# Patient Record
Sex: Male | Born: 1960 | Race: White | Hispanic: No | Marital: Married | Smoking: Never smoker
Health system: Southern US, Community
[De-identification: ages and names within clinical notes are randomized; demographics above are authoritative.]

## PROBLEM LIST (undated history)

## (undated) DIAGNOSIS — G4733 Obstructive sleep apnea (adult) (pediatric): Secondary | ICD-10-CM

## (undated) DIAGNOSIS — Z974 Presence of external hearing-aid: Secondary | ICD-10-CM

## (undated) DIAGNOSIS — Z862 Personal history of diseases of the blood and blood-forming organs and certain disorders involving the immune mechanism: Secondary | ICD-10-CM

## (undated) DIAGNOSIS — G4700 Insomnia, unspecified: Secondary | ICD-10-CM

## (undated) DIAGNOSIS — I10 Essential (primary) hypertension: Secondary | ICD-10-CM

## (undated) DIAGNOSIS — J189 Pneumonia, unspecified organism: Secondary | ICD-10-CM

## (undated) DIAGNOSIS — E119 Type 2 diabetes mellitus without complications: Secondary | ICD-10-CM

## (undated) DIAGNOSIS — K56600 Partial intestinal obstruction, unspecified as to cause: Secondary | ICD-10-CM

## (undated) DIAGNOSIS — Z789 Other specified health status: Secondary | ICD-10-CM

## (undated) DIAGNOSIS — K219 Gastro-esophageal reflux disease without esophagitis: Secondary | ICD-10-CM

## (undated) DIAGNOSIS — M1990 Unspecified osteoarthritis, unspecified site: Secondary | ICD-10-CM

## (undated) DIAGNOSIS — I4510 Unspecified right bundle-branch block: Secondary | ICD-10-CM

## (undated) DIAGNOSIS — K760 Fatty (change of) liver, not elsewhere classified: Secondary | ICD-10-CM

## (undated) DIAGNOSIS — E785 Hyperlipidemia, unspecified: Secondary | ICD-10-CM

## (undated) DIAGNOSIS — Z8719 Personal history of other diseases of the digestive system: Secondary | ICD-10-CM

## (undated) DIAGNOSIS — Z87442 Personal history of urinary calculi: Secondary | ICD-10-CM

## (undated) DIAGNOSIS — D751 Secondary polycythemia: Secondary | ICD-10-CM

## (undated) DIAGNOSIS — N400 Enlarged prostate without lower urinary tract symptoms: Secondary | ICD-10-CM

## (undated) DIAGNOSIS — G4730 Sleep apnea, unspecified: Secondary | ICD-10-CM

## (undated) DIAGNOSIS — R7989 Other specified abnormal findings of blood chemistry: Secondary | ICD-10-CM

## (undated) HISTORY — PX: WISDOM TOOTH EXTRACTION: SHX21

## (undated) HISTORY — PX: NASAL SINUS SURGERY: SHX719

## (undated) HISTORY — PX: COLONOSCOPY: SHX174

## (undated) HISTORY — PX: UPPER GASTROINTESTINAL ENDOSCOPY: SHX188

## (undated) HISTORY — DX: Personal history of diseases of the blood and blood-forming organs and certain disorders involving the immune mechanism: Z86.2

---

## 2008-10-22 ENCOUNTER — Ambulatory Visit: Payer: Self-pay | Admitting: Unknown Physician Specialty

## 2009-01-10 ENCOUNTER — Ambulatory Visit: Payer: Self-pay

## 2009-01-18 ENCOUNTER — Ambulatory Visit: Payer: Self-pay

## 2009-02-14 ENCOUNTER — Ambulatory Visit: Payer: Self-pay | Admitting: Unknown Physician Specialty

## 2009-03-29 ENCOUNTER — Ambulatory Visit: Payer: Self-pay | Admitting: Unknown Physician Specialty

## 2009-04-04 ENCOUNTER — Ambulatory Visit: Payer: Self-pay | Admitting: Unknown Physician Specialty

## 2009-11-15 ENCOUNTER — Ambulatory Visit: Payer: Self-pay | Admitting: Urology

## 2010-09-08 ENCOUNTER — Ambulatory Visit: Payer: Self-pay | Admitting: Internal Medicine

## 2010-09-12 ENCOUNTER — Ambulatory Visit: Payer: Self-pay | Admitting: Internal Medicine

## 2010-09-24 ENCOUNTER — Ambulatory Visit: Payer: Self-pay | Admitting: Internal Medicine

## 2010-10-23 ENCOUNTER — Ambulatory Visit: Payer: Self-pay | Admitting: Internal Medicine

## 2010-11-20 ENCOUNTER — Emergency Department: Payer: Self-pay | Admitting: Emergency Medicine

## 2010-11-23 ENCOUNTER — Ambulatory Visit: Payer: Self-pay | Admitting: Internal Medicine

## 2010-12-23 ENCOUNTER — Ambulatory Visit: Payer: Self-pay | Admitting: Internal Medicine

## 2011-01-30 ENCOUNTER — Ambulatory Visit: Payer: Self-pay | Admitting: Internal Medicine

## 2011-02-22 ENCOUNTER — Ambulatory Visit: Payer: Self-pay | Admitting: Internal Medicine

## 2011-03-25 ENCOUNTER — Ambulatory Visit: Payer: Self-pay | Admitting: Internal Medicine

## 2011-05-29 ENCOUNTER — Ambulatory Visit: Payer: Self-pay | Admitting: Internal Medicine

## 2011-06-25 ENCOUNTER — Ambulatory Visit: Payer: Self-pay | Admitting: Internal Medicine

## 2011-09-18 ENCOUNTER — Ambulatory Visit: Payer: Self-pay | Admitting: Internal Medicine

## 2011-09-18 LAB — IRON AND TIBC
Iron Bind.Cap.(Total): 361 ug/dL (ref 250–450)
Unbound Iron-Bind.Cap.: 230 ug/dL

## 2011-09-25 ENCOUNTER — Ambulatory Visit: Payer: Self-pay | Admitting: Internal Medicine

## 2011-11-13 LAB — URINALYSIS, COMPLETE
Blood: NEGATIVE
Leukocyte Esterase: NEGATIVE
Nitrite: NEGATIVE
Ph: 5 (ref 4.5–8.0)
Protein: NEGATIVE
RBC,UR: 1 /HPF (ref 0–5)
Squamous Epithelial: NONE SEEN

## 2011-11-13 LAB — COMPREHENSIVE METABOLIC PANEL
Alkaline Phosphatase: 68 U/L (ref 50–136)
Anion Gap: 5 — ABNORMAL LOW (ref 7–16)
Bilirubin,Total: 0.7 mg/dL (ref 0.2–1.0)
Calcium, Total: 8.9 mg/dL (ref 8.5–10.1)
Chloride: 100 mmol/L (ref 98–107)
Co2: 31 mmol/L (ref 21–32)
Creatinine: 1.16 mg/dL (ref 0.60–1.30)
EGFR (Non-African Amer.): >60
Glucose: 100 mg/dL — ABNORMAL HIGH (ref 65–99)
Osmolality: 271 (ref 275–301)
Potassium: 4.1 mmol/L (ref 3.5–5.1)
Sodium: 136 mmol/L (ref 136–145)
Total Protein: 8.1 g/dL (ref 6.4–8.2)

## 2011-11-13 LAB — CBC
HCT: 55.1 % — ABNORMAL HIGH (ref 40.0–52.0)
HGB: 18.7 g/dL — ABNORMAL HIGH (ref 13.0–18.0)
MCV: 92 fL (ref 80–100)
Platelet: 194 10*3/uL (ref 150–440)
RBC: 6.01 10*6/uL — ABNORMAL HIGH (ref 4.40–5.90)
RDW: 13.1 % (ref 11.5–14.5)
WBC: 7.7 10*3/uL (ref 3.8–10.6)

## 2011-11-14 ENCOUNTER — Inpatient Hospital Stay: Payer: Self-pay | Admitting: Surgery

## 2011-11-23 ENCOUNTER — Ambulatory Visit: Payer: Self-pay | Admitting: Surgery

## 2012-01-08 ENCOUNTER — Ambulatory Visit: Payer: Self-pay | Admitting: Internal Medicine

## 2012-01-08 LAB — IRON AND TIBC
Iron: 73 ug/dL (ref 65–175)
Unbound Iron-Bind.Cap.: 245 ug/dL

## 2012-01-08 LAB — CBC CANCER CENTER
Basophil #: 0 x10 3/mm (ref 0.0–0.1)
Basophil %: 0.5 %
Eosinophil %: 2.2 %
HGB: 16.6 g/dL (ref 13.0–18.0)
MCH: 30.6 pg (ref 26.0–34.0)
Monocyte %: 11.4 %
RBC: 5.43 10*6/uL (ref 4.40–5.90)
RDW: 13.2 % (ref 11.5–14.5)
WBC: 5.1 x10 3/mm (ref 3.8–10.6)

## 2012-01-22 ENCOUNTER — Ambulatory Visit: Payer: Self-pay

## 2012-01-22 LAB — DOT URINE DIP
Protein: NEGATIVE
Specific Gravity: >=1.03 (ref 1.003–1.030)

## 2012-01-23 ENCOUNTER — Ambulatory Visit: Payer: Self-pay | Admitting: Internal Medicine

## 2012-03-22 ENCOUNTER — Other Ambulatory Visit: Payer: Self-pay | Admitting: Orthopedic Surgery

## 2012-03-22 ENCOUNTER — Encounter (HOSPITAL_BASED_OUTPATIENT_CLINIC_OR_DEPARTMENT_OTHER): Payer: Self-pay | Admitting: *Deleted

## 2012-03-22 NOTE — Progress Notes (Signed)
To come in for ekg and bmet Called for notes from [facility] cardiology-pcp-hematology

## 2012-03-25 ENCOUNTER — Encounter (HOSPITAL_BASED_OUTPATIENT_CLINIC_OR_DEPARTMENT_OTHER): Admission: RE | Payer: Self-pay | Source: Ambulatory Visit

## 2012-03-25 ENCOUNTER — Ambulatory Visit (HOSPITAL_BASED_OUTPATIENT_CLINIC_OR_DEPARTMENT_OTHER)
Admission: RE | Admit: 2012-03-25 | Payer: BC Managed Care – PPO | Source: Ambulatory Visit | Admitting: Orthopedic Surgery

## 2012-03-25 HISTORY — DX: Other specified abnormal findings of blood chemistry: R79.89

## 2012-03-25 HISTORY — DX: Essential (primary) hypertension: I10

## 2012-03-25 HISTORY — DX: Secondary polycythemia: D75.1

## 2012-03-25 HISTORY — DX: Benign prostatic hyperplasia without lower urinary tract symptoms: N40.0

## 2012-03-25 HISTORY — DX: Sleep apnea, unspecified: G47.30

## 2012-03-25 HISTORY — DX: Gastro-esophageal reflux disease without esophagitis: K21.9

## 2012-03-25 SURGERY — EXCISION MASS
Anesthesia: Regional | Laterality: Left

## 2012-04-08 ENCOUNTER — Ambulatory Visit: Payer: Self-pay | Admitting: Internal Medicine

## 2012-04-24 ENCOUNTER — Ambulatory Visit: Payer: Self-pay | Admitting: Internal Medicine

## 2012-08-24 ENCOUNTER — Ambulatory Visit: Payer: Self-pay | Admitting: Internal Medicine

## 2012-11-20 ENCOUNTER — Emergency Department (HOSPITAL_COMMUNITY): Payer: BC Managed Care – PPO

## 2012-11-20 ENCOUNTER — Emergency Department (HOSPITAL_COMMUNITY)
Admission: EM | Admit: 2012-11-20 | Discharge: 2012-11-20 | Disposition: A | Discharge status: Home / Self Care | Payer: BC Managed Care – PPO | Attending: Emergency Medicine | Admitting: Emergency Medicine

## 2012-11-20 ENCOUNTER — Encounter (HOSPITAL_COMMUNITY): Payer: Self-pay | Admitting: *Deleted

## 2012-11-20 DIAGNOSIS — Z23 Encounter for immunization: Secondary | ICD-10-CM | POA: Insufficient documentation

## 2012-11-20 DIAGNOSIS — S61409A Unspecified open wound of unspecified hand, initial encounter: Secondary | ICD-10-CM | POA: Insufficient documentation

## 2012-11-20 DIAGNOSIS — S61432A Puncture wound without foreign body of left hand, initial encounter: Secondary | ICD-10-CM

## 2012-11-20 DIAGNOSIS — G4730 Sleep apnea, unspecified: Secondary | ICD-10-CM | POA: Insufficient documentation

## 2012-11-20 DIAGNOSIS — Z79899 Other long term (current) drug therapy: Secondary | ICD-10-CM | POA: Insufficient documentation

## 2012-11-20 DIAGNOSIS — E291 Testicular hypofunction: Secondary | ICD-10-CM | POA: Insufficient documentation

## 2012-11-20 DIAGNOSIS — W278XXA Contact with other nonpowered hand tool, initial encounter: Secondary | ICD-10-CM | POA: Insufficient documentation

## 2012-11-20 DIAGNOSIS — Z8719 Personal history of other diseases of the digestive system: Secondary | ICD-10-CM | POA: Insufficient documentation

## 2012-11-20 DIAGNOSIS — Y929 Unspecified place or not applicable: Secondary | ICD-10-CM | POA: Insufficient documentation

## 2012-11-20 DIAGNOSIS — N400 Enlarged prostate without lower urinary tract symptoms: Secondary | ICD-10-CM | POA: Insufficient documentation

## 2012-11-20 DIAGNOSIS — Z7982 Long term (current) use of aspirin: Secondary | ICD-10-CM | POA: Insufficient documentation

## 2012-11-20 DIAGNOSIS — Y9389 Activity, other specified: Secondary | ICD-10-CM | POA: Insufficient documentation

## 2012-11-20 DIAGNOSIS — Z9981 Dependence on supplemental oxygen: Secondary | ICD-10-CM | POA: Insufficient documentation

## 2012-11-20 DIAGNOSIS — Z862 Personal history of diseases of the blood and blood-forming organs and certain disorders involving the immune mechanism: Secondary | ICD-10-CM | POA: Insufficient documentation

## 2012-11-20 DIAGNOSIS — I10 Essential (primary) hypertension: Secondary | ICD-10-CM | POA: Insufficient documentation

## 2012-11-20 MED ORDER — TETANUS-DIPHTHERIA TOXOIDS TD 5-2 LFU IM INJ
0.5000 mL | INJECTION | Freq: Once | INTRAMUSCULAR | Status: AC
  Administered 2012-11-20: 0.5 mL via INTRAMUSCULAR
  Filled 2012-11-20: qty 0.5

## 2012-11-20 MED ORDER — CEPHALEXIN 250 MG PO CAPS
500.0000 mg | ORAL_CAPSULE | Freq: Once | ORAL | Status: AC
  Administered 2012-11-20: 500 mg via ORAL
  Filled 2012-11-20: qty 2

## 2012-11-20 MED ORDER — CEPHALEXIN 500 MG PO CAPS: 500.0000 mg | ORAL_CAPSULE | Freq: Four times a day (QID) | ORAL | Status: DC

## 2012-11-20 NOTE — ED Notes (Signed)
Reports cutting left hand with a screwdriver. Minimal bleeding noted at triage, last tetanus >10 yrs

## 2012-11-20 NOTE — ED Provider Notes (Signed)
History     CSN: [id number]  Arrival date & time 11/20/12  5284   First MD Initiated Contact with Patient 11/20/12 2040      Chief Complaint  Patient presents with  . Extremity Laceration    (Consider location/radiation/quality/duration/timing/severity/associated sxs/prior treatment) HPI Comments: Patient was working on some plumbing when he inadvertently stabbed.  The screwdriver into the web space between his thumb and index, finger on his left hand tetanus status is out of date patient has no pain.  That radiates to index, finger or thumb.  Minimal bleeding  The history is provided by the patient.    Past Medical History  Diagnosis Date  . BPH (benign prostatic hyperplasia)   . Low testosterone   . Hypertension   . Sleep apnea     uses a cpap nightly  . GERD (gastroesophageal reflux disease)     no meds  . Polycythemia     Past Surgical History  Procedure Laterality Date  . Upper gastrointestinal endoscopy    . Colonoscopy    . Wisdom tooth extraction      History reviewed. No pertinent family history.  History  Substance Use Topics  . Smoking status: Never Smoker   . Smokeless tobacco: Not on file  . Alcohol Use: Yes     Comment: occ      Review of Systems  Musculoskeletal: Negative for joint swelling.  Skin: Positive for wound.  All other systems reviewed and are negative.    Allergies  Codeine; Contrast media; and Erythromycin  Home Medications   Current Outpatient Rx  Name  Route  Sig  Dispense  Refill  . aspirin EC 81 MG tablet   Oral   Take 81 mg by mouth daily.         Marland Kitchen ibuprofen (ADVIL,MOTRIN) 200 MG tablet   Oral   Take 800 mg by mouth daily as needed for pain.         Marland Kitchen lisinopril (PRINIVIL,ZESTRIL) 20 MG tablet   Oral   Take 20 mg by mouth daily.         . Tamsulosin HCl (FLOMAX) 0.4 MG CAPS   Oral   Take 0.4 mg by mouth daily.          . TESTOSTERONE IM   Intramuscular   Inject 0.65 mLs into the muscle every 7  (seven) days.           BP 134/84  Pulse 82  Temp(Src) 98 F (36.7 C) (Oral)  Resp 20  SpO2 96%  Physical Exam  Vitals reviewed. Constitutional: He appears well-developed and well-nourished.  HENT:  Head: Normocephalic.  Neck: Normal range of motion.  Cardiovascular: Normal rate.   Pulmonary/Chest: Effort normal.  Musculoskeletal: Normal range of motion. He exhibits tenderness. He exhibits no edema.       Hands: Neurological: He is alert.  Skin: Skin is warm. No erythema.    ED Course  Procedures (including critical care time)  Labs Reviewed - No data to display Dg Hand Complete Left  11/20/2012  *RADIOLOGY REPORT*  Clinical Data: Laceration between first and second metacarpals. Pain.  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  Mild associated subluxation.  No fracture.  No radiopaque foreign bodies.  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  No acute bony abnormality.   Original Report Authenticated By: Charlett Nose, M.D.      1. Puncture  wound of hand, left, initial encounter       MDM   Discussed with patient.  Puncture wound care, and need for antibiotics.  Signs and symptoms to watch for to return to the emergency department or follow up with Dr. Margarite Gouge, such as pain, swelling, pus, redness, joint swelling       Arman Filter, NP 11/20/12 2154

## 2012-11-21 NOTE — ED Provider Notes (Signed)
Medical screening examination/treatment/procedure(s) were performed by non-physician practitioner and as supervising physician I was immediately available for consultation/collaboration.    Celene Kras, MD 11/21/12 [phone]

## 2012-12-02 ENCOUNTER — Ambulatory Visit: Payer: Self-pay | Admitting: Emergency Medicine

## 2012-12-02 LAB — DOT URINE DIP: Glucose,UR: NEGATIVE mg/dL (ref 0–75)

## 2012-12-08 ENCOUNTER — Ambulatory Visit: Payer: Self-pay | Admitting: Internal Medicine

## 2013-06-20 ENCOUNTER — Encounter (HOSPITAL_BASED_OUTPATIENT_CLINIC_OR_DEPARTMENT_OTHER): Payer: Self-pay | Admitting: *Deleted

## 2013-06-20 ENCOUNTER — Encounter (HOSPITAL_BASED_OUTPATIENT_CLINIC_OR_DEPARTMENT_OTHER)
Admission: RE | Disposition: A | Discharge status: Home / Self Care | Payer: Self-pay | Source: Ambulatory Visit | Attending: Orthopedic Surgery

## 2013-06-20 ENCOUNTER — Ambulatory Visit (HOSPITAL_BASED_OUTPATIENT_CLINIC_OR_DEPARTMENT_OTHER)
Admission: RE | Admit: 2013-06-20 | Discharge: 2013-06-20 | Disposition: A | Discharge status: Home / Self Care | Payer: BC Managed Care – PPO | Source: Ambulatory Visit | Attending: Orthopedic Surgery | Admitting: Orthopedic Surgery

## 2013-06-20 DIAGNOSIS — S6710XA Crushing injury of unspecified finger(s), initial encounter: Secondary | ICD-10-CM | POA: Insufficient documentation

## 2013-06-20 DIAGNOSIS — G4730 Sleep apnea, unspecified: Secondary | ICD-10-CM | POA: Insufficient documentation

## 2013-06-20 DIAGNOSIS — N400 Enlarged prostate without lower urinary tract symptoms: Secondary | ICD-10-CM | POA: Insufficient documentation

## 2013-06-20 DIAGNOSIS — I10 Essential (primary) hypertension: Secondary | ICD-10-CM | POA: Insufficient documentation

## 2013-06-20 DIAGNOSIS — S61209A Unspecified open wound of unspecified finger without damage to nail, initial encounter: Secondary | ICD-10-CM | POA: Insufficient documentation

## 2013-06-20 DIAGNOSIS — W230XXA Caught, crushed, jammed, or pinched between moving objects, initial encounter: Secondary | ICD-10-CM | POA: Insufficient documentation

## 2013-06-20 DIAGNOSIS — D45 Polycythemia vera: Secondary | ICD-10-CM | POA: Insufficient documentation

## 2013-06-20 DIAGNOSIS — K219 Gastro-esophageal reflux disease without esophagitis: Secondary | ICD-10-CM | POA: Insufficient documentation

## 2013-06-20 HISTORY — PX: I & D EXTREMITY: SHX5045

## 2013-06-20 SURGERY — MINOR IRRIGATION AND DEBRIDEMENT EXTREMITY
Anesthesia: LOCAL | Site: Finger | Laterality: Left | Wound class: Contaminated

## 2013-06-20 MED ORDER — LIDOCAINE HCL (PF) 1 % IJ SOLN
INTRAMUSCULAR | Status: AC
  Filled 2013-06-20: qty 30

## 2013-06-20 MED ORDER — BUPIVACAINE HCL (PF) 0.25 % IJ SOLN
INTRAMUSCULAR | Status: AC
  Filled 2013-06-20: qty 30

## 2013-06-20 MED ORDER — LIDOCAINE HCL 1 % IJ SOLN
INTRAMUSCULAR | Status: DC | PRN
  Administered 2013-06-20: 16:00:00 via INTRAMUSCULAR

## 2013-06-20 MED ORDER — HYDROCODONE-ACETAMINOPHEN 5-325 MG PO TABS: ORAL_TABLET | ORAL | Status: DC

## 2013-06-20 SURGICAL SUPPLY — 54 items
BAG DECANTER FOR FLEXI CONT (MISCELLANEOUS) IMPLANT
BANDAGE ELASTIC 3 VELCRO ST LF (GAUZE/BANDAGES/DRESSINGS) IMPLANT
BANDAGE GAUZE ELAST BULKY 4 IN (GAUZE/BANDAGES/DRESSINGS) IMPLANT
BANDAGE GAUZE STRT 1 STR LF (GAUZE/BANDAGES/DRESSINGS) IMPLANT
BLADE MINI RND TIP GREEN BEAV (BLADE) IMPLANT
BLADE SURG 15 STRL LF DISP TIS (BLADE) ×1 IMPLANT
BLADE SURG 15 STRL SS (BLADE) ×1
BNDG COHESIVE 1X5 TAN STRL LF (GAUZE/BANDAGES/DRESSINGS) ×2 IMPLANT
BNDG ELASTIC 2 VLCR STRL LF (GAUZE/BANDAGES/DRESSINGS) IMPLANT
BNDG ESMARK 4X9 LF (GAUZE/BANDAGES/DRESSINGS) IMPLANT
CHLORAPREP W/TINT 26ML (MISCELLANEOUS) ×2 IMPLANT
CORDS BIPOLAR (ELECTRODE) IMPLANT
COVER MAYO STAND STRL (DRAPES) ×2 IMPLANT
COVER TABLE BACK 60X90 (DRAPES) IMPLANT
CUFF TOURNIQUET SINGLE 18IN (TOURNIQUET CUFF) IMPLANT
DRAIN PENROSE 1/2X12 LTX STRL (WOUND CARE) ×2 IMPLANT
DRAPE EXTREMITY T 121X128X90 (DRAPE) ×2 IMPLANT
DRAPE SURG 17X23 STRL (DRAPES) IMPLANT
GAUZE PACKING IODOFORM 1/4X5 (PACKING) IMPLANT
GAUZE SPONGE 4X4 12PLY STRL LF (GAUZE/BANDAGES/DRESSINGS) ×4 IMPLANT
GAUZE XEROFORM 1X8 LF (GAUZE/BANDAGES/DRESSINGS) ×2 IMPLANT
GLOVE BIO SURGEON STRL SZ7 (GLOVE) ×2 IMPLANT
GLOVE BIO SURGEON STRL SZ7.5 (GLOVE) ×2 IMPLANT
GLOVE BIOGEL PI IND STRL 7.5 (GLOVE) ×1 IMPLANT
GLOVE BIOGEL PI IND STRL 8 (GLOVE) ×1 IMPLANT
GLOVE BIOGEL PI INDICATOR 7.5 (GLOVE) ×1
GLOVE BIOGEL PI INDICATOR 8 (GLOVE) ×1
GLOVE EXAM NITRILE PF MED BLUE (GLOVE) ×2 IMPLANT
GOWN BRE IMP PREV XXLGXLNG (GOWN DISPOSABLE) ×2 IMPLANT
GOWN PREVENTION PLUS XLARGE (GOWN DISPOSABLE) ×2 IMPLANT
LOOP VESSEL MAXI BLUE (MISCELLANEOUS) IMPLANT
NEEDLE 27GAX1X1/2 (NEEDLE) ×4 IMPLANT
NEEDLE HYPO 25X1 1.5 SAFETY (NEEDLE) IMPLANT
NS IRRIG 1000ML POUR BTL (IV SOLUTION) ×2 IMPLANT
PACK BASIN DAY SURGERY FS (CUSTOM PROCEDURE TRAY) ×2 IMPLANT
PAD CAST 3X4 CTTN HI CHSV (CAST SUPPLIES) IMPLANT
PADDING CAST ABS 4INX4YD NS (CAST SUPPLIES) ×1
PADDING CAST ABS COTTON 4X4 ST (CAST SUPPLIES) ×1 IMPLANT
PADDING CAST COTTON 3X4 STRL (CAST SUPPLIES)
SPLINT FINGER 5/8X3.25 (SOFTGOODS) ×1 IMPLANT
SPLINT FINGER FOAM 3 9119 05 (SOFTGOODS) ×2
SPLINT PLASTER CAST XFAST 3X15 (CAST SUPPLIES) IMPLANT
SPLINT PLASTER XTRA FASTSET 3X (CAST SUPPLIES)
SPONGE GAUZE 4X4 12PLY (GAUZE/BANDAGES/DRESSINGS) ×2 IMPLANT
STOCKINETTE 4X48 STRL (DRAPES) ×2 IMPLANT
SUT CHROMIC 6 0 G 1 (SUTURE) ×2 IMPLANT
SUT ETHILON 4 0 PS 2 18 (SUTURE) IMPLANT
SWAB COLLECTION DEVICE MRSA (MISCELLANEOUS) ×2 IMPLANT
SYR BULB 3OZ (MISCELLANEOUS) ×2 IMPLANT
SYR CONTROL 10ML LL (SYRINGE) ×4 IMPLANT
TOWEL OR 17X24 6PK STRL BLUE (TOWEL DISPOSABLE) ×4 IMPLANT
TUBE ANAEROBIC SPECIMEN COL (MISCELLANEOUS) ×2 IMPLANT
TUBE FEEDING 5FR 15 INCH (TUBING) IMPLANT
UNDERPAD 30X30 INCONTINENT (UNDERPADS AND DIAPERS) ×2 IMPLANT

## 2013-06-20 NOTE — Brief Op Note (Signed)
06/20/2013  4:50 PM  PATIENT:  [name]  52 y.o. male  PRE-OPERATIVE DIAGNOSIS:  left index finger crush injury  POST-OPERATIVE DIAGNOSIS:  left index finger crush injury  PROCEDURE:  Procedure(s): MINOR IRRIGATION AND DEBRIDEMENT LEFT INDEX FINGER  SURGEON:  Surgeon(s): Tami Ribas, MD Nicki Reaper, MD  PHYSICIAN ASSISTANT:   ASSISTANTS: none   ANESTHESIA:   Digital block 16 ml 50/50 1% lidocaine/0.25% marcaine plain  EBL:     DRAINS: none   LOCAL MEDICATIONS USED:  MARCAINE    and LIDOCAINE   SPECIMEN:  Source of Specimen:  left index  DISPOSITION OF SPECIMEN:  micro  COUNTS:  YES  TOURNIQUET:   Total Tourniquet Time Documented: area (Left) - 10 minutes Total: area (Left) - 10 minutes   DICTATION: .Other Dictation: Dictation Number [id number]  PLAN OF CARE: Discharge to home after PACU

## 2013-06-20 NOTE — H&P (Signed)
Bland Rudzinski is an 52 y.o. male.   Chief Complaint: left index infection/crush injury HPI: 52 yo male states he smashed his left index finger in a door 4 days ago.  Has noted black discoloration under nail and redness and pain dorsally.  No fevers, chills, night sweats.  Past Medical History  Diagnosis Date  . BPH (benign prostatic hyperplasia)   . Low testosterone   . Hypertension   . Sleep apnea     uses a cpap nightly  . GERD (gastroesophageal reflux disease)     no meds  . Polycythemia     Past Surgical History  Procedure Laterality Date  . Upper gastrointestinal endoscopy    . Colonoscopy    . Wisdom tooth extraction      No family history on file. Social History:  reports that he has never smoked. He does not have any smokeless tobacco history on file. He reports that he drinks alcohol. He reports that he does not use illicit drugs.  Allergies:  Allergies  Allergen Reactions  . Codeine Itching  . Contrast Media [Iodinated Diagnostic Agents] Itching  . Erythromycin     Stomach pain    No prescriptions prior to admission    No results found for this or any previous visit (from the past 48 hour(s)).  No results found.   A comprehensive review of systems was negative.  There were no vitals taken for this visit.  General appearance: alert, cooperative and appears stated age Head: Normocephalic, without obvious abnormality, atraumatic Neck: supple, symmetrical, trachea midline Resp: clear to auscultation bilaterally Cardio: regular rate and rhythm GI: non tender Extremities: intact sensation and capillary refill all digits.  +epl/fpl/io.  left index with subungual hematoma and erythema on the dorsal nail fold.  no proximal streaking.  no drainage.  ttp dorsal nail fold. Pulses: 2+ and symmetric Skin: as above Neurologic: Grossly normal Incision/Wound: Small scabbed wound on radial side index finger pad.  No erythema or drainage.  Assessment/Plan Left  index finger crush injury/infection.  Recommend I&D left index under digital block with removal nail and possibly nail bed repair.  Risks, benefits, and alternatives of surgery were discussed and the patient agrees with the plan of care.   Randeep Biondolillo R 06/20/2013, 3:46 PM

## 2013-06-20 NOTE — Op Note (Signed)
[id number] 

## 2013-06-21 NOTE — Op Note (Signed)
[name], [name] NO.:  [id number]  MEDICAL RECORD NO.:  [id number]  LOCATION:                                 FACILITY:  PHYSICIAN:  Betha Loa, MD             DATE OF BIRTH:  DATE OF PROCEDURE:  06/20/2013 DATE OF DISCHARGE:                              OPERATIVE REPORT   PREOPERATIVE DIAGNOSIS:  Left index fingertip crush injury, paronychia.  POSTOPERATIVE DIAGNOSIS:  Left index finger tip crush injury.  SURGEON:  Betha Loa, MD  ASSISTANT:  None.  ANESTHESIA:  Digital block with 16 mL 0.5 and 0.5 solution, 1% plain lidocaine, and 0.25% plain Marcaine.  ESTIMATED BLOOD LOSS:  Minimal.  COMPLICATIONS:  None.  SPECIMENS:  Cultures to micro.  TOURNIQUET TIME:  10 minutes Penrose drain.  DISPOSITION:  Stable.  INDICATIONS:  [name] is a 52 year old right-hand dominant male who states 4 days ago he smashed his right index finger tip in the door.  He had some bleeding from underneath the cuticle.  He presented to Dr. Madelon Lips today, was found to have erythema on the dorsum of the nail fold.  He was referred to me for care.  He has had no fevers, chills, or night sweats.  I discussed with [name] the nature of the injury.  I recommended going to the operating room for removal of the nail with possible nail bed repair and I and D of paronychia.  Risks, benefits, and alternatives of surgery were discussed including risk of blood loss, infection, damage to nerves, vessels, tendons, ligaments, bone; failure of surgery; need for additional surgery, complications with wound healing, continued pain, and nail deformity.  He voiced understanding of these risks and elected to proceed.  OPERATIVE COURSE:  After being identified preoperatively by myself, the patient and I agreed upon procedure and site of procedure.  Surgical site was marked.  Risks, benefits, and alternatives of surgery were reviewed and wished to proceed.  Surgical consent had  been signed.  He was transported to the operating room and placed on the operating room table in supine position with the left upper extremity on arm board. Surgical time-out was performed between the surgeons, patient, and operating room staff, and all were in agreement as to the patient, procedure, and site of procedure.  Digital block was performed with 10 mL of 0.5 and 0.5 solution, 1% plain lidocaine, and 0.25% plain Marcaine.  This was later augmented with another 6 mL to attain digital anesthesia.  The hand and forearm were prepped and sterilely draped in normal sterile orthopedic fashion.  A Penrose drain was used as a tourniquet, was up for 10 minutes total.  The nail was removed.  There was blood underneath the nail.  There was no gross purulence. Laceration of the nail bed transversely at the distal aspect of the wound was cleaned.  The dorsal nail roof was incised.  There was no gross purulence.  The wounds were copiously irrigated with sterile saline.  6-0 chromic suture was used to repair the nail bed laceration. A piece of Xeroform was then placed in the nail fold and the wound  dressed with sterile Xeroform.  It was then dressed with sterile 4 x 4 and wrapped with a Coban dressing lightly.  An AlumaFoam splint was placed and wrapped with a Coban dressing lightly as well.  The Penrose drain was removed at 10 minutes.  He tolerated the procedure well.  He was taken to recovery in stable condition.  I will see him back in the office in 3 days for postprocedure followup.  I will give him Norco 5/325 one or two p.o. q.6 hours p.r.n. pain, dispensed #30.     Betha Loa, MD     KK/MEDQ  D:  06/20/2013  T:  06/21/2013  Job:  [id number]

## 2013-06-22 ENCOUNTER — Encounter (HOSPITAL_BASED_OUTPATIENT_CLINIC_OR_DEPARTMENT_OTHER): Payer: Self-pay | Admitting: Orthopedic Surgery

## 2013-06-24 LAB — WOUND CULTURE

## 2013-06-26 LAB — ANAEROBIC CULTURE

## 2013-07-07 ENCOUNTER — Ambulatory Visit: Payer: Self-pay | Admitting: Unknown Physician Specialty

## 2013-07-11 LAB — PATHOLOGY REPORT

## 2013-11-03 ENCOUNTER — Ambulatory Visit: Payer: Self-pay | Admitting: Family Medicine

## 2013-11-03 LAB — DOT URINE DIP
Blood: NEGATIVE
GLUCOSE, UR: NEGATIVE mg/dL (ref 0–75)
Specific Gravity: 1.015 (ref 1.003–1.030)

## 2014-08-23 ENCOUNTER — Emergency Department (HOSPITAL_COMMUNITY)
Admission: EM | Admit: 2014-08-23 | Discharge: 2014-08-24 | Disposition: A | Discharge status: Home / Self Care | Payer: BC Managed Care – PPO | Attending: Emergency Medicine | Admitting: Emergency Medicine

## 2014-08-23 ENCOUNTER — Encounter (HOSPITAL_COMMUNITY): Payer: Self-pay | Admitting: *Deleted

## 2014-08-23 ENCOUNTER — Emergency Department (HOSPITAL_COMMUNITY): Payer: BC Managed Care – PPO

## 2014-08-23 DIAGNOSIS — Z87438 Personal history of other diseases of male genital organs: Secondary | ICD-10-CM | POA: Diagnosis not present

## 2014-08-23 DIAGNOSIS — Z862 Personal history of diseases of the blood and blood-forming organs and certain disorders involving the immune mechanism: Secondary | ICD-10-CM | POA: Insufficient documentation

## 2014-08-23 DIAGNOSIS — Z8719 Personal history of other diseases of the digestive system: Secondary | ICD-10-CM | POA: Insufficient documentation

## 2014-08-23 DIAGNOSIS — Z9981 Dependence on supplemental oxygen: Secondary | ICD-10-CM | POA: Diagnosis not present

## 2014-08-23 DIAGNOSIS — N390 Urinary tract infection, site not specified: Secondary | ICD-10-CM

## 2014-08-23 DIAGNOSIS — Z79899 Other long term (current) drug therapy: Secondary | ICD-10-CM | POA: Diagnosis not present

## 2014-08-23 DIAGNOSIS — Z791 Long term (current) use of non-steroidal anti-inflammatories (NSAID): Secondary | ICD-10-CM | POA: Diagnosis not present

## 2014-08-23 DIAGNOSIS — I10 Essential (primary) hypertension: Secondary | ICD-10-CM | POA: Insufficient documentation

## 2014-08-23 DIAGNOSIS — R109 Unspecified abdominal pain: Secondary | ICD-10-CM

## 2014-08-23 DIAGNOSIS — Z7982 Long term (current) use of aspirin: Secondary | ICD-10-CM | POA: Diagnosis not present

## 2014-08-23 DIAGNOSIS — G4730 Sleep apnea, unspecified: Secondary | ICD-10-CM | POA: Diagnosis not present

## 2014-08-23 DIAGNOSIS — R1032 Left lower quadrant pain: Secondary | ICD-10-CM | POA: Diagnosis present

## 2014-08-23 LAB — URINE MICROSCOPIC-ADD ON

## 2014-08-23 LAB — CBC
HCT: 49.2 % (ref 39.0–52.0)
Hemoglobin: 16.8 g/dL (ref 13.0–17.0)
MCH: 30.5 pg (ref 26.0–34.0)
MCHC: 34.1 g/dL (ref 30.0–36.0)
MCV: 89.5 fL (ref 78.0–100.0)
PLATELETS: 187 10*3/uL (ref 150–400)
RBC: 5.5 MIL/uL (ref 4.22–5.81)
RDW: 12.9 % (ref 11.5–15.5)
WBC: 12.9 10*3/uL — AB (ref 4.0–10.5)

## 2014-08-23 LAB — URINALYSIS, ROUTINE W REFLEX MICROSCOPIC
Glucose, UA: NEGATIVE mg/dL
HGB URINE DIPSTICK: NEGATIVE
Ketones, ur: NEGATIVE mg/dL
Nitrite: POSITIVE — AB
Protein, ur: NEGATIVE mg/dL
SPECIFIC GRAVITY, URINE: 1.023 (ref 1.005–1.030)
UROBILINOGEN UA: 2 mg/dL — AB (ref 0.0–1.0)
pH: 6.5 (ref 5.0–8.0)

## 2014-08-23 LAB — COMPREHENSIVE METABOLIC PANEL
ALBUMIN: 4.5 g/dL (ref 3.5–5.2)
ALK PHOS: 68 U/L (ref 39–117)
ALT: 34 U/L (ref 0–53)
AST: 30 U/L (ref 0–37)
Anion gap: 7 (ref 5–15)
BILIRUBIN TOTAL: 0.8 mg/dL (ref 0.3–1.2)
BUN: 16 mg/dL (ref 6–23)
CHLORIDE: 104 meq/L (ref 96–112)
CO2: 27 mmol/L (ref 19–32)
CREATININE: 1.23 mg/dL (ref 0.50–1.35)
Calcium: 9.2 mg/dL (ref 8.4–10.5)
GFR calc Af Amer: 77 mL/min — ABNORMAL LOW (ref >90)
GFR, EST NON AFRICAN AMERICAN: 66 mL/min — AB (ref >90)
Glucose, Bld: 103 mg/dL — ABNORMAL HIGH (ref 70–99)
POTASSIUM: 4.1 mmol/L (ref 3.5–5.1)
SODIUM: 138 mmol/L (ref 135–145)
Total Protein: 7.2 g/dL (ref 6.0–8.3)

## 2014-08-23 MED ORDER — SODIUM CHLORIDE 0.9 % IV BOLUS (SEPSIS)
1000.0000 mL | Freq: Once | INTRAVENOUS | Status: AC
  Administered 2014-08-23: 1000 mL via INTRAVENOUS

## 2014-08-23 MED ORDER — TRAMADOL HCL 50 MG PO TABS: 50.0000 mg | ORAL_TABLET | Freq: Four times a day (QID) | ORAL | Status: DC | PRN

## 2014-08-23 MED ORDER — KETOROLAC TROMETHAMINE 30 MG/ML IJ SOLN
30.0000 mg | Freq: Once | INTRAMUSCULAR | Status: AC
  Administered 2014-08-23: 30 mg via INTRAVENOUS
  Filled 2014-08-23: qty 1

## 2014-08-23 MED ORDER — CIPROFLOXACIN HCL 500 MG PO TABS: 500.0000 mg | ORAL_TABLET | Freq: Two times a day (BID) | ORAL | Status: DC

## 2014-08-23 MED ORDER — DEXTROSE 5 % IV SOLN
1.0000 g | Freq: Once | INTRAVENOUS | Status: AC
  Administered 2014-08-23: 1 g via INTRAVENOUS
  Filled 2014-08-23: qty 10

## 2014-08-23 MED ORDER — SODIUM CHLORIDE 0.9 % IV SOLN
INTRAVENOUS | Status: DC
  Administered 2014-08-23: 23:00:00 via INTRAVENOUS

## 2014-08-23 NOTE — ED Provider Notes (Signed)
CSN: [id number]     Arrival date & time 08/23/14  2008 History   First MD Initiated Contact with Patient 08/23/14 2130     Chief Complaint  Patient presents with  . Flank Pain  . Urinary Tract Infection     (Consider location/radiation/quality/duration/timing/severity/associated sxs/prior Treatment) Patient is a 53 y.o. male presenting with flank pain and urinary tract infection. The history is provided by the patient.  Flank Pain Pertinent negatives include no chest pain, no abdominal pain, no headaches and no shortness of breath.  Urinary Tract Infection Pertinent negatives include no chest pain, no abdominal pain, no headaches and no shortness of breath.  pt with remote hx kidney stones, no prior ureteral stone/renal colic, c/o acute onset left flank pain posteriorly earlier today. Pain has moved to LLQ. Constant, but waxes and wanes in intensity. Nausea. No vomiting. No diarrhea or constipation. No hx similar pain. No hx diverticulitis. Low grade fever on arrival to ED. ?mild dysuria. No scrotal or testicular pain or swelling. No penile d/c. No hx uti since childhood.       Past Medical History  Diagnosis Date  . BPH (benign prostatic hyperplasia)   . Low testosterone   . Hypertension   . Sleep apnea     uses a cpap nightly  . GERD (gastroesophageal reflux disease)     no meds  . Polycythemia    Past Surgical History  Procedure Laterality Date  . Upper gastrointestinal endoscopy    . Colonoscopy    . Wisdom tooth extraction    . I&d extremity Left 06/20/2013    Procedure: MINOR IRRIGATION AND DEBRIDEMENT LEFT INDEX FINGER;  Surgeon: Tennis Must, MD;  Location: [facility];  Service: Orthopedics;  Laterality: Left;   No family history on file. History  Substance Use Topics  . Smoking status: Never Smoker   . Smokeless tobacco: Not on file  . Alcohol Use: Yes     Comment: occ    Review of Systems  Constitutional: Negative for chills.  HENT:  Negative for sore throat.   Eyes: Negative for redness.  Respiratory: Negative for cough and shortness of breath.   Cardiovascular: Negative for chest pain.  Gastrointestinal: Negative for vomiting, abdominal pain and diarrhea.  Genitourinary: Positive for flank pain.  Musculoskeletal: Negative for back pain and neck pain.  Skin: Negative for rash.  Neurological: Negative for headaches.  Hematological: Does not bruise/bleed easily.  Psychiatric/Behavioral: Negative for confusion.      Allergies  Codeine; Contrast media; and Erythromycin  Home Medications   Prior to Admission medications   Medication Sig Start Date End Date Taking? Authorizing Provider  aspirin EC 81 MG tablet Take 81 mg by mouth daily.   Yes Historical Provider, MD  ibuprofen (ADVIL,MOTRIN) 200 MG tablet Take 400 mg by mouth daily as needed for pain.    Yes Historical Provider, MD  lisinopril (PRINIVIL,ZESTRIL) 20 MG tablet Take 20 mg by mouth daily.   Yes Historical Provider, MD  Phenazopyridine HCl (AZO TABS PO) Take 1 tablet by mouth every 4 (four) hours as needed (urinary pain).   Yes Historical Provider, MD  Tamsulosin HCl (FLOMAX) 0.4 MG CAPS Take 0.4 mg by mouth daily.    Yes Historical Provider, MD  TESTOSTERONE IM Inject 0.6 mLs into the muscle every 7 (seven) days. Sundays   Yes Historical Provider, MD  cephALEXin (KEFLEX) 500 MG capsule Take 1 capsule (500 mg total) by mouth 4 (four) times daily. Patient not taking:  Reported on 08/23/2014 11/20/12   Garald Balding, NP  HYDROcodone-acetaminophen [facility]) 5-325 MG per tablet 1-2 tabs po q6 hours prn pain Patient not taking: Reported on 08/23/2014 06/20/13   Leanora Cover, MD   BP 139/83 mmHg  Pulse 121  Temp(Src) 100.3 F (37.9 C) (Oral)  Resp 18  SpO2 96% Physical Exam  Constitutional: He is oriented to person, place, and time. He appears well-developed and well-nourished. No distress.  HENT:  Nose: Nose normal.  Eyes: Conjunctivae are normal. No  scleral icterus.  Neck: Neck supple. No tracheal deviation present.  Cardiovascular: Normal rate, regular rhythm, normal heart sounds and intact distal pulses.   No murmur heard. Pulmonary/Chest: Effort normal and breath sounds normal. No accessory muscle usage. No respiratory distress.  Abdominal: Soft. Bowel sounds are normal. He exhibits no distension and no mass. There is no tenderness. There is no rebound and no guarding.  Genitourinary:  No scrotal or testicular pain, swelling, or tenderness.  No cva tenderness  Musculoskeletal: Normal range of motion. He exhibits no edema or tenderness.  Neurological: He is alert and oriented to person, place, and time.  Skin: Skin is warm and dry. No rash noted. He is not diaphoretic.  No rash/shingles in area of pain  Psychiatric: He has a normal mood and affect.  Nursing note and vitals reviewed.   ED Course  Procedures (including critical care time) Labs Review   Results for orders placed or performed during the hospital encounter of 08/23/14  Urinalysis, Routine w reflex microscopic (if pt has temp above 100.31F)  Result Value Ref Range   Color, Urine ORANGE (A) YELLOW   APPearance CLEAR CLEAR   Specific Gravity, Urine 1.023 1.005 - 1.030   pH 6.5 5.0 - 8.0   Glucose, UA NEGATIVE NEGATIVE mg/dL   Hgb urine dipstick NEGATIVE NEGATIVE   Bilirubin Urine SMALL (A) NEGATIVE   Ketones, ur NEGATIVE NEGATIVE mg/dL   Protein, ur NEGATIVE NEGATIVE mg/dL   Urobilinogen, UA 2.0 (H) 0.0 - 1.0 mg/dL   Nitrite POSITIVE (A) NEGATIVE   Leukocytes, UA LARGE (A) NEGATIVE  CBC (if pt has a temp above 100.31F)  Result Value Ref Range   WBC 12.9 (H) 4.0 - 10.5 K/uL   RBC 5.50 4.22 - 5.81 MIL/uL   Hemoglobin 16.8 13.0 - 17.0 g/dL   HCT 49.2 39.0 - 52.0 %   MCV 89.5 78.0 - 100.0 fL   MCH 30.5 26.0 - 34.0 pg   MCHC 34.1 30.0 - 36.0 g/dL   RDW 12.9 11.5 - 15.5 %   Platelets 187 150 - 400 K/uL  Comprehensive metabolic panel (if pt has a temp above  100.31F)  Result Value Ref Range   Sodium 138 135 - 145 mmol/L   Potassium 4.1 3.5 - 5.1 mmol/L   Chloride 104 96 - 112 mEq/L   CO2 27 19 - 32 mmol/L   Glucose, Bld 103 (H) 70 - 99 mg/dL   BUN 16 6 - 23 mg/dL   Creatinine, Ser 1.23 0.50 - 1.35 mg/dL   Calcium 9.2 8.4 - 10.5 mg/dL   Total Protein 7.2 6.0 - 8.3 g/dL   Albumin 4.5 3.5 - 5.2 g/dL   AST 30 0 - 37 U/L   ALT 34 0 - 53 U/L   Alkaline Phosphatase 68 39 - 117 U/L   Total Bilirubin 0.8 0.3 - 1.2 mg/dL   GFR calc non Af Amer 66 (L) >90 mL/min   GFR calc Af Amer 77 (  L) >90 mL/min   Anion gap 7 5 - 15  Urine microscopic-add on  Result Value Ref Range   Squamous Epithelial / LPF RARE RARE   WBC, UA TOO NUMEROUS TO COUNT <3 WBC/hpf   RBC / HPF 0-2 <3 RBC/hpf   Bacteria, UA FEW (A) RARE   Ct Abdomen Pelvis Wo Contrast  08/23/2014   CLINICAL DATA:  Left flank pain  EXAM: CT ABDOMEN AND PELVIS WITHOUT CONTRAST  TECHNIQUE: Multidetector CT imaging of the abdomen and pelvis was performed following the standard protocol without IV contrast.  COMPARISON:  None.  FINDINGS: BODY WALL: Unremarkable.  LOWER CHEST: Unremarkable.  ABDOMEN/PELVIS:  Liver: No focal abnormality.  Biliary: No evidence of biliary obstruction or stone.  Pancreas: Unremarkable.  Spleen: Unremarkable.  Adrenals: Unremarkable.  Kidneys and ureters: Punctate stone in the interpolar left kidney, nonobstructive. No hydronephrosis or ureteral calculus.  Bladder: Limited evaluation due to decompressed state.  Reproductive: Mild enlargement of the prostate with minimal dystrophic calcification.  Bowel: Abnormal bowel rotation with the distal duodenum right of midline. The cecum is normally located in the right lower quadrant. This is of unlikely significance given patient a age. No obstruction. Normal appendix.  Retroperitoneum: No mass or adenopathy.  Peritoneum: No ascites or pneumoperitoneum.  Vascular: No acute abnormality.  OSSEOUS: No acute abnormalities.  IMPRESSION: 1. No  hydronephrosis or ureteral calculus. 2. Small left renal stone.   Electronically Signed   By: Jorje Guild M.D.   On: 08/23/2014 22:04       MDM   Iv ns. Labs. Ct. toradol iv.   Reviewed nursing notes and prior charts for additional history.   ua pos. u cx sent.  Rocephin iv. Ivf.  Recheck pain relieved. abd soft nt. No nv.   Pt states he has a urologist with whom he can/will follow up.  Return precautions discussed.  Pt currently appears stable for d/c.     Mirna Mires, MD 08/23/14 2321

## 2014-08-23 NOTE — Discharge Instructions (Signed)
It was our pleasure to provide your ER care today - we hope that you feel better.  Rest. Drink plenty of fluids.  Take cipro (antibiotic) as prescribed.  Take motrin or aleve as need for pain.  You may also take ultram as need for pain - no driving when taking ultram.  Your urine tests show a urine infection. A urine culture was sent the results of which will be back in 2 days - have your doctor/urologist follow up on that result then.  Follow up with your urologist in the next few days.   Return to ER if worse, new symptoms, severe pain, persistent vomiting, weak/faint, other concern.    Urinary Tract Infection Urinary tract infections (UTIs) can develop anywhere along your urinary tract. Your urinary tract is your body's drainage system for removing wastes and extra water. Your urinary tract includes two kidneys, two ureters, a bladder, and a urethra. Your kidneys are a pair of bean-shaped organs. Each kidney is about the size of your fist. They are located below your ribs, one on each side of your spine. CAUSES Infections are caused by microbes, which are microscopic organisms, including fungi, viruses, and bacteria. These organisms are so small that they can only be seen through a microscope. Bacteria are the microbes that most commonly cause UTIs. SYMPTOMS  Symptoms of UTIs may vary by age and gender of the patient and by the location of the infection. Symptoms in young women typically include a frequent and intense urge to urinate and a painful, burning feeling in the bladder or urethra during urination. Older women and men are more likely to be tired, shaky, and weak and have muscle aches and abdominal pain. A fever may mean the infection is in your kidneys. Other symptoms of a kidney infection include pain in your back or sides below the ribs, nausea, and vomiting. DIAGNOSIS To diagnose a UTI, your caregiver will ask you about your symptoms. Your caregiver also will ask to provide a  urine sample. The urine sample will be tested for bacteria and white blood cells. White blood cells are made by your body to help fight infection. TREATMENT  Typically, UTIs can be treated with medication. Because most UTIs are caused by a bacterial infection, they usually can be treated with the use of antibiotics. The choice of antibiotic and length of treatment depend on your symptoms and the type of bacteria causing your infection. HOME CARE INSTRUCTIONS  If you were prescribed antibiotics, take them exactly as your caregiver instructs you. Finish the medication even if you feel better after you have only taken some of the medication.  Drink enough water and fluids to keep your urine clear or pale yellow.  Avoid caffeine, tea, and carbonated beverages. They tend to irritate your bladder.  Empty your bladder often. Avoid holding urine for long periods of time.  Empty your bladder before and after sexual intercourse.  After a bowel movement, women should cleanse from front to back. Use each tissue only once. SEEK MEDICAL CARE IF:   You have back pain.  You develop a fever.  Your symptoms do not begin to resolve within 3 days. SEEK IMMEDIATE MEDICAL CARE IF:   You have severe back pain or lower abdominal pain.  You develop chills.  You have nausea or vomiting.  You have continued burning or discomfort with urination. MAKE SURE YOU:   Understand these instructions.  Will watch your condition.  Will get help right away if you are  not doing well or get worse. Document Released: 05/20/2005 Document Revised: 02/09/2012 Document Reviewed: 09/18/2011 [facility] Patient Information 2015 [place], [place]. This information is not intended to replace advice given to you by your health care provider. Make sure you discuss any questions you have with your health care provider.

## 2014-08-23 NOTE — ED Notes (Signed)
Pt states that he has had known kidney stones; pt states that at 3am he began to have rt sided flank pain; pt states that it has progressed and is no having left lower abd pain; pt states that he had a fever of 101 at home; pt states that he is having burning, frequency and urgency urination

## 2014-08-25 ENCOUNTER — Encounter (HOSPITAL_COMMUNITY): Payer: Self-pay | Admitting: Emergency Medicine

## 2014-08-25 ENCOUNTER — Emergency Department (HOSPITAL_COMMUNITY)
Admission: EM | Admit: 2014-08-25 | Discharge: 2014-08-25 | Disposition: A | Discharge status: Home / Self Care | Payer: BC Managed Care – PPO | Attending: Emergency Medicine | Admitting: Emergency Medicine

## 2014-08-25 DIAGNOSIS — N400 Enlarged prostate without lower urinary tract symptoms: Secondary | ICD-10-CM | POA: Diagnosis not present

## 2014-08-25 DIAGNOSIS — Z79899 Other long term (current) drug therapy: Secondary | ICD-10-CM | POA: Insufficient documentation

## 2014-08-25 DIAGNOSIS — Y9389 Activity, other specified: Secondary | ICD-10-CM | POA: Diagnosis not present

## 2014-08-25 DIAGNOSIS — Y998 Other external cause status: Secondary | ICD-10-CM | POA: Diagnosis not present

## 2014-08-25 DIAGNOSIS — Z7982 Long term (current) use of aspirin: Secondary | ICD-10-CM | POA: Insufficient documentation

## 2014-08-25 DIAGNOSIS — E291 Testicular hypofunction: Secondary | ICD-10-CM | POA: Insufficient documentation

## 2014-08-25 DIAGNOSIS — Z8719 Personal history of other diseases of the digestive system: Secondary | ICD-10-CM | POA: Diagnosis not present

## 2014-08-25 DIAGNOSIS — I10 Essential (primary) hypertension: Secondary | ICD-10-CM | POA: Insufficient documentation

## 2014-08-25 DIAGNOSIS — Z792 Long term (current) use of antibiotics: Secondary | ICD-10-CM | POA: Diagnosis not present

## 2014-08-25 DIAGNOSIS — Y9289 Other specified places as the place of occurrence of the external cause: Secondary | ICD-10-CM | POA: Insufficient documentation

## 2014-08-25 DIAGNOSIS — X58XXXA Exposure to other specified factors, initial encounter: Secondary | ICD-10-CM | POA: Diagnosis not present

## 2014-08-25 DIAGNOSIS — Z862 Personal history of diseases of the blood and blood-forming organs and certain disorders involving the immune mechanism: Secondary | ICD-10-CM | POA: Diagnosis not present

## 2014-08-25 DIAGNOSIS — G4730 Sleep apnea, unspecified: Secondary | ICD-10-CM | POA: Insufficient documentation

## 2014-08-25 DIAGNOSIS — T7840XA Allergy, unspecified, initial encounter: Secondary | ICD-10-CM

## 2014-08-25 DIAGNOSIS — T368X5A Adverse effect of other systemic antibiotics, initial encounter: Secondary | ICD-10-CM | POA: Diagnosis not present

## 2014-08-25 DIAGNOSIS — L299 Pruritus, unspecified: Secondary | ICD-10-CM | POA: Diagnosis present

## 2014-08-25 DIAGNOSIS — Z9981 Dependence on supplemental oxygen: Secondary | ICD-10-CM | POA: Diagnosis not present

## 2014-08-25 MED ORDER — PREDNISONE 10 MG PO TABS: ORAL_TABLET | ORAL | Status: DC

## 2014-08-25 MED ORDER — FAMOTIDINE 20 MG PO TABS
20.0000 mg | ORAL_TABLET | Freq: Once | ORAL | Status: AC
  Administered 2014-08-25: 20 mg via ORAL
  Filled 2014-08-25: qty 1

## 2014-08-25 MED ORDER — DIPHENHYDRAMINE HCL 25 MG PO TABS: 25.0000 mg | ORAL_TABLET | Freq: Four times a day (QID) | ORAL | Status: DC

## 2014-08-25 MED ORDER — FAMOTIDINE 20 MG PO TABS: 20.0000 mg | ORAL_TABLET | Freq: Two times a day (BID) | ORAL | Status: DC

## 2014-08-25 MED ORDER — CEPHALEXIN 500 MG PO CAPS: 500.0000 mg | ORAL_CAPSULE | Freq: Four times a day (QID) | ORAL | Status: DC

## 2014-08-25 MED ORDER — PREDNISONE 20 MG PO TABS
60.0000 mg | ORAL_TABLET | Freq: Once | ORAL | Status: AC
  Administered 2014-08-25: 60 mg via ORAL
  Filled 2014-08-25: qty 3

## 2014-08-25 MED ORDER — DIPHENHYDRAMINE HCL 25 MG PO CAPS
25.0000 mg | ORAL_CAPSULE | Freq: Once | ORAL | Status: AC
  Administered 2014-08-25: 25 mg via ORAL
  Filled 2014-08-25: qty 1

## 2014-08-25 NOTE — ED Notes (Signed)
Patient medicated, see MAR Patient continues to deny respiratory difficulty Patient in NAD

## 2014-08-25 NOTE — Discharge Instructions (Signed)
YOU WILL NEED TO LIST CIPRO AS AN ALLERGY FROM THIS TIME ON. TAKE MEDICATIONS AS PRESCRIBED AND FOLLOW UP WITH YOUR DOCTOR FOR RECHECK LATER THIS WEEK. RETURN HERE WITH ANY WORSENING SYMPTOMS OR NEW CONCERN.

## 2014-08-25 NOTE — ED Provider Notes (Signed)
CSN: [id number]     Arrival date & time 08/25/14  0401 History   First MD Initiated Contact with Patient 08/25/14 0440     Chief Complaint  Patient presents with  . Allergic Reaction     (Consider location/radiation/quality/duration/timing/severity/associated sxs/prior Treatment) HPI Comments: The patient started itching yesterday after his second dose of Cipro he was prescribed for a urinary tract infection. No SOB, no difficulty swallowing. He has had similar reactions in the past to codeine. He took a benadryl (50 mg) at home, was able to go back to bed but was awaken later by itching.   Patient is a 54 y.o. male presenting with allergic reaction. The history is provided by the patient. No language interpreter was used.  Allergic Reaction Presenting symptoms: itching   Presenting symptoms: no difficulty breathing, no difficulty swallowing and no rash   Severity:  Moderate Ineffective treatments:  Antihistamines   Past Medical History  Diagnosis Date  . BPH (benign prostatic hyperplasia)   . Low testosterone   . Hypertension   . Sleep apnea     uses a cpap nightly  . GERD (gastroesophageal reflux disease)     no meds  . Polycythemia    Past Surgical History  Procedure Laterality Date  . Upper gastrointestinal endoscopy    . Colonoscopy    . Wisdom tooth extraction    . I&d extremity Left 06/20/2013    Procedure: MINOR IRRIGATION AND DEBRIDEMENT LEFT INDEX FINGER;  Surgeon: Tennis Must, MD;  Location: [place];  Service: Orthopedics;  Laterality: Left;   History reviewed. No pertinent family history. History  Substance Use Topics  . Smoking status: Never Smoker   . Smokeless tobacco: Not on file  . Alcohol Use: Yes     Comment: occ    Review of Systems  Constitutional: Negative for fever and chills.  HENT: Negative.  Negative for trouble swallowing.   Respiratory: Negative.   Cardiovascular: Negative.   Gastrointestinal: Negative.    Musculoskeletal: Negative.   Skin: Positive for itching. Negative for rash.       See HPI.  Neurological: Negative.       Allergies  Codeine; Contrast media; Erythromycin; and Hydrocodone-acetaminophen  Home Medications   Prior to Admission medications   Medication Sig Start Date End Date Taking? Authorizing Provider  aspirin EC 81 MG tablet Take 81 mg by mouth daily.   Yes Historical Provider, MD  ciprofloxacin (CIPRO) 500 MG tablet Take 1 tablet (500 mg total) by mouth 2 (two) times daily. 08/23/14  Yes Mirna Mires, MD  ibuprofen (ADVIL,MOTRIN) 200 MG tablet Take 400 mg by mouth daily as needed for pain.    Yes Historical Provider, MD  lisinopril (PRINIVIL,ZESTRIL) 20 MG tablet Take 20 mg by mouth daily.   Yes Historical Provider, MD  Tamsulosin HCl (FLOMAX) 0.4 MG CAPS Take 0.4 mg by mouth daily.    Yes Historical Provider, MD  TESTOSTERONE IM Inject 0.6 mLs into the muscle every 7 (seven) days. Sundays   Yes Historical Provider, MD  traMADol (ULTRAM) 50 MG tablet Take 1 tablet (50 mg total) by mouth every 6 (six) hours as needed. 08/23/14  Yes Mirna Mires, MD  cephALEXin (KEFLEX) 500 MG capsule Take 1 capsule (500 mg total) by mouth 4 (four) times daily. Patient not taking: Reported on 08/23/2014 11/20/12   Garald Balding, NP  HYDROcodone-acetaminophen (NORCO) 5-325 MG per tablet 1-2 tabs po q6 hours prn pain Patient not taking: Reported  on 08/23/2014 06/20/13   Leanora Cover, MD  Phenazopyridine HCl (AZO TABS PO) Take 1 tablet by mouth every 4 (four) hours as needed (urinary pain).    Historical Provider, MD   BP 138/88 mmHg  Pulse 91  Temp(Src) 97.5 F (36.4 C) (Oral)  Resp 18  Ht 6\' 3"  (1.905 m)  Wt 220 lb (99.791 kg)  BMI 27.50 kg/m2  SpO2 99% Physical Exam  Constitutional: He is oriented to person, place, and time. He appears well-developed and well-nourished.  HENT:  Head: Normocephalic.  Neck: Normal range of motion. Neck supple.  Cardiovascular: Normal rate  and regular rhythm.   Pulmonary/Chest: Effort normal and breath sounds normal. No stridor. He has no wheezes. He has no rales.  Abdominal: Soft. Bowel sounds are normal. There is no tenderness. There is no rebound and no guarding.  Musculoskeletal: Normal range of motion.  Neurological: He is alert and oriented to person, place, and time.  Skin: Skin is warm and dry. No rash noted.  Mild swelling to upper eyelids. No rash visualized.  Psychiatric: He has a normal mood and affect.    ED Course  Procedures (including critical care time) Labs Review Labs Reviewed - No data to display  Imaging Review Ct Abdomen Pelvis Wo Contrast  08/23/2014   CLINICAL DATA:  Left flank pain  EXAM: CT ABDOMEN AND PELVIS WITHOUT CONTRAST  TECHNIQUE: Multidetector CT imaging of the abdomen and pelvis was performed following the standard protocol without IV contrast.  COMPARISON:  None.  FINDINGS: BODY WALL: Unremarkable.  LOWER CHEST: Unremarkable.  ABDOMEN/PELVIS:  Liver: No focal abnormality.  Biliary: No evidence of biliary obstruction or stone.  Pancreas: Unremarkable.  Spleen: Unremarkable.  Adrenals: Unremarkable.  Kidneys and ureters: Punctate stone in the interpolar left kidney, nonobstructive. No hydronephrosis or ureteral calculus.  Bladder: Limited evaluation due to decompressed state.  Reproductive: Mild enlargement of the prostate with minimal dystrophic calcification.  Bowel: Abnormal bowel rotation with the distal duodenum right of midline. The cecum is normally located in the right lower quadrant. This is of unlikely significance given patient a age. No obstruction. Normal appendix.  Retroperitoneum: No mass or adenopathy.  Peritoneum: No ascites or pneumoperitoneum.  Vascular: No acute abnormality.  OSSEOUS: No acute abnormalities.  IMPRESSION: 1. No hydronephrosis or ureteral calculus. 2. Small left renal stone.   Electronically Signed   By: Jorje Guild M.D.   On: 08/23/2014 22:04     EKG  Interpretation None      MDM   Final diagnoses:  None    1. Allergic reaction to medication  He is feeling better with medications. Less itching. Will change antibiotic to Keflex - urine culture still pending from 12/31 ED visit and should be reviewed. Continue steroids on taper dosing, benadryl and pepcid.     Dewaine Oats, PA-C 08/25/14 [place], MD 08/25/14 [phone]

## 2014-08-25 NOTE — ED Notes (Signed)
Patient reports relief from itching after admin of medications, see MAR Patient denies c/o pain or further needs at this time

## 2014-08-25 NOTE — ED Notes (Signed)
Pt states that he began having itching after having a second dose of Cipro yesterday, after being dx with a UTI. Pt states at 0000 he took 50 mg Benadryl PO. Pt denies rash, trouble breathing or throat irritation.

## 2014-08-25 NOTE — ED Notes (Signed)

## 2014-08-26 LAB — URINE CULTURE

## 2014-08-27 ENCOUNTER — Telehealth (HOSPITAL_BASED_OUTPATIENT_CLINIC_OR_DEPARTMENT_OTHER): Payer: Self-pay | Admitting: Emergency Medicine

## 2014-08-27 NOTE — Telephone Encounter (Signed)
Post ED Visit - Positive Culture Follow-up  Culture report reviewed by antimicrobial stewardship pharmacist: []  Wes [place], Pharm.D., BCPS [x]  Heide Guile, Pharm.D., BCPS []  Alycia Rossetti, Pharm.D., BCPS []  [place], Pharm.D., BCPS, AAHIVP []  Legrand Como, Pharm.D., BCPS, AAHIVP []  Isac Sarna, Pharm.D., BCPS  Positive urine culture E. Coli Treated with ciprofloxacin, organism sensitive to the same and no further patient follow-up is required at this time.  Hazle Nordmann 08/27/2014, 12:01 PM

## 2014-10-22 ENCOUNTER — Ambulatory Visit: Payer: Self-pay | Admitting: Physician Assistant

## 2014-12-16 NOTE — Discharge Summary (Signed)
PATIENT NAME:  [name], [name] MR#:  [id number] DATE OF BIRTH:  Apr 13, 1961  DATE OF ADMISSION:  11/14/2011 DATE OF DISCHARGE:  11/16/2011  HISTORY OF PRESENT ILLNESS This 54 year old male came in with a chief complaint that his food was not going down normally. He reported that some seven days ago before the admission he had had some distention in his abdomen, very limited appetite and developed some nausea, decreased oral intake. He had taken numerous enemas at home and some laxatives, came into the Emergency Room due to increasing abdominal distention.   PAST MEDICAL HISTORY:  1. Hypertension.  2. Sleep apnea.  3. Polycythemia. 4. Has had numerous phlebotomies.   MEDICATIONS:  1. Aspirin. 2. Cialis. 3. Flomax. 4. Lisinopril. 5. Testosterone. 6. Has taken omeprazole but not recently.   PHYSICAL EXAMINATION: LUNGS: Sounds are clear. HEART: Regular rhythm, S1 and S2.  ABDOMEN: Abdomen did not appear distended, was soft, nontender.   LABORATORY, DIAGNOSTIC AND RADIOLOGICAL DATA: CT images demonstrated distention of the stomach with contrast and old food and gas. There was some small bowel distention.   Hemoglobin 18.7, hematocrit 55.1, white blood count 7700.   HOSPITAL COURSE: He was admitted with what appeared to be a partial small bowel obstruction of undetermined etiology.   By the next day he was somewhat better, not having any full sensation, passing gas. Abdominal x-rays demonstrated dye has passed into the colon and with no sign of obstruction. He was begun on a liquid diet.   By the 25th he appeared to be making good progress, tolerating his diet satisfactorily.   FINAL DIAGNOSIS: Partial small bowel obstruction currently of undetermined etiology.   DISCHARGE INSTRUCTIONS: I discussed discharge plans and made plan for a follow-up outpatient small bowel x-ray series and follow up in the office.  ____________________________ J. Rochel Brome, MD jws:cms D: 11/29/2011  12:26:12 ET T: 11/30/2011 14:36:52 ET JOB#: [id number]  cc: Loreli Dollar, MD, <Dictator> Loreli Dollar MD ELECTRONICALLY SIGNED 12/01/2011 18:14

## 2014-12-16 NOTE — H&P (Signed)
PATIENT NAME:  [name], [name] MR#:  [id number] DATE OF BIRTH:  03-05-1961  DATE OF ADMISSION:  11/14/2011  HISTORY OF PRESENT ILLNESS: This 54 year old male came into the Emergency Room with a chief complaint that his food was not going down normally.   He was in his usual state of health until about seven days ago when his girlfriend noted that his abdomen appeared to be a distended and then four days ago he developed some periumbilical and abdominal pain, also some nausea and vomited once, did not throw up any blood, just some old food. Since that time he has had very limited appetite. He has had some minimal additional nausea, much decrease in oral intake, and two days ago it became somewhat worse and he has had no substantial oral intake over the past two days. He reports no associated chills or fever. He has had numerous enemas and also some laxatives and has had some results with the enemas and laxatives but has not felt any significant relief. He has been voiding satisfactorily. He has been ambulatory.  He came into the Emergency Room last night where he was evaluated by the Emergency Room staff and as part of his evaluation he had a CT scan which included contrast, which demonstrated some distention of the stomach and proximal small bowel with a gradual transition into more normal size distal small bowel caliber. The colon itself was not distended. He did have findings of a kidney stone.  He has been under the care of Dr. Netty Starring. He has seen Dr. Ma Hillock about polycythemia. He has seen Dr. Jacqlyn Larsen about a kidney stone.  PAST MEDICAL HISTORY: 1. Hypertension.  He has known about that for less than a year and has been on treatment and controlled. 2. He has no history of heart disease. He did have apparently some anxiety within the past year and had an echocardiogram, which was normal. 3. He reports no specific history of lung disease. 4. He has had sleep apnea and does use a CPAP  machine. 5. Polycythemia.  He has seen Dr. Ma Hillock.  He has had a number of phlebotomies and periodic check of his hemoglobin and hematocrit.  6. He does have known history of kidney stones.  7. He has had low testosterone and has received testosterone injections.  8. He reports no history of hepatitis.  PAST SURGICAL HISTORY:  1. He has had no abdominal surgeries.  2. He has had three colonoscopies and has had polyps removed.  However, on last colonoscopy three years ago there were no polyps. 3. He has had upper endoscopy in the past as well.  He has been told that he has gastroesophageal reflux.  MEDICATIONS:  1. Aspirin 81 mg daily. 2. Cialis 20 mg as needed.  3. Flomax 0.4 mg daily.  4. Lisinopril 20 mg daily.  5. Testosterone 0.7 mL IM weekly.  6. He has taken omeprazole in the past for heartburn but not recently.   ALLERGIES: Contrast dye and hydrocodone.   FAMILY HISTORY: Positive for colon cancer.   SOCIAL HISTORY: Does not smoke, rarely drinks any alcohol. He is accompanied by his daughter and his girlfriend.     REVIEW OF SYSTEMS: He reports no recent acute illness such as cough, cold, or sore throat. No sores or boils. No recent ankle edema.  He does have a headache today, asking about taking some Tylenol. He reports no visual changes. No current heartburn. No difficulty swallowing. No chest pain. No dyspnea.  He  has been fairly active as far as walking. He reports no recent urinary symptoms. No swollen glands. He is not particularly anxious at present.  Review of systems otherwise negative.   PHYSICAL EXAMINATION:  VITAL SIGNS: Temperature 98.4, pulse 93, respirations 20, blood pressure 104/66.   SKIN: Warm and dry without rash.    HEENT: Pupils equal and reactive to light. Extraocular movements are intact. Sclerae are clear. Palpebral conjunctivae red. Pharynx clear.   NECK: No palpable mass.   LUNGS: Lungs sounds are clear. No respiratory distress.   HEART: Regular  rhythm, S1 and S2 without murmur.   ABDOMEN: Does not appear distended. It soft and nontender with no palpable mass. No palpable hernia. No hepatomegaly.   EXTREMITIES: No dependent edema.   NEUROLOGIC: Awake, alert, oriented. Moving all extremities.   CLINICAL DATA: Glucose 100, BUN 10, creatinine 1.16. Liver panel normal. CBC with white blood count 7700, hemoglobin 18.7, hematocrit 55.1, platelet count 194,000. Urinalysis essentially normal.   I reviewed his CT images, which demonstrate some distention of the stomach with contrast and old food and gas. There was also small bowel distention at approximately the first half of the small bowel, which appears to be moderately distended, and there appears to be more normal size loops distally. The colon is not distended.   IMPRESSION:  1. Partial small bowel obstruction currently of undetermined etiology.  2. Polycythemia.  3. Sleep apnea. 4. History of hypertension.   RECOMMENDATIONS:  He is  being admitted to the hospital. We are going to give him IV fluids and Tylenol for his headache.  He does not appear to need any narcotic analgesics at present. I discussed the finding of partial small bowel obstruction and recommended a period of observation to see if it improves. We will plan to do a flat and upright x-ray of the abdomen tomorrow to see what the gas pattern looks like.  There may be a role for small bowel x-ray series for further evaluation. We are going to have him get his own CPAP machine to use here in the hospital.   ____________________________ J. Rochel Brome, MD jws:bjt D:  11/14/2011 12:12:03 ET         T: 11/14/2011 12:56:10 ET       JOB#: [id number]  cc: Loreli Dollar, MD, <Dictator> Dion Body, MD Loreli Dollar MD ELECTRONICALLY SIGNED 11/15/2011 12:40

## 2015-09-09 ENCOUNTER — Encounter: Payer: Self-pay | Admitting: Emergency Medicine

## 2015-09-09 ENCOUNTER — Ambulatory Visit
Admission: EM | Admit: 2015-09-09 | Discharge: 2015-09-09 | Disposition: A | Discharge status: Home / Self Care | Payer: Self-pay | Attending: Family Medicine | Admitting: Family Medicine

## 2015-09-09 DIAGNOSIS — Z024 Encounter for examination for driving license: Secondary | ICD-10-CM

## 2015-09-09 DIAGNOSIS — Z029 Encounter for administrative examinations, unspecified: Secondary | ICD-10-CM

## 2015-09-09 LAB — DEPT OF TRANSP DIPSTICK, URINE (ARMC ONLY)
Glucose, UA: NEGATIVE mg/dL
HGB URINE DIPSTICK: NEGATIVE
PROTEIN: NEGATIVE mg/dL
Specific Gravity, Urine: 1.02 (ref 1.005–1.030)

## 2015-09-09 NOTE — ED Provider Notes (Signed)
CSN: [id number]     Arrival date & time 09/09/15  1033 History   First MD Initiated Contact with Patient 09/09/15 1243     Chief Complaint  Patient presents with  . DOT Physical    (Consider location/radiation/quality/duration/timing/severity/associated sxs/prior Treatment) HPI   Patient is here for a DOT physical. He has a history of low T, hypertension, and obstructive sleep apnea for which she uses a CPAP. He states that he uses his CPAP while on the road. He has 4 machines that he uses. He states that he uses CPAP at 96% of the time.  Past Medical History  Diagnosis Date  . BPH (benign prostatic hyperplasia)   . Low testosterone   . Hypertension   . Sleep apnea     uses a cpap nightly  . GERD (gastroesophageal reflux disease)     no meds  . Polycythemia    Past Surgical History  Procedure Laterality Date  . Upper gastrointestinal endoscopy    . Colonoscopy    . Wisdom tooth extraction    . I&d extremity Left 06/20/2013    Procedure: MINOR IRRIGATION AND DEBRIDEMENT LEFT INDEX FINGER;  Surgeon: Tennis Must, MD;  Location: [facility];  Service: Orthopedics;  Laterality: Left;   History reviewed. No pertinent family history. Social History  Substance Use Topics  . Smoking status: Never Smoker   . Smokeless tobacco: None  . Alcohol Use: Yes     Comment: occ    Review of Systems  All other systems reviewed and are negative.   Allergies  Codeine; Contrast media; Erythromycin; and Hydrocodone-acetaminophen  Home Medications   Prior to Admission medications   Medication Sig Start Date End Date Taking? Authorizing Provider  aspirin EC 81 MG tablet Take 81 mg by mouth 2 (two) times daily.     Historical Provider, MD  cephALEXin (KEFLEX) 500 MG capsule Take 1 capsule (500 mg total) by mouth 4 (four) times daily. Patient not taking: Reported on 08/23/2014 11/20/12   Junius Creamer, NP  cephALEXin (KEFLEX) 500 MG capsule Take 1 capsule (500 mg total) by mouth  4 (four) times daily. 08/25/14   Charlann Lange, PA-C  ciprofloxacin (CIPRO) 500 MG tablet Take 1 tablet (500 mg total) by mouth 2 (two) times daily. 08/23/14   Lajean Saver, MD  diphenhydrAMINE (BENADRYL) 25 MG tablet Take 1 tablet (25 mg total) by mouth every 6 (six) hours. Take regularly for 3 days then as needed 08/25/14   Charlann Lange, PA-C  famotidine (PEPCID) 20 MG tablet Take 1 tablet (20 mg total) by mouth 2 (two) times daily. Take regularly for 3 days then as needed 08/25/14   Charlann Lange, PA-C  HYDROcodone-acetaminophen [facility]) 5-325 MG per tablet 1-2 tabs po q6 hours prn pain Patient not taking: Reported on 08/23/2014 06/20/13   Leanora Cover, MD  ibuprofen (ADVIL,MOTRIN) 200 MG tablet Take 400 mg by mouth daily as needed for pain.     Historical Provider, MD  lisinopril (PRINIVIL,ZESTRIL) 20 MG tablet Take 20 mg by mouth daily.    Historical Provider, MD  Phenazopyridine HCl (AZO TABS PO) Take 1 tablet by mouth every 4 (four) hours as needed (urinary pain).    Historical Provider, MD  predniSONE (DELTASONE) 10 MG tablet Take 5 tablets day 2 (day one given in ED) Take 4 tablets day 3 Take 3 tablets day 4 Take 2 tablets day 5 Take 1 tablet day 6 08/25/14   Charlann Lange, PA-C  Tamsulosin HCl [facility])  0.4 MG CAPS Take 0.4 mg by mouth daily.     Historical Provider, MD  TESTOSTERONE IM Inject 0.6 mLs into the muscle every 7 (seven) days. Sundays    Historical Provider, MD  traMADol (ULTRAM) 50 MG tablet Take 1 tablet (50 mg total) by mouth every 6 (six) hours as needed. 08/23/14   Lajean Saver, MD   Meds Ordered and Administered this Visit  Medications - No data to display  BP 120/80 mmHg  Pulse 79  Temp(Src) 97 F (36.1 C) (Tympanic)  Resp 16  Ht 6' 3.5" (1.918 m)  Wt 229 lb (103.874 kg)  BMI 28.24 kg/m2  SpO2 99% No data found.   Physical Exam  Constitutional:  Refer to DOT physical  Nursing note and vitals reviewed.   ED Course  Procedures (including critical care  time)  Labs Review Labs Reviewed  DEPT OF TRANSP DIPSTICK, URINE([facility] ONLY)    Imaging Review No results found.   Visual Acuity Review  Right Eye Distance: 20/20 corrected Left Eye Distance: 20/20 corrected Bilateral Distance: 20/15 corrected  Right Eye Near:   Left Eye Near:    Bilateral Near:         MDM   1. Driver's permit physical examination        Lorin Picket, PA-C 09/09/15 1321

## 2015-09-09 NOTE — ED Notes (Signed)
Patient here for DOT Physical.  

## 2016-07-21 ENCOUNTER — Ambulatory Visit
Admission: EM | Admit: 2016-07-21 | Discharge: 2016-07-21 | Discharge status: Absent without leave | Payer: BC Managed Care – PPO

## 2016-07-22 ENCOUNTER — Ambulatory Visit
Admission: EM | Admit: 2016-07-22 | Discharge: 2016-07-22 | Disposition: A | Discharge status: Home / Self Care | Payer: BC Managed Care – PPO | Attending: Family Medicine | Admitting: Family Medicine

## 2016-07-22 ENCOUNTER — Telehealth: Payer: Self-pay | Admitting: Emergency Medicine

## 2016-07-22 DIAGNOSIS — Z0289 Encounter for other administrative examinations: Secondary | ICD-10-CM

## 2016-07-22 LAB — DEPT OF TRANSP DIPSTICK, URINE (ARMC ONLY)
GLUCOSE, UA: 250 mg/dL — AB
Hgb urine dipstick: NEGATIVE
SPECIFIC GRAVITY, URINE: 1.025 (ref 1.005–1.030)

## 2016-07-22 NOTE — ED Provider Notes (Signed)
[facility]    CSN: [id number] Arrival date & time: 07/22/16  1129     History   Chief Complaint Chief Complaint  Patient presents with  . Commercial Driver's License Exam    HPI CILAS LEROY is a 55 y.o. male.   Patient here for DOT Physical (see scanned form)   The history is provided by the patient.    Past Medical History:  Diagnosis Date  . BPH (benign prostatic hyperplasia)   . GERD (gastroesophageal reflux disease)    no meds  . Hypertension   . Low testosterone   . Polycythemia   . Sleep apnea    uses a cpap nightly    There are no active problems to display for this patient.   Past Surgical History:  Procedure Laterality Date  . COLONOSCOPY    . I&D EXTREMITY Left 06/20/2013   Procedure: MINOR IRRIGATION AND DEBRIDEMENT LEFT INDEX FINGER;  Surgeon: Tennis Must, MD;  Location: [place];  Service: Orthopedics;  Laterality: Left;  . UPPER GASTROINTESTINAL ENDOSCOPY    . WISDOM TOOTH EXTRACTION         Home Medications    Prior to Admission medications   Medication Sig Start Date End Date Taking? Authorizing Provider  Multiple Vitamin (MULTIVITAMIN) tablet Take 1 tablet by mouth daily.   Yes Historical Provider, MD  aspirin EC 81 MG tablet Take 81 mg by mouth 2 (two) times daily.     Historical Provider, MD  cephALEXin (KEFLEX) 500 MG capsule Take 1 capsule (500 mg total) by mouth 4 (four) times daily. Patient not taking: Reported on 08/23/2014 11/20/12   Junius Creamer, NP  cephALEXin (KEFLEX) 500 MG capsule Take 1 capsule (500 mg total) by mouth 4 (four) times daily. 08/25/14   Charlann Lange, PA-C  ciprofloxacin (CIPRO) 500 MG tablet Take 1 tablet (500 mg total) by mouth 2 (two) times daily. 08/23/14   Lajean Saver, MD  diphenhydrAMINE (BENADRYL) 25 MG tablet Take 1 tablet (25 mg total) by mouth every 6 (six) hours. Take regularly for 3 days then as needed 08/25/14   Charlann Lange, PA-C  famotidine (PEPCID) 20 MG tablet  Take 1 tablet (20 mg total) by mouth 2 (two) times daily. Take regularly for 3 days then as needed 08/25/14   Charlann Lange, PA-C  HYDROcodone-acetaminophen [facility]) 5-325 MG per tablet 1-2 tabs po q6 hours prn pain Patient not taking: Reported on 08/23/2014 06/20/13   Leanora Cover, MD  ibuprofen (ADVIL,MOTRIN) 200 MG tablet Take 400 mg by mouth daily as needed for pain.     Historical Provider, MD  lisinopril (PRINIVIL,ZESTRIL) 20 MG tablet Take 20 mg by mouth daily.    Historical Provider, MD  Phenazopyridine HCl (AZO TABS PO) Take 1 tablet by mouth every 4 (four) hours as needed (urinary pain).    Historical Provider, MD  predniSONE (DELTASONE) 10 MG tablet Take 5 tablets day 2 (day one given in ED) Take 4 tablets day 3 Take 3 tablets day 4 Take 2 tablets day 5 Take 1 tablet day 6 08/25/14   Charlann Lange, PA-C  Tamsulosin HCl (FLOMAX) 0.4 MG CAPS Take 0.4 mg by mouth daily.     Historical Provider, MD  TESTOSTERONE IM Inject 0.6 mLs into the muscle every 7 (seven) days. Sundays    Historical Provider, MD  traMADol (ULTRAM) 50 MG tablet Take 1 tablet (50 mg total) by mouth every 6 (six) hours as needed. 08/23/14   Lajean Saver,  MD    Family History History reviewed. No pertinent family history.  Social History Social History  Substance Use Topics  . Smoking status: Never Smoker  . Smokeless tobacco: Never Used  . Alcohol use Yes     Comment: occ     Allergies   Codeine; Contrast media [iodinated diagnostic agents]; Erythromycin; and Hydrocodone-acetaminophen   Review of Systems Review of Systems   Physical Exam Triage Vital Signs ED Triage Vitals  Enc Vitals Group     BP 07/22/16 1308 127/69     Pulse Rate 07/22/16 1308 98     Resp 07/22/16 1308 20     Temp 07/22/16 1308 98 F (36.7 C)     Temp Source 07/22/16 1308 Oral     SpO2 07/22/16 1308 100 %     Weight 07/22/16 1314 233 lb (105.7 kg)     Height 07/22/16 1314 6\' 3"  (1.905 m)     Head Circumference --      Peak  Flow --      Pain Score 07/22/16 1404 0     Pain Loc --      Pain Edu? --      Excl. in [place]? --    No data found.   Updated Vital Signs BP 127/69 (BP Location: Right Arm)   Pulse 98   Temp 98 F (36.7 C) (Oral)   Resp 20   Ht 6\' 3"  (1.905 m)   Wt 233 lb (105.7 kg)   SpO2 100%   BMI 29.12 kg/m   Visual Acuity Right Eye Distance:   Left Eye Distance:   Bilateral Distance:    Right Eye Near:   Left Eye Near:    Bilateral Near:     Physical Exam   UC Treatments / Results  Labs (all labs ordered are listed, but only abnormal results are displayed) Labs Reviewed  DEPT OF TRANSP DIPSTICK, URINE([facility] ONLY) - Abnormal; Notable for the following:       Result Value   Protein, ur TRACE (*)    Glucose, UA 250 (*)    All other components within normal limits    EKG  EKG Interpretation None       Radiology No results found.  Procedures Procedures (including critical care time)  Medications Ordered in UC Medications - No data to display   Initial Impression / Assessment and Plan / UC Course  I have reviewed the triage vital signs and the nursing notes.  Pertinent labs & imaging results that were available during my care of the patient were reviewed by me and considered in my medical decision making (see chart for details).  Clinical Course       Final Clinical Impressions(s) / UC Diagnoses   Final diagnoses:  Encounter for examination required by Department of Transportation (DOT)    New Prescriptions Discharge Medication List as of 07/22/2016  2:04 PM     DOT Physical (medically qualified; see scanned form)   Norval Gable, MD 07/22/16 1409

## 2016-07-22 NOTE — ED Triage Notes (Signed)
DOT physical. 

## 2016-07-22 NOTE — Telephone Encounter (Signed)
Patient notified that his DOT paperwork is ready to be signed and picked up.  Patient states that he will come by on Friday to pick up his paperwork.

## 2017-12-17 DIAGNOSIS — Z886 Allergy status to analgesic agent status: Secondary | ICD-10-CM

## 2017-12-17 DIAGNOSIS — Z91041 Radiographic dye allergy status: Secondary | ICD-10-CM

## 2017-12-17 DIAGNOSIS — K56600 Partial intestinal obstruction, unspecified as to cause: Principal | ICD-10-CM | POA: Diagnosis present

## 2017-12-17 DIAGNOSIS — Z8719 Personal history of other diseases of the digestive system: Secondary | ICD-10-CM

## 2017-12-17 DIAGNOSIS — G4730 Sleep apnea, unspecified: Secondary | ICD-10-CM | POA: Diagnosis present

## 2017-12-17 DIAGNOSIS — Z7982 Long term (current) use of aspirin: Secondary | ICD-10-CM

## 2017-12-17 DIAGNOSIS — Z79890 Hormone replacement therapy (postmenopausal): Secondary | ICD-10-CM

## 2017-12-17 DIAGNOSIS — Z79899 Other long term (current) drug therapy: Secondary | ICD-10-CM

## 2017-12-17 DIAGNOSIS — Z885 Allergy status to narcotic agent status: Secondary | ICD-10-CM

## 2017-12-17 DIAGNOSIS — I10 Essential (primary) hypertension: Secondary | ICD-10-CM | POA: Diagnosis present

## 2017-12-17 DIAGNOSIS — D751 Secondary polycythemia: Secondary | ICD-10-CM | POA: Diagnosis present

## 2017-12-17 DIAGNOSIS — E291 Testicular hypofunction: Secondary | ICD-10-CM | POA: Diagnosis present

## 2017-12-17 DIAGNOSIS — Z9989 Dependence on other enabling machines and devices: Secondary | ICD-10-CM

## 2017-12-17 DIAGNOSIS — Z881 Allergy status to other antibiotic agents status: Secondary | ICD-10-CM

## 2017-12-17 DIAGNOSIS — N400 Enlarged prostate without lower urinary tract symptoms: Secondary | ICD-10-CM | POA: Diagnosis present

## 2017-12-18 ENCOUNTER — Inpatient Hospital Stay (HOSPITAL_COMMUNITY)
Admission: EM | Admit: 2017-12-18 | Discharge: 2017-12-20 | DRG: 390 | Disposition: A | Discharge status: Home / Self Care | Payer: BLUE CROSS/BLUE SHIELD | Attending: Internal Medicine | Admitting: Internal Medicine

## 2017-12-18 ENCOUNTER — Emergency Department (HOSPITAL_COMMUNITY): Payer: BLUE CROSS/BLUE SHIELD

## 2017-12-18 ENCOUNTER — Other Ambulatory Visit: Payer: Self-pay

## 2017-12-18 ENCOUNTER — Encounter (HOSPITAL_COMMUNITY): Payer: Self-pay | Admitting: Emergency Medicine

## 2017-12-18 DIAGNOSIS — I10 Essential (primary) hypertension: Secondary | ICD-10-CM | POA: Diagnosis not present

## 2017-12-18 DIAGNOSIS — K56600 Partial intestinal obstruction, unspecified as to cause: Principal | ICD-10-CM

## 2017-12-18 DIAGNOSIS — R7989 Other specified abnormal findings of blood chemistry: Secondary | ICD-10-CM | POA: Diagnosis not present

## 2017-12-18 DIAGNOSIS — K56609 Unspecified intestinal obstruction, unspecified as to partial versus complete obstruction: Secondary | ICD-10-CM

## 2017-12-18 LAB — COMPREHENSIVE METABOLIC PANEL
ALT: 33 U/L (ref 17–63)
AST: 24 U/L (ref 15–41)
Albumin: 4 g/dL (ref 3.5–5.0)
Alkaline Phosphatase: 56 U/L (ref 38–126)
Anion gap: 11 (ref 5–15)
BILIRUBIN TOTAL: 1.4 mg/dL — AB (ref 0.3–1.2)
BUN: 20 mg/dL (ref 6–20)
CALCIUM: 8.7 mg/dL — AB (ref 8.9–10.3)
CO2: 27 mmol/L (ref 22–32)
CREATININE: 1.06 mg/dL (ref 0.61–1.24)
Chloride: 102 mmol/L (ref 101–111)
Glucose, Bld: 134 mg/dL — ABNORMAL HIGH (ref 65–99)
Potassium: 4.2 mmol/L (ref 3.5–5.1)
Sodium: 140 mmol/L (ref 135–145)
TOTAL PROTEIN: 6.6 g/dL (ref 6.5–8.1)

## 2017-12-18 LAB — URINALYSIS, ROUTINE W REFLEX MICROSCOPIC
BILIRUBIN URINE: NEGATIVE
Bacteria, UA: NONE SEEN
GLUCOSE, UA: NEGATIVE mg/dL
Hgb urine dipstick: NEGATIVE
KETONES UR: 80 mg/dL — AB
Leukocytes, UA: NEGATIVE
Nitrite: NEGATIVE
PH: 5 (ref 5.0–8.0)
Protein, ur: 100 mg/dL — AB
Specific Gravity, Urine: 1.04 — ABNORMAL HIGH (ref 1.005–1.030)

## 2017-12-18 LAB — CBC
HCT: 50.9 % (ref 39.0–52.0)
Hemoglobin: 18 g/dL — ABNORMAL HIGH (ref 13.0–17.0)
MCH: 32.1 pg (ref 26.0–34.0)
MCHC: 35.4 g/dL (ref 30.0–36.0)
MCV: 90.9 fL (ref 78.0–100.0)
PLATELETS: 147 10*3/uL — AB (ref 150–400)
RBC: 5.6 MIL/uL (ref 4.22–5.81)
RDW: 13.8 % (ref 11.5–15.5)
WBC: 7.3 10*3/uL (ref 4.0–10.5)

## 2017-12-18 LAB — LIPASE, BLOOD: LIPASE: 24 U/L (ref 11–51)

## 2017-12-18 MED ORDER — SODIUM CHLORIDE 0.9 % IV BOLUS
1000.0000 mL | Freq: Once | INTRAVENOUS | Status: AC
  Administered 2017-12-18: 1000 mL via INTRAVENOUS

## 2017-12-18 MED ORDER — HEPARIN SODIUM (PORCINE) 5000 UNIT/ML IJ SOLN
5000.0000 [IU] | Freq: Three times a day (TID) | INTRAMUSCULAR | Status: DC
  Administered 2017-12-18 – 2017-12-20 (×6): 5000 [IU] via SUBCUTANEOUS
  Filled 2017-12-18 (×7): qty 1

## 2017-12-18 MED ORDER — MORPHINE SULFATE (PF) 4 MG/ML IV SOLN
4.0000 mg | Freq: Once | INTRAVENOUS | Status: AC
  Administered 2017-12-18: 4 mg via INTRAVENOUS
  Filled 2017-12-18: qty 1

## 2017-12-18 MED ORDER — ONDANSETRON HCL 4 MG/2ML IJ SOLN: 4.0000 mg | Freq: Four times a day (QID) | INTRAMUSCULAR | Status: DC | PRN

## 2017-12-18 MED ORDER — ONDANSETRON HCL 4 MG/2ML IJ SOLN
4.0000 mg | Freq: Once | INTRAMUSCULAR | Status: AC
  Administered 2017-12-18: 4 mg via INTRAVENOUS
  Filled 2017-12-18: qty 2

## 2017-12-18 MED ORDER — HYDRALAZINE HCL 20 MG/ML IJ SOLN: 5.0000 mg | Freq: Four times a day (QID) | INTRAMUSCULAR | Status: DC | PRN

## 2017-12-18 MED ORDER — MORPHINE SULFATE (PF) 2 MG/ML IV SOLN
2.0000 mg | INTRAVENOUS | Status: DC | PRN
  Administered 2017-12-18 – 2017-12-19 (×3): 2 mg via INTRAVENOUS
  Filled 2017-12-18 (×3): qty 1

## 2017-12-18 MED ORDER — ORAL CARE MOUTH RINSE
15.0000 mL | Freq: Two times a day (BID) | OROMUCOSAL | Status: DC
  Administered 2017-12-18 – 2017-12-19 (×3): 15 mL via OROMUCOSAL

## 2017-12-18 MED ORDER — KCL IN DEXTROSE-NACL 20-5-0.45 MEQ/L-%-% IV SOLN
INTRAVENOUS | Status: DC
  Administered 2017-12-18 – 2017-12-19 (×4): via INTRAVENOUS
  Filled 2017-12-18 (×5): qty 1000

## 2017-12-18 MED ORDER — ONDANSETRON HCL 4 MG PO TABS
4.0000 mg | ORAL_TABLET | Freq: Four times a day (QID) | ORAL | Status: DC | PRN
Start: 2017-12-18 — End: 2017-12-20

## 2017-12-18 MED ORDER — ONDANSETRON 4 MG PO TBDP: 4.0000 mg | ORAL_TABLET | Freq: Once | ORAL | Status: DC | PRN

## 2017-12-18 NOTE — ED Provider Notes (Signed)
[facility] DEPT Provider Note   CSN: [id number] Arrival date & time: 12/17/17  2334     History   Chief Complaint Chief Complaint  Patient presents with  . Nausea  . Emesis  . Diarrhea    HPI [name] is a 57 y.o. male.  HPI  [name] is a 57 year old male with a history of hypertension, GERD and polycythemia who presents to the emergency department for evaluation of vomiting, diarrhea and generalized abdominal pain.  Patient reports that his symptoms began yesterday morning.  States that he had about 20 episodes of nonbloody diarrhea, but has not had bowel movement since 10AM yesterday.  Reports he also had about 6 episodes of vomiting yesterday, last episode of emesis around 10:30 PM last night.  Reports that he has not vomited yet today, but he has also not had anything to drink.  He also reports generalized abdominal pain.  At this time he states that his pain is mild and feels "sore" in nature.  He also reports having a measured fever of 100.1 at home yesterday.  He tried taking Tylenol and ibuprofen, but vomited that medicine yesterday shortly after taking.  He also reports generalized body aching and feels lightheaded when he walks. He is concerned because he has had a bowel obstruction in the past for which she was hospitalized for 3 days.  Denies prior abdominal surgeries. Denies cough, congestion, sore throat, chest pain, sob, hematochezia, melena, dysuria, urinary frequency, numbness, weakness, syncope.   Past Medical History:  Diagnosis Date  . BPH (benign prostatic hyperplasia)   . GERD (gastroesophageal reflux disease)    no meds  . Hypertension   . Low testosterone   . Polycythemia   . Sleep apnea    uses a cpap nightly    There are no active problems to display for this patient.   Past Surgical History:  Procedure Laterality Date  . COLONOSCOPY    . I&D EXTREMITY Left 06/20/2013   Procedure: MINOR IRRIGATION AND  DEBRIDEMENT LEFT INDEX FINGER;  Surgeon: Tennis Must, MD;  Location: [place];  Service: Orthopedics;  Laterality: Left;  . UPPER GASTROINTESTINAL ENDOSCOPY    . WISDOM TOOTH EXTRACTION          Home Medications    Prior to Admission medications   Medication Sig Start Date End Date Taking? Authorizing Provider  aspirin EC 81 MG tablet Take 81 mg by mouth 2 (two) times daily.     [provider]  cephALEXin (KEFLEX) 500 MG capsule Take 1 capsule (500 mg total) by mouth 4 (four) times daily. Patient not taking: Reported on 08/23/2014 11/20/12   Junius Creamer, NP  cephALEXin (KEFLEX) 500 MG capsule Take 1 capsule (500 mg total) by mouth 4 (four) times daily. 08/25/14   Charlann Lange, PA-C  ciprofloxacin (CIPRO) 500 MG tablet Take 1 tablet (500 mg total) by mouth 2 (two) times daily. 08/23/14   Lajean Saver, MD  diphenhydrAMINE (BENADRYL) 25 MG tablet Take 1 tablet (25 mg total) by mouth every 6 (six) hours. Take regularly for 3 days then as needed 08/25/14   Charlann Lange, PA-C  famotidine (PEPCID) 20 MG tablet Take 1 tablet (20 mg total) by mouth 2 (two) times daily. Take regularly for 3 days then as needed 08/25/14   Charlann Lange, PA-C  HYDROcodone-acetaminophen [facility]) 5-325 MG per tablet 1-2 tabs po q6 hours prn pain Patient not taking: Reported on 08/23/2014 06/20/13   Fredna Dow,  Lennette Bihari, MD  ibuprofen (ADVIL,MOTRIN) 200 MG tablet Take 400 mg by mouth daily as needed for pain.     [provider]  lisinopril (PRINIVIL,ZESTRIL) 20 MG tablet Take 20 mg by mouth daily.    [provider]  Multiple Vitamin (MULTIVITAMIN) tablet Take 1 tablet by mouth daily.    [provider]  Phenazopyridine HCl (AZO TABS PO) Take 1 tablet by mouth every 4 (four) hours as needed (urinary pain).    [provider]  predniSONE (DELTASONE) 10 MG tablet Take 5 tablets day 2 (day one given in ED) Take 4 tablets day 3 Take 3 tablets day 4 Take 2 tablets day  5 Take 1 tablet day 6 08/25/14   Charlann Lange, PA-C  Tamsulosin HCl (FLOMAX) 0.4 MG CAPS Take 0.4 mg by mouth daily.     [provider]  TESTOSTERONE IM Inject 0.6 mLs into the muscle every 7 (seven) days. Sundays    [provider]  traMADol (ULTRAM) 50 MG tablet Take 1 tablet (50 mg total) by mouth every 6 (six) hours as needed. 08/23/14   Lajean Saver, MD    Family History History reviewed. No pertinent family history.  Social History Social History   Tobacco Use  . Smoking status: Never Smoker  . Smokeless tobacco: Never Used  Substance Use Topics  . Alcohol use: Yes    Comment: occ  . Drug use: No     Allergies   Codeine; Contrast media [iodinated diagnostic agents]; Erythromycin; and Hydrocodone-acetaminophen   Review of Systems Review of Systems  Constitutional: Positive for chills, fatigue and fever.  HENT: Negative for congestion, rhinorrhea and sore throat.   Eyes: Negative for visual disturbance.  Respiratory: Negative for shortness of breath.   Cardiovascular: Negative for chest pain.  Gastrointestinal: Positive for abdominal pain, diarrhea, nausea and vomiting. Negative for blood in stool.  Endocrine: Negative for polyuria.  Genitourinary: Negative for difficulty urinating, dysuria, flank pain and testicular pain.  Musculoskeletal: Negative for back pain and gait problem.  Skin: Negative for rash.  Neurological: Positive for light-headedness. Negative for weakness and numbness.  Psychiatric/Behavioral: Negative for agitation.     Physical Exam Updated Vital Signs BP 128/81 (BP Location: Left Arm)   Pulse 94   Temp 98.3 F (36.8 C) (Oral)   Resp 18   Ht 6\' 3"  (1.905 m)   Wt 99.8 kg (220 lb)   SpO2 98%   BMI 27.50 kg/m   Physical Exam  Constitutional: He is oriented to person, place, and time. He appears well-developed and well-nourished. No distress.  Sitting at bedside in no apparent distress, nontoxic-appearing.  HENT:    Head: Normocephalic and atraumatic.  Mouth/Throat: Oropharynx is clear and moist. No oropharyngeal exudate.  Mucous memories moist.  Eyes: Pupils are equal, round, and reactive to light. Conjunctivae are normal. Right eye exhibits no discharge. Left eye exhibits no discharge.  Neck: Normal range of motion. Neck supple.  Cardiovascular: Normal rate, regular rhythm and intact distal pulses. Exam reveals no friction rub.  No murmur heard. Pulmonary/Chest: Effort normal and breath sounds normal. No stridor. No respiratory distress. He has no wheezes. He has no rales.  Abdominal:  Abdomen soft and nondistended.  He is generally tender to palpation in all quadrants and over the umbilicus.  No specific point tenderness.  No guarding or rigidity.  No rebound tenderness.  Negative Murphy's sign.  Negative McBurney's point.  Musculoskeletal: Normal range of motion.  Neurological: He is alert and  oriented to person, place, and time. Coordination normal.  Skin: Skin is warm and dry. Capillary refill takes less than 2 seconds. He is not diaphoretic.  Psychiatric: He has a normal mood and affect. His behavior is normal.  Nursing note and vitals reviewed.    ED Treatments / Results  Labs (all labs ordered are listed, but only abnormal results are displayed) Labs Reviewed  COMPREHENSIVE METABOLIC PANEL - Abnormal; Notable for the following components:      Result Value   Glucose, Bld 134 (*)    Calcium 8.7 (*)    Total Bilirubin 1.4 (*)    All other components within normal limits  CBC - Abnormal; Notable for the following components:   Hemoglobin 18.0 (*)    Platelets 147 (*)    All other components within normal limits  URINALYSIS, ROUTINE W REFLEX MICROSCOPIC - Abnormal; Notable for the following components:   Color, Urine AMBER (*)    Specific Gravity, Urine 1.040 (*)    Ketones, ur 80 (*)    Protein, ur 100 (*)    All other components within normal limits  LIPASE, BLOOD     EKG None  Radiology Ct Abdomen Pelvis Wo Contrast  Result Date: 12/18/2017 CLINICAL DATA:  Nausea and vomiting. This began 2 days ago. History of bowel obstruction. EXAM: CT ABDOMEN AND PELVIS WITHOUT CONTRAST TECHNIQUE: Multidetector CT imaging of the abdomen and pelvis was performed following the standard protocol without IV contrast. COMPARISON:  CT 08/23/2014.  Small bowel series 11/23/2011. FINDINGS: Lower chest: There is asymmetric opacity RIGHT middle lobe which appears acute, query aspiration pneumonia. Hepatobiliary: No focal liver abnormality is seen. No gallstones, gallbladder wall thickening, or biliary dilatation. Pancreas: Unremarkable. No pancreatic ductal dilatation or surrounding inflammatory changes. Spleen: Mild splenomegaly. Adrenals/Urinary Tract: No hydronephrosis or visible renal mass. 2 mm non-obstructing calculus LEFT mid renal collecting system. Bladder unremarkable. Stomach/Bowel: Partial, small bowel obstruction, with moderately distended loops proximally. Transition zone difficult to identify due to the lack of oral contrast. No colonic distention, or colonic inflammatory change. There is no appendiceal inflammation. 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 Fourth duodenum and ligament of Treitz appears to be RIGHT of midline. Vascular/Lymphatic: No significant vascular findings are present. No enlarged abdominal or pelvic lymph nodes. Reproductive: Prostate is unremarkable. Other: No ascites.  Umbilical hernia containing only fat. Musculoskeletal: Lumbar spondylosis. IMPRESSION: Partial small bowel obstruction, transition zone difficult to identify due to lack of oral contrast. No abdominopelvic ascites or free air. Partial malrotation, see previous small bowel series. Nonobstructing LEFT renal calculus. Mild splenomegaly. Query RIGHT middle lobe pneumonia. Electronically Signed   By: Staci Righter M.D.   On: 12/18/2017 08:20     Procedures Procedures (including critical care time)  Medications Ordered in ED Medications  morphine 2 MG/ML injection 2 mg (2 mg Intravenous Given 12/18/17 1602)  heparin injection 5,000 Units (5,000 Units Subcutaneous Given 12/18/17 1506)  dextrose 5 % and 0.45 % NaCl with KCl 20 mEq/L infusion ( Intravenous New Bag/Given 12/18/17 1228)  ondansetron (ZOFRAN) tablet 4 mg (has no administration in time range)    Or  ondansetron (ZOFRAN) injection 4 mg (has no administration in time range)  hydrALAZINE (APRESOLINE) injection 5 mg (has no administration in time range)  sodium chloride 0.9 % bolus 1,000 mL (0 mLs Intravenous Stopped 12/18/17 0727)  ondansetron (ZOFRAN) injection 4 mg (4 mg Intravenous Given 12/18/17 0630)  morphine 4 MG/ML injection 4 mg (4  mg Intravenous Given 12/18/17 0940)     Initial Impression / Assessment and Plan / ED Course  I have reviewed the triage vital signs and the nursing notes.  Pertinent labs & imaging results that were available during my care of the patient were reviewed by me and considered in my medical decision making (see chart for details).    CT abdomen/pelvis reveals partial small bowel obstruction, no free air or ascites.  His vital signs are stable.  Labs reviewed.  CBC unremarkable, no leukocytosis.  CMP without any major electrolyte abnormalities, creatinine WNL and liver enzymes normal.  Lipase negative.  UA without evidence of infection.  Patient has been placed on n.p.o. diet since presentation.  No emergent surgical consult needed at this time given obstruction is partial. Discussed this patient with hospitalist Dr. Wendee Beavers who will admit this patient to medicine service. Patient informed.   Final Clinical Impressions(s) / ED Diagnoses   Final diagnoses:  None    ED Discharge Orders    None       Bernarda Caffey 12/18/17 1644    Veryl Speak, MD 12/18/17 2320

## 2017-12-18 NOTE — H&P (Signed)
History and Physical    [name] QIH:[id number] DOB: [birth year] DOA: 12/18/2017  PCP: Dion Body, MD  Patient coming from: home   Chief Complaint: nausea and emesis  HPI: [name] is a 57 y.o. male with medical history significant of htn, polycythemia, low testosterone, hiatal hernia, and prior history of SBO presenting with 2-3 day complaint of nausea and emesis. Began insidiously and has persisted. Eating makes it worse and he reports emesis after eating meals. Drinking water has also been problematic per patient. Not eating or drinking fluids makes it better. Pt states on his dad's side he has a strong family history of colon cancer. He gets a colonoscopy every 4 years. Last one he had a few polyps removed otherwise reports no other abnormalities. Pt reports no symptoms suspicious for pneumonia: denies any sob, cough, productive sputum or fevers.   ED Course: Pt had CT scan which reported partial SBO. He was given pain medication and antiemetics and we were consulted for further evaluation and recommendations.   Review of Systems: As per HPI otherwise 10 point review of systems negative.    Past Medical History:  Diagnosis Date  . BPH (benign prostatic hyperplasia)   . GERD (gastroesophageal reflux disease)    no meds  . Hypertension   . Low testosterone   . Polycythemia   . Sleep apnea    uses a cpap nightly    Past Surgical History:  Procedure Laterality Date  . COLONOSCOPY    . I&D EXTREMITY Left 06/20/2013   Procedure: MINOR IRRIGATION AND DEBRIDEMENT LEFT INDEX FINGER;  Surgeon: Tennis Must, MD;  Location: [facility];  Service: Orthopedics;  Laterality: Left;  . UPPER GASTROINTESTINAL ENDOSCOPY    . WISDOM TOOTH EXTRACTION       reports that he has never smoked. He has never used smokeless tobacco. He reports that he drinks alcohol. He reports that he does not use drugs.  Allergies  Allergen Reactions  . Codeine Itching  .  Contrast Media [Iodinated Diagnostic Agents] Itching  . Erythromycin     Stomach pain  . Hydrocodone-Acetaminophen Itching    History reviewed.   Family history of  - Colon cancer on fathers side.    Prior to Admission medications   Medication Sig Start Date End Date Taking? Authorizing Provider  aspirin EC 81 MG tablet Take 162 mg by mouth daily.    Yes [provider]  cetirizine (ZYRTEC) 10 MG tablet Take 10 mg by mouth daily.   Yes [provider]  ibuprofen (ADVIL,MOTRIN) 200 MG tablet Take 400 mg by mouth daily as needed for pain.    Yes [provider]  lisinopril (PRINIVIL,ZESTRIL) 20 MG tablet Take 20 mg by mouth daily.   Yes [provider]  Multiple Vitamin (MULTIVITAMIN) tablet Take 1 tablet by mouth daily.   Yes [provider]  Tamsulosin HCl (FLOMAX) 0.4 MG CAPS Take 0.4 mg by mouth daily.    Yes [provider]  testosterone cypionate (DEPOTESTOSTERONE CYPIONATE) 200 MG/ML injection Inject 0.6 mLs into the muscle once a week. Saturdays 09/12/17  Yes [provider]    Physical Exam: Vitals:   12/18/17 0040 12/18/17 0141 12/18/17 0143 12/18/17 0632  BP: 128/81   (!) 141/86  Pulse: 94   92  Resp: 18   19  Temp: 98.3 F (36.8 C)   98.5 F (36.9 C)  TempSrc: Oral   Oral  SpO2: 98%  98% 99%  Weight:  99.8 kg (220 lb)    Height:  6\' 3"  (1.905 m)      Constitutional: NAD, calm, comfortable Vitals:   12/18/17 0040 12/18/17 0141 12/18/17 0143 12/18/17 0632  BP: 128/81   (!) 141/86  Pulse: 94   92  Resp: 18   19  Temp: 98.3 F (36.8 C)   98.5 F (36.9 C)  TempSrc: Oral   Oral  SpO2: 98%  98% 99%  Weight:  99.8 kg (220 lb)    Height:  6\' 3"  (1.905 m)     Eyes: PERRL, lids and conjunctivae normal ENMT: Mucous membranes are moist. Posterior pharynx clear of any exudate or lesions.Normal dentition.  Neck: normal, supple, no masses, no thyromegaly Respiratory: clear to auscultation bilaterally, no  wheezing, no crackles. Normal respiratory effort. No accessory muscle use.  Cardiovascular: Regular rate and rhythm, no murmurs / rubs / gallops. No extremity edema. 2+ pedal pulses. No carotid bruits.  Abdomen: non tender, + distension, no guarding, hypoactive bowel sounds Musculoskeletal: no clubbing / cyanosis. No joint deformity upper and lower extremities. Good ROM, no contractures. Normal muscle tone.  Skin: no rashes, lesions, ulcers. No induration Neurologic: answers questions appropriately, moves extremities equally. Sensation intact, Strength 5/5 in all 4.  Psychiatric: Normal judgment and insight. Alert and oriented x 3. Normal mood.    Labs on Admission: I have personally reviewed following labs and imaging studies  CBC: Recent Labs  Lab 12/18/17 0150  WBC 7.3  HGB 18.0*  HCT 50.9  MCV 90.9  PLT 884*   Basic Metabolic Panel: Recent Labs  Lab 12/18/17 0150  NA 140  K 4.2  CL 102  CO2 27  GLUCOSE 134*  BUN 20  CREATININE 1.06  CALCIUM 8.7*   GFR: Estimated Creatinine Clearance: 94.1 mL/min (by C-G formula based on SCr of 1.06 mg/dL). Liver Function Tests: Recent Labs  Lab 12/18/17 0150  AST 24  ALT 33  ALKPHOS 56  BILITOT 1.4*  PROT 6.6  ALBUMIN 4.0   Recent Labs  Lab 12/18/17 0150  LIPASE 24   No results for input(s): AMMONIA in the last 168 hours. Coagulation Profile: No results for input(s): INR, PROTIME in the last 168 hours. Cardiac Enzymes: No results for input(s): CKTOTAL, CKMB, CKMBINDEX, TROPONINI in the last 168 hours. BNP (last 3 results) No results for input(s): PROBNP in the last 8760 hours. HbA1C: No results for input(s): HGBA1C in the last 72 hours. CBG: No results for input(s): GLUCAP in the last 168 hours. Lipid Profile: No results for input(s): CHOL, HDL, LDLCALC, TRIG, CHOLHDL, LDLDIRECT in the last 72 hours. Thyroid Function Tests: No results for input(s): TSH, T4TOTAL, FREET4, T3FREE, THYROIDAB in the last 72  hours. Anemia Panel: No results for input(s): VITAMINB12, FOLATE, FERRITIN, TIBC, IRON, RETICCTPCT in the last 72 hours. Urine analysis:    Component Value Date/Time   COLORURINE AMBER (A) 12/18/2017 0155   APPEARANCEUR CLEAR 12/18/2017 0155   APPEARANCEUR Hazy 11/13/2011 1910   LABSPEC 1.040 (H) 12/18/2017 0155   LABSPEC 1.015 11/03/2013 1218   PHURINE 5.0 12/18/2017 0155   GLUCOSEU NEGATIVE 12/18/2017 0155   GLUCOSEU NEGATIVE 11/03/2013 1218   HGBUR NEGATIVE 12/18/2017 0155   BILIRUBINUR NEGATIVE 12/18/2017 0155   BILIRUBINUR Negative 11/13/2011 1910   KETONESUR 80 (A) 12/18/2017 0155   PROTEINUR 100 (A) 12/18/2017 0155   UROBILINOGEN 2.0 (H) 08/23/2014 2100   NITRITE NEGATIVE 12/18/2017 0155   LEUKOCYTESUR NEGATIVE 12/18/2017 0155   LEUKOCYTESUR Negative 11/13/2011 1910  Radiological Exams on Admission: Ct Abdomen Pelvis Wo Contrast  Result Date: 12/18/2017 CLINICAL DATA:  Nausea and vomiting. This began 2 days ago. History of bowel obstruction. EXAM: CT ABDOMEN AND PELVIS WITHOUT CONTRAST TECHNIQUE: Multidetector CT imaging of the abdomen and pelvis was performed following the standard protocol without IV contrast. COMPARISON:  CT 08/23/2014.  Small bowel series 11/23/2011. FINDINGS: Lower chest: There is asymmetric opacity RIGHT middle lobe which appears acute, query aspiration pneumonia. Hepatobiliary: No focal liver abnormality is seen. No gallstones, gallbladder wall thickening, or biliary dilatation. Pancreas: Unremarkable. No pancreatic ductal dilatation or surrounding inflammatory changes. Spleen: Mild splenomegaly. Adrenals/Urinary Tract: No hydronephrosis or visible renal mass. 2 mm non-obstructing calculus LEFT mid renal collecting system. Bladder unremarkable. Stomach/Bowel: Partial, small bowel obstruction, with moderately distended loops proximally. Transition zone difficult to identify due to the lack of oral contrast. No colonic distention, or colonic inflammatory  change. There is no appendiceal inflammation. 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 Fourth duodenum and ligament of Treitz appears to be RIGHT of midline. Vascular/Lymphatic: No significant vascular findings are present. No enlarged abdominal or pelvic lymph nodes. Reproductive: Prostate is unremarkable. Other: No ascites.  Umbilical hernia containing only fat. Musculoskeletal: Lumbar spondylosis. IMPRESSION: Partial small bowel obstruction, transition zone difficult to identify due to lack of oral contrast. No abdominopelvic ascites or free air. Partial malrotation, see previous small bowel series. Nonobstructing LEFT renal calculus. Mild splenomegaly. Query RIGHT middle lobe pneumonia. Electronically Signed   By: Staci Righter M.D.   On: 12/18/2017 08:20    Assessment/Plan Active Problems:   Partial small bowel obstruction (HCC) - Bowel rest and MIVF's - supportive therapy with pain medication IV and antiemetics - recommended ambulation pt verbalizes agreement and understanding    Essential hypertension - will hold home medication regimen - place prn hydralazine IV rx    Low testosterone in male - stable  Polycythemia - stable   DVT prophylaxis: heparin Code Status: full Family Communication: no family at bedside Disposition Plan: med surg Consults called: none Admission status: obs   Velvet Bathe MD [facility] Pager [phone]  If 7PM-7AM, please contact night-coverage [email or web address] Password [facility]  12/18/2017, 10:26 AM

## 2017-12-18 NOTE — ED Triage Notes (Signed)
Patient complaining of nausea, vomiting, diarrhea that started Thursday around 10 am. Patient has a hx of bowel obstruction. Patient states that he is hurting all over.

## 2017-12-18 NOTE — Plan of Care (Signed)
  Problem: Pain Managment: Goal: General experience of comfort will improve Outcome: Progressing   

## 2017-12-19 ENCOUNTER — Observation Stay (HOSPITAL_COMMUNITY): Payer: BLUE CROSS/BLUE SHIELD

## 2017-12-19 DIAGNOSIS — Z881 Allergy status to other antibiotic agents status: Secondary | ICD-10-CM | POA: Diagnosis not present

## 2017-12-19 DIAGNOSIS — Z886 Allergy status to analgesic agent status: Secondary | ICD-10-CM | POA: Diagnosis not present

## 2017-12-19 DIAGNOSIS — G4730 Sleep apnea, unspecified: Secondary | ICD-10-CM | POA: Diagnosis present

## 2017-12-19 DIAGNOSIS — Z79890 Hormone replacement therapy (postmenopausal): Secondary | ICD-10-CM | POA: Diagnosis not present

## 2017-12-19 DIAGNOSIS — Z9989 Dependence on other enabling machines and devices: Secondary | ICD-10-CM | POA: Diagnosis not present

## 2017-12-19 DIAGNOSIS — Z7982 Long term (current) use of aspirin: Secondary | ICD-10-CM | POA: Diagnosis not present

## 2017-12-19 DIAGNOSIS — D751 Secondary polycythemia: Secondary | ICD-10-CM | POA: Diagnosis present

## 2017-12-19 DIAGNOSIS — K56600 Partial intestinal obstruction, unspecified as to cause: Secondary | ICD-10-CM | POA: Diagnosis present

## 2017-12-19 DIAGNOSIS — N400 Enlarged prostate without lower urinary tract symptoms: Secondary | ICD-10-CM | POA: Diagnosis present

## 2017-12-19 DIAGNOSIS — Z8719 Personal history of other diseases of the digestive system: Secondary | ICD-10-CM | POA: Diagnosis not present

## 2017-12-19 DIAGNOSIS — Z79899 Other long term (current) drug therapy: Secondary | ICD-10-CM | POA: Diagnosis not present

## 2017-12-19 DIAGNOSIS — E291 Testicular hypofunction: Secondary | ICD-10-CM | POA: Diagnosis present

## 2017-12-19 DIAGNOSIS — I10 Essential (primary) hypertension: Secondary | ICD-10-CM | POA: Diagnosis present

## 2017-12-19 DIAGNOSIS — Z885 Allergy status to narcotic agent status: Secondary | ICD-10-CM | POA: Diagnosis not present

## 2017-12-19 DIAGNOSIS — Z91041 Radiographic dye allergy status: Secondary | ICD-10-CM | POA: Diagnosis not present

## 2017-12-19 LAB — CBC WITH DIFFERENTIAL/PLATELET
Basophils Absolute: 0 10*3/uL (ref 0.0–0.1)
Basophils Relative: 0 %
Eosinophils Absolute: 0.1 10*3/uL (ref 0.0–0.7)
Eosinophils Relative: 1 %
HEMATOCRIT: 48.6 % (ref 39.0–52.0)
HEMOGLOBIN: 17.1 g/dL — AB (ref 13.0–17.0)
LYMPHS ABS: 1.3 10*3/uL (ref 0.7–4.0)
Lymphocytes Relative: 24 %
MCH: 32.3 pg (ref 26.0–34.0)
MCHC: 35.2 g/dL (ref 30.0–36.0)
MCV: 91.7 fL (ref 78.0–100.0)
Monocytes Absolute: 0.4 10*3/uL (ref 0.1–1.0)
Monocytes Relative: 7 %
NEUTROS PCT: 68 %
Neutro Abs: 3.7 10*3/uL (ref 1.7–7.7)
Platelets: 155 10*3/uL (ref 150–400)
RBC: 5.3 MIL/uL (ref 4.22–5.81)
RDW: 14.1 % (ref 11.5–15.5)
WBC: 5.4 10*3/uL (ref 4.0–10.5)

## 2017-12-19 LAB — COMPREHENSIVE METABOLIC PANEL
ALK PHOS: 51 U/L (ref 38–126)
ALT: 25 U/L (ref 17–63)
AST: 18 U/L (ref 15–41)
Albumin: 3.8 g/dL (ref 3.5–5.0)
Anion gap: 10 (ref 5–15)
BILIRUBIN TOTAL: 1 mg/dL (ref 0.3–1.2)
BUN: 12 mg/dL (ref 6–20)
CALCIUM: 8.3 mg/dL — AB (ref 8.9–10.3)
CO2: 28 mmol/L (ref 22–32)
CREATININE: 1.25 mg/dL — AB (ref 0.61–1.24)
Chloride: 100 mmol/L — ABNORMAL LOW (ref 101–111)
GFR calc non Af Amer: >60 mL/min (ref >60)
Glucose, Bld: 122 mg/dL — ABNORMAL HIGH (ref 65–99)
Potassium: 3.8 mmol/L (ref 3.5–5.1)
Sodium: 138 mmol/L (ref 135–145)
TOTAL PROTEIN: 6.4 g/dL — AB (ref 6.5–8.1)

## 2017-12-19 LAB — HIV ANTIBODY (ROUTINE TESTING W REFLEX): HIV SCREEN 4TH GENERATION: NONREACTIVE

## 2017-12-19 LAB — MAGNESIUM: Magnesium: 2 mg/dL (ref 1.7–2.4)

## 2017-12-19 MED ORDER — FAMOTIDINE IN NACL 20-0.9 MG/50ML-% IV SOLN
20.0000 mg | Freq: Once | INTRAVENOUS | Status: AC
  Administered 2017-12-19: 20 mg via INTRAVENOUS
  Filled 2017-12-19: qty 50

## 2017-12-19 NOTE — Progress Notes (Signed)
Patient ID: [name], male   DOB: 1960-12-04, 57 y.o.   MRN: [id number]  PROGRESS NOTE    [name]  GXQ:[id number] DOB: Jun 26, 1961 DOA: 12/18/2017 PCP: Dion Body, MD   Brief Narrative:  57 year old male with history of hypertension, polycythemia, low testosterone, hiatal hernia and prior history of small bowel obstruction presented on 12/18/2017 with nausea and vomiting.  CT scan showed partial small bowel obstruction.  Assessment & Plan:   Active Problems:   Partial small bowel obstruction (HCC)   Essential hypertension   Low testosterone in male  Partial small bowel obstruction -Patient is started to pass gas.  No bowel movement yet.  X-ray abdomen this morning shows persistence of partial small bowel obstruction. -Continue n.p.o. along with IV fluids and pain medications.  If symptoms do not improve, might need general surgery consultation -Currently not nauseous with no abdominal pain. -Continue ambulation  Essential hypertension -Use as needed hydralazine if needed  Polycythemia -Stable.  Outpatient follow-up    DVT prophylaxis: Heparin Code Status: Full Family Communication: None at bedside Disposition Plan: Probable discharge in 1 to 2 days once bowel function resumes  Consultants: None Procedures: None  Antimicrobials: None   Subjective: Patient seen and examined at bedside.  No overnight fever, nausea or vomiting.  His abdominal pain has improved.  He is starting to pass gas.  No bowel movements yet. Objective: Vitals:   12/18/17 1058 12/18/17 1214 12/18/17 1947 12/19/17 0608  BP: 114/65 119/81 131/88 97/61  Pulse: 95 93 (!) 102 82  Resp: 18 17 16 16   Temp:  99 F (37.2 C) 98.7 F (37.1 C) 98.1 F (36.7 C)  TempSrc:  Oral Oral Oral  SpO2: 96% 94% 97% 98%  Weight:      Height:        Intake/Output Summary (Last 24 hours) at 12/19/2017 1204 Last data filed at 12/18/2017 2300 Gross per 24 hour  Intake 1053.33 ml  Output -  Net  1053.33 ml   Filed Weights   12/18/17 0141  Weight: 99.8 kg (220 lb)    Examination:  General exam: Appears calm and comfortable  Respiratory system: Bilateral decreased breath sound at bases Cardiovascular system: S1 & S2 heard, rate controlled  gastrointestinal system: Abdomen is nondistended, soft and nontender. bowel sounds sluggish Extremities: No cyanosis, clubbing, edema    Data Reviewed: I have personally reviewed following labs and imaging studies  CBC: Recent Labs  Lab 12/18/17 0150 12/19/17 0828  WBC 7.3 5.4  NEUTROABS  --  3.7  HGB 18.0* 17.1*  HCT 50.9 48.6  MCV 90.9 91.7  PLT 147* 144   Basic Metabolic Panel: Recent Labs  Lab 12/18/17 0150 12/19/17 0828  NA 140 138  K 4.2 3.8  CL 102 100*  CO2 27 28  GLUCOSE 134* 122*  BUN 20 12  CREATININE 1.06 1.25*  CALCIUM 8.7* 8.3*  MG  --  2.0   GFR: Estimated Creatinine Clearance: 79.8 mL/min (A) (by C-G formula based on SCr of 1.25 mg/dL (H)). Liver Function Tests: Recent Labs  Lab 12/18/17 0150 12/19/17 0828  AST 24 18  ALT 33 25  ALKPHOS 56 51  BILITOT 1.4* 1.0  PROT 6.6 6.4*  ALBUMIN 4.0 3.8   Recent Labs  Lab 12/18/17 0150  LIPASE 24   No results for input(s): AMMONIA in the last 168 hours. Coagulation Profile: No results for input(s): INR, PROTIME in the last 168 hours. Cardiac Enzymes: No results for input(s): CKTOTAL, CKMB,  CKMBINDEX, TROPONINI in the last 168 hours. BNP (last 3 results) No results for input(s): PROBNP in the last 8760 hours. HbA1C: No results for input(s): HGBA1C in the last 72 hours. CBG: No results for input(s): GLUCAP in the last 168 hours. Lipid Profile: No results for input(s): CHOL, HDL, LDLCALC, TRIG, CHOLHDL, LDLDIRECT in the last 72 hours. Thyroid Function Tests: No results for input(s): TSH, T4TOTAL, FREET4, T3FREE, THYROIDAB in the last 72 hours. Anemia Panel: No results for input(s): VITAMINB12, FOLATE, FERRITIN, TIBC, IRON, RETICCTPCT in the  last 72 hours. Sepsis Labs: No results for input(s): PROCALCITON, LATICACIDVEN in the last 168 hours.  No results found for this or any previous visit (from the past 240 hour(s)).       Radiology Studies: Ct Abdomen Pelvis Wo Contrast  Result Date: 12/18/2017 CLINICAL DATA:  Nausea and vomiting. This began 2 days ago. History of bowel obstruction. EXAM: CT ABDOMEN AND PELVIS WITHOUT CONTRAST TECHNIQUE: Multidetector CT imaging of the abdomen and pelvis was performed following the standard protocol without IV contrast. COMPARISON:  CT 08/23/2014.  Small bowel series 11/23/2011. FINDINGS: Lower chest: There is asymmetric opacity RIGHT middle lobe which appears acute, query aspiration pneumonia. Hepatobiliary: No focal liver abnormality is seen. No gallstones, gallbladder wall thickening, or biliary dilatation. Pancreas: Unremarkable. No pancreatic ductal dilatation or surrounding inflammatory changes. Spleen: Mild splenomegaly. Adrenals/Urinary Tract: No hydronephrosis or visible renal mass. 2 mm non-obstructing calculus LEFT mid renal collecting system. Bladder unremarkable. Stomach/Bowel: Partial, small bowel obstruction, with moderately distended loops proximally. Transition zone difficult to identify due to the lack of oral contrast. No colonic distention, or colonic inflammatory change. There is no appendiceal inflammation. 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 Fourth duodenum and ligament of Treitz appears to be RIGHT of midline. Vascular/Lymphatic: No significant vascular findings are present. No enlarged abdominal or pelvic lymph nodes. Reproductive: Prostate is unremarkable. Other: No ascites.  Umbilical hernia containing only fat. Musculoskeletal: Lumbar spondylosis. IMPRESSION: Partial small bowel obstruction, transition zone difficult to identify due to lack of oral contrast. No abdominopelvic ascites or free air. Partial malrotation, see previous  small bowel series. Nonobstructing LEFT renal calculus. Mild splenomegaly. Query RIGHT middle lobe pneumonia. Electronically Signed   By: Staci Righter M.D.   On: 12/18/2017 08:20   Dg Abd Portable 1v  Result Date: 12/19/2017 CLINICAL DATA:  Followup small bowel obstruction. EXAM: PORTABLE ABDOMEN - 1 VIEW COMPARISON:  CT on 12/18/2017 FINDINGS: Mildly dilated small bowel loops are again seen within the left abdomen, similar to prior CT. Distal small bowel and colon are nondilated. These findings remains suspicious for a partial small bowel obstruction. IMPRESSION: New findings remains suspicious for partial small bowel obstruction, without significant change compared to recent CT. Electronically Signed   By: Earle Gell M.D.   On: 12/19/2017 09:04        Scheduled Meds: . heparin  5,000 Units Subcutaneous Q8H  . mouth rinse  15 mL Mouth Rinse BID   Continuous Infusions: . dextrose 5 % and 0.45 % NaCl with KCl 20 mEq/L 100 mL/hr at 12/19/17 0808     LOS: 0 days        Aline August, MD [facility] Pager [phone]  If 7PM-7AM, please contact night-coverage [email or web address] Password [facility] 12/19/2017, 12:04 PM

## 2017-12-20 ENCOUNTER — Inpatient Hospital Stay (HOSPITAL_COMMUNITY): Payer: BLUE CROSS/BLUE SHIELD

## 2017-12-20 LAB — CBC WITH DIFFERENTIAL/PLATELET
BASOS ABS: 0 10*3/uL (ref 0.0–0.1)
BASOS PCT: 0 %
EOS PCT: 2 %
Eosinophils Absolute: 0.1 10*3/uL (ref 0.0–0.7)
HCT: 45.6 % (ref 39.0–52.0)
Hemoglobin: 15.6 g/dL (ref 13.0–17.0)
Lymphocytes Relative: 28 %
Lymphs Abs: 1.5 10*3/uL (ref 0.7–4.0)
MCH: 31.3 pg (ref 26.0–34.0)
MCHC: 34.2 g/dL (ref 30.0–36.0)
MCV: 91.4 fL (ref 78.0–100.0)
MONO ABS: 0.5 10*3/uL (ref 0.1–1.0)
MONOS PCT: 10 %
Neutro Abs: 3.1 10*3/uL (ref 1.7–7.7)
Neutrophils Relative %: 60 %
PLATELETS: 166 10*3/uL (ref 150–400)
RBC: 4.99 MIL/uL (ref 4.22–5.81)
RDW: 13.9 % (ref 11.5–15.5)
WBC: 5.2 10*3/uL (ref 4.0–10.5)

## 2017-12-20 LAB — MAGNESIUM: Magnesium: 1.9 mg/dL (ref 1.7–2.4)

## 2017-12-20 LAB — BASIC METABOLIC PANEL
ANION GAP: 7 (ref 5–15)
BUN: 11 mg/dL (ref 6–20)
CALCIUM: 8.3 mg/dL — AB (ref 8.9–10.3)
CO2: 28 mmol/L (ref 22–32)
CREATININE: 0.92 mg/dL (ref 0.61–1.24)
Chloride: 104 mmol/L (ref 101–111)
GFR calc Af Amer: >60 mL/min (ref >60)
Glucose, Bld: 106 mg/dL — ABNORMAL HIGH (ref 65–99)
Potassium: 3.9 mmol/L (ref 3.5–5.1)
Sodium: 139 mmol/L (ref 135–145)

## 2017-12-20 MED ORDER — POLYETHYLENE GLYCOL 3350 17 G PO PACK: 17.0000 g | PACK | Freq: Every day | ORAL | 0 refills | Status: DC | PRN

## 2017-12-20 MED ORDER — ONDANSETRON HCL 4 MG PO TABS: 4.0000 mg | ORAL_TABLET | Freq: Four times a day (QID) | ORAL | 0 refills | Status: DC | PRN

## 2017-12-20 MED ORDER — TRAMADOL HCL 50 MG PO TABS: 50.0000 mg | ORAL_TABLET | Freq: Four times a day (QID) | ORAL | 0 refills | Status: DC | PRN

## 2017-12-20 NOTE — Discharge Summary (Signed)
Physician Discharge Summary  [name] WLS:[id number] DOB: [birth year] DOA: 12/18/2017  PCP: Dion Body, MD  Admit date: 12/18/2017 Discharge date: 12/20/2017  Admitted From: Home  disposition: Home  Recommendations for Outpatient Follow-up:  1. Follow up with PCP in 1 week   Home Health: No Equipment/Devices: None  Discharge Condition: Stable CODE STATUS: Full Diet recommendation: Heart Healthy /soft diet  Brief/Interim Summary: 57 year old male with history of hypertension, polycythemia, low testosterone, hiatal hernia and prior history of small bowel obstruction presented on 12/18/2017 with nausea and vomiting.  CT scan showed partial small bowel obstruction.  Patient was kept n.p.o. and given IV fluids.  He started passing gas.  He was started on clear liquid diet.  He had bowel movements.  He wants to go home.  He will be discharged with outpatient follow-up with primary care provider.    Discharge Diagnoses:  Active Problems:   Partial small bowel obstruction (HCC)   Essential hypertension   Low testosterone in male   Partial small bowel obstruction -  Patient was kept n.p.o. and given IV fluids.  He started passing gas.  He was started on clear liquid diet.  He had bowel movements.  He wants to go home.  He will be discharged with outpatient follow-up with primary care provider. -Currently no nausea, no abdominal pain -Continue ambulation -X-ray today shows resolving small bowel obstruction  Essential hypertension -Patient follow-up.  Continue home regimen  Polycythemia -Stable.  Outpatient follow-up     Discharge Instructions  Discharge Instructions    Call MD for:  difficulty breathing, headache or visual disturbances   Complete by:  As directed    Call MD for:  extreme fatigue   Complete by:  As directed    Call MD for:  hives   Complete by:  As directed    Call MD for:  persistant dizziness or light-headedness   Complete by:  As directed     Call MD for:  persistant nausea and vomiting   Complete by:  As directed    Call MD for:  severe uncontrolled pain   Complete by:  As directed    Call MD for:  temperature >100.4   Complete by:  As directed    Diet - low sodium heart healthy   Complete by:  As directed    Discharge instructions   Complete by:  As directed    Soft diet   Increase activity slowly   Complete by:  As directed      Allergies as of 12/20/2017      Reactions   Codeine Itching   Contrast Media [iodinated Diagnostic Agents] Itching   Erythromycin    Stomach pain   Hydrocodone-acetaminophen Itching      Medication List    TAKE these medications   aspirin EC 81 MG tablet Take 162 mg by mouth daily.   cetirizine 10 MG tablet Commonly known as:  ZYRTEC Take 10 mg by mouth daily.   ibuprofen 200 MG tablet Commonly known as:  ADVIL,MOTRIN Take 400 mg by mouth daily as needed for pain.   lisinopril 20 MG tablet Commonly known as:  PRINIVIL,ZESTRIL Take 20 mg by mouth daily.   multivitamin tablet Take 1 tablet by mouth daily.   ondansetron 4 MG tablet Commonly known as:  ZOFRAN Take 1 tablet (4 mg total) by mouth every 6 (six) hours as needed for nausea.   polyethylene glycol packet Commonly known as:  MIRALAX Take 17 g by mouth  daily as needed for moderate constipation.   tamsulosin 0.4 MG Caps capsule Commonly known as:  FLOMAX Take 0.4 mg by mouth daily.   testosterone cypionate 200 MG/ML injection Commonly known as:  DEPOTESTOSTERONE CYPIONATE Inject 0.6 mLs into the muscle once a week. Saturdays   traMADol 50 MG tablet Commonly known as:  ULTRAM Take 1 tablet (50 mg total) by mouth every 6 (six) hours as needed.      Follow-up Information    Dion Body, MD. Schedule an appointment as soon as possible for a visit in 1 week(s).   Specialty:  Family Medicine Contact information: [address] [place] [address] [phone]          Allergies   Allergen Reactions  . Codeine Itching  . Contrast Media [Iodinated Diagnostic Agents] Itching  . Erythromycin     Stomach pain  . Hydrocodone-Acetaminophen Itching    Consultations:  None   Procedures/Studies: Ct Abdomen Pelvis Wo Contrast  Result Date: 12/18/2017 CLINICAL DATA:  Nausea and vomiting. This began 2 days ago. History of bowel obstruction. EXAM: CT ABDOMEN AND PELVIS WITHOUT CONTRAST TECHNIQUE: Multidetector CT imaging of the abdomen and pelvis was performed following the standard protocol without IV contrast. COMPARISON:  CT 08/23/2014.  Small bowel series 11/23/2011. FINDINGS: Lower chest: There is asymmetric opacity RIGHT middle lobe which appears acute, query aspiration pneumonia. Hepatobiliary: No focal liver abnormality is seen. No gallstones, gallbladder wall thickening, or biliary dilatation. Pancreas: Unremarkable. No pancreatic ductal dilatation or surrounding inflammatory changes. Spleen: Mild splenomegaly. Adrenals/Urinary Tract: No hydronephrosis or visible renal mass. 2 mm non-obstructing calculus LEFT mid renal collecting system. Bladder unremarkable. Stomach/Bowel: Partial, small bowel obstruction, with moderately distended loops proximally. Transition zone difficult to identify due to the lack of oral contrast. No colonic distention, or colonic inflammatory change. There is no appendiceal inflammation. 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 Fourth duodenum and ligament of Treitz appears to be RIGHT of midline. Vascular/Lymphatic: No significant vascular findings are present. No enlarged abdominal or pelvic lymph nodes. Reproductive: Prostate is unremarkable. Other: No ascites.  Umbilical hernia containing only fat. Musculoskeletal: Lumbar spondylosis. IMPRESSION: Partial small bowel obstruction, transition zone difficult to identify due to lack of oral contrast. No abdominopelvic ascites or free air. Partial malrotation, see  previous small bowel series. Nonobstructing LEFT renal calculus. Mild splenomegaly. Query RIGHT middle lobe pneumonia. Electronically Signed   By: Staci Righter M.D.   On: 12/18/2017 08:20   Dg Abd 2 Views  Result Date: 12/20/2017 CLINICAL DATA:  Follow-up SBO EXAM: ABDOMEN - 2 VIEW COMPARISON:  12/19/2017 FINDINGS: There is gas in the small bowel and colon. There is mild small bowel distension which has improved from the prior examination. There is no evidence of free air. No radio-opaque calculi or other significant radiographic abnormality is seen. IMPRESSION: 1. Resolving small-bowel obstruction Electronically Signed   By: Kathreen Devoid   On: 12/20/2017 09:09   Dg Abd Portable 1v  Result Date: 12/19/2017 CLINICAL DATA:  Followup small bowel obstruction. EXAM: PORTABLE ABDOMEN - 1 VIEW COMPARISON:  CT on 12/18/2017 FINDINGS: Mildly dilated small bowel loops are again seen within the left abdomen, similar to prior CT. Distal small bowel and colon are nondilated. These findings remains suspicious for a partial small bowel obstruction. IMPRESSION: New findings remains suspicious for partial small bowel obstruction, without significant change compared to recent CT. Electronically Signed   By: Earle Gell M.D.   On: 12/19/2017  09:04      Subjective: Patient seen and examined at bedside.  He feels better and wants to go home.  He is passing gas, tolerating diet and had bowel movement.  Discharge Exam: Vitals:   12/20/17 0518 12/20/17 1358  BP: 110/72 130/86  Pulse: 76 71  Resp: 13 18  Temp: (!) 97.3 F (36.3 C)   SpO2: 98% 100%   Vitals:   12/19/17 1303 12/19/17 2026 12/20/17 0518 12/20/17 1358  BP: 129/90 (!) 125/92 110/72 130/86  Pulse: 87 79 76 71  Resp: 18 16 13 18   Temp: 98.7 F (37.1 C) 98.2 F (36.8 C) (!) 97.3 F (36.3 C)   TempSrc: Oral Oral Axillary   SpO2: 100% 96% 98% 100%  Weight:      Height:        General: Pt is alert, awake, not in acute distress Cardiovascular:  Rate controlled, S1/S2 + Respiratory: Bilateral decreased breath sounds at bases Abdominal: Soft, NT, ND, bowel sounds + Extremities: no edema, no cyanosis    The results of significant diagnostics from this hospitalization (including imaging, microbiology, ancillary and laboratory) are listed below for reference.     Microbiology: No results found for this or any previous visit (from the past 240 hour(s)).   Labs: BNP (last 3 results) No results for input(s): BNP in the last 8760 hours. Basic Metabolic Panel: Recent Labs  Lab 12/18/17 0150 12/19/17 0828 12/20/17 0433  NA 140 138 139  K 4.2 3.8 3.9  CL 102 100* 104  CO2 27 28 28   GLUCOSE 134* 122* 106*  BUN 20 12 11   CREATININE 1.06 1.25* 0.92  CALCIUM 8.7* 8.3* 8.3*  MG  --  2.0 1.9   Liver Function Tests: Recent Labs  Lab 12/18/17 0150 12/19/17 0828  AST 24 18  ALT 33 25  ALKPHOS 56 51  BILITOT 1.4* 1.0  PROT 6.6 6.4*  ALBUMIN 4.0 3.8   Recent Labs  Lab 12/18/17 0150  LIPASE 24   No results for input(s): AMMONIA in the last 168 hours. CBC: Recent Labs  Lab 12/18/17 0150 12/19/17 0828 12/20/17 0433  WBC 7.3 5.4 5.2  NEUTROABS  --  3.7 3.1  HGB 18.0* 17.1* 15.6  HCT 50.9 48.6 45.6  MCV 90.9 91.7 91.4  PLT 147* 155 166   Cardiac Enzymes: No results for input(s): CKTOTAL, CKMB, CKMBINDEX, TROPONINI in the last 168 hours. BNP: Invalid input(s): POCBNP CBG: No results for input(s): GLUCAP in the last 168 hours. D-Dimer No results for input(s): DDIMER in the last 72 hours. Hgb A1c No results for input(s): HGBA1C in the last 72 hours. Lipid Profile No results for input(s): CHOL, HDL, LDLCALC, TRIG, CHOLHDL, LDLDIRECT in the last 72 hours. Thyroid function studies No results for input(s): TSH, T4TOTAL, T3FREE, THYROIDAB in the last 72 hours.  Invalid input(s): FREET3 Anemia work up No results for input(s): VITAMINB12, FOLATE, FERRITIN, TIBC, IRON, RETICCTPCT in the last 72 hours. Urinalysis     Component Value Date/Time   COLORURINE AMBER (A) 12/18/2017 0155   APPEARANCEUR CLEAR 12/18/2017 0155   APPEARANCEUR Hazy 11/13/2011 1910   LABSPEC 1.040 (H) 12/18/2017 0155   LABSPEC 1.015 11/03/2013 1218   PHURINE 5.0 12/18/2017 0155   GLUCOSEU NEGATIVE 12/18/2017 0155   GLUCOSEU NEGATIVE 11/03/2013 1218   HGBUR NEGATIVE 12/18/2017 0155   BILIRUBINUR NEGATIVE 12/18/2017 0155   BILIRUBINUR Negative 11/13/2011 1910   KETONESUR 80 (A) 12/18/2017 0155   PROTEINUR 100 (A) 12/18/2017 0155   UROBILINOGEN  2.0 (H) 08/23/2014 2100   NITRITE NEGATIVE 12/18/2017 0155   LEUKOCYTESUR NEGATIVE 12/18/2017 0155   LEUKOCYTESUR Negative 11/13/2011 1910   Sepsis Labs Invalid input(s): PROCALCITONIN,  WBC,  LACTICIDVEN Microbiology No results found for this or any previous visit (from the past 240 hour(s)).   Time coordinating discharge: 35 minutes  SIGNED:   Aline August, MD  [facility] 12/20/2017, 3:29 PM Pager: [phone]  If 7PM-7AM, please contact night-coverage [email or web address] Password [email or web address]

## 2017-12-20 NOTE — Progress Notes (Signed)
Patient ID: [name], male   DOB: [birth year], 57 y.o.   MRN: [id number]  PROGRESS NOTE    DAIVD FREDERICKSEN  KGM:[id number] DOB: 1961/04/24 DOA: 12/18/2017 PCP: Dion Body, MD   Brief Narrative:  57 year old male with history of hypertension, polycythemia, low testosterone, hiatal hernia and prior history of small bowel obstruction presented on 12/18/2017 with nausea and vomiting.  CT scan showed partial small bowel obstruction.  Assessment & Plan:   Active Problems:   Partial small bowel obstruction (HCC)   Essential hypertension   Low testosterone in male  Partial small bowel obstruction -Patient is passing gas.  Had a very tiny liquidy bowel movement last night.  Will follow x-ray of the abdomen today. -Start clear liquid diet.  Advance diet if he tolerates and if he starts having bowel movement -Currently not nauseous with no abdominal pain -Continue ambulation  Essential hypertension -Use as needed hydralazine if needed  Polycythemia -Stable.  Outpatient follow-up    DVT prophylaxis: Heparin Code Status: Full Family Communication: None at bedside Disposition Plan: Probable discharge in 1 to 2 days once bowel function resumes  Consultants: None Procedures: None  Antimicrobials: None   Subjective: Patient seen and examined at bedside.  No worsening abdominal pain, nausea or vomiting.  He is still passing gas and had a tiny liquidy bowel movement last night. Objective: Vitals:   12/19/17 0608 12/19/17 1303 12/19/17 2026 12/20/17 0518  BP: 97/61 129/90 (!) 125/92 110/72  Pulse: 82 87 79 76  Resp: 16 18 16 13   Temp: 98.1 F (36.7 C) 98.7 F (37.1 C) 98.2 F (36.8 C) (!) 97.3 F (36.3 C)  TempSrc: Oral Oral Oral Axillary  SpO2: 98% 100% 96% 98%  Weight:      Height:        Intake/Output Summary (Last 24 hours) at 12/20/2017 0957 Last data filed at 12/20/2017 0600 Gross per 24 hour  Intake 2300 ml  Output 1 ml  Net 2299 ml   Filed Weights   12/18/17 0141  Weight: 99.8 kg (220 lb)    Examination:  General exam: Appears calm and comfortable.  No distress Respiratory system: Lateral decreased breath sounds at bases  cardiovascular system: S1 & S2 heard, rate controlled  gastrointestinal system: Abdomen is nondistended, soft and nontender. bowel sounds sluggish Extremities: No cyanosis, clubbing, edema    Data Reviewed: I have personally reviewed following labs and imaging studies  CBC: Recent Labs  Lab 12/18/17 0150 12/19/17 0828 12/20/17 0433  WBC 7.3 5.4 5.2  NEUTROABS  --  3.7 3.1  HGB 18.0* 17.1* 15.6  HCT 50.9 48.6 45.6  MCV 90.9 91.7 91.4  PLT 147* 155 644   Basic Metabolic Panel: Recent Labs  Lab 12/18/17 0150 12/19/17 0828 12/20/17 0433  NA 140 138 139  K 4.2 3.8 3.9  CL 102 100* 104  CO2 27 28 28   GLUCOSE 134* 122* 106*  BUN 20 12 11   CREATININE 1.06 1.25* 0.92  CALCIUM 8.7* 8.3* 8.3*  MG  --  2.0 1.9   GFR: Estimated Creatinine Clearance: 108.4 mL/min (by C-G formula based on SCr of 0.92 mg/dL). Liver Function Tests: Recent Labs  Lab 12/18/17 0150 12/19/17 0828  AST 24 18  ALT 33 25  ALKPHOS 56 51  BILITOT 1.4* 1.0  PROT 6.6 6.4*  ALBUMIN 4.0 3.8   Recent Labs  Lab 12/18/17 0150  LIPASE 24   No results for input(s): AMMONIA in the last 168 hours. Coagulation Profile: No  results for input(s): INR, PROTIME in the last 168 hours. Cardiac Enzymes: No results for input(s): CKTOTAL, CKMB, CKMBINDEX, TROPONINI in the last 168 hours. BNP (last 3 results) No results for input(s): PROBNP in the last 8760 hours. HbA1C: No results for input(s): HGBA1C in the last 72 hours. CBG: No results for input(s): GLUCAP in the last 168 hours. Lipid Profile: No results for input(s): CHOL, HDL, LDLCALC, TRIG, CHOLHDL, LDLDIRECT in the last 72 hours. Thyroid Function Tests: No results for input(s): TSH, T4TOTAL, FREET4, T3FREE, THYROIDAB in the last 72 hours. Anemia Panel: No results for  input(s): VITAMINB12, FOLATE, FERRITIN, TIBC, IRON, RETICCTPCT in the last 72 hours. Sepsis Labs: No results for input(s): PROCALCITON, LATICACIDVEN in the last 168 hours.  No results found for this or any previous visit (from the past 240 hour(s)).       Radiology Studies: Dg Abd 2 Views  Result Date: 12/20/2017 CLINICAL DATA:  Follow-up SBO EXAM: ABDOMEN - 2 VIEW COMPARISON:  12/19/2017 FINDINGS: There is gas in the small bowel and colon. There is mild small bowel distension which has improved from the prior examination. There is no evidence of free air. No radio-opaque calculi or other significant radiographic abnormality is seen. IMPRESSION: 1. Resolving small-bowel obstruction Electronically Signed   By: Kathreen Devoid   On: 12/20/2017 09:09   Dg Abd Portable 1v  Result Date: 12/19/2017 CLINICAL DATA:  Followup small bowel obstruction. EXAM: PORTABLE ABDOMEN - 1 VIEW COMPARISON:  CT on 12/18/2017 FINDINGS: Mildly dilated small bowel loops are again seen within the left abdomen, similar to prior CT. Distal small bowel and colon are nondilated. These findings remains suspicious for a partial small bowel obstruction. IMPRESSION: New findings remains suspicious for partial small bowel obstruction, without significant change compared to recent CT. Electronically Signed   By: Earle Gell M.D.   On: 12/19/2017 09:04        Scheduled Meds: . heparin  5,000 Units Subcutaneous Q8H  . mouth rinse  15 mL Mouth Rinse BID   Continuous Infusions: . dextrose 5 % and 0.45 % NaCl with KCl 20 mEq/L 100 mL/hr at 12/20/17 0600     LOS: 1 day        Aline August, MD [facility] Pager [phone]  If 7PM-7AM, please contact night-coverage [email or web address] Password [facility] 12/20/2017, 9:57 AM

## 2017-12-20 NOTE — Progress Notes (Signed)
Patient discharged home. He left the floor in stable condition with staff and wife. Discharge instructions given to both and they verbalized understanding.

## 2017-12-20 NOTE — Plan of Care (Signed)
Adequate for discharge.

## 2017-12-30 ENCOUNTER — Encounter: Payer: Self-pay | Admitting: Pulmonary Disease

## 2017-12-30 ENCOUNTER — Ambulatory Visit (INDEPENDENT_AMBULATORY_CARE_PROVIDER_SITE_OTHER): Payer: BLUE CROSS/BLUE SHIELD | Admitting: Pulmonary Disease

## 2017-12-30 VITALS — BP 118/78 | HR 86 | Ht 75.0 in | Wt 224.0 lb

## 2017-12-30 DIAGNOSIS — R918 Other nonspecific abnormal finding of lung field: Secondary | ICD-10-CM | POA: Diagnosis not present

## 2017-12-30 NOTE — Patient Instructions (Signed)
For now, nothing further needs to be done with regard to the x-ray findings.  Return in 4 weeks with a chest x-ray just prior to that visit and we will review it side-by-side with the current chest x-ray and decide whether further diagnostic evaluation is necessary

## 2018-01-06 NOTE — Progress Notes (Signed)
PULMONARY CONSULT NOTE  Requesting MD/Service: Self. PCP [facility] Date of initial consultation: 12/30/17 Reason for consultation: Pulmonary infiltrate  PT PROFILE: 57 y.o. male never smoker with recent hospitalization for partial SBO treated conservatively. CTAP revealed pulmonary infiltrates  DATA: 12/18/17 CTAP: Partial SBO. Asymmetric opacity R middle lobe which appears acute, query aspiration pneumonia. (likely atelectasis)  INTERVAL:  HPI:  Recent hospitalization 4/27-4/29 with partial SBO which resolved spontaneously. His CTAP is noted above. He has no respiratory symptoms. Denies CP, SOB, cough, sputum, hemoptysis, fever, LE edema, calf tenderness.  Underwent CXR 05/02 @ [place] which is not available to me view but reportedly showed RLL subsegmental atelectasis  Has OSA well controlled on CPAP since 2010. Also suffers from allergies and chronic PNDS. Followed by Dr Tami Ribas  Past Medical History:  Diagnosis Date  . BPH (benign prostatic hyperplasia)   . GERD (gastroesophageal reflux disease)    no meds  . Hypertension   . Low testosterone   . Polycythemia   . Sleep apnea    uses a cpap nightly    Past Surgical History:  Procedure Laterality Date  . COLONOSCOPY    . I&D EXTREMITY Left 06/20/2013   Procedure: MINOR IRRIGATION AND DEBRIDEMENT LEFT INDEX FINGER;  Surgeon: Tennis Must, MD;  Location: [facility];  Service: Orthopedics;  Laterality: Left;  . UPPER GASTROINTESTINAL ENDOSCOPY    . WISDOM TOOTH EXTRACTION      MEDICATIONS: I have reviewed all medications and confirmed regimen as documented  Social History   Socioeconomic History  . Marital status: Married    Spouse name: Not on file  . Number of children: Not on file  . Years of education: Not on file  . Highest education level: Not on file  Occupational History  . Not on file  Social Needs  . Financial resource strain: Not on file  . Food insecurity:    Worry: Not on file   Inability: Not on file  . Transportation needs:    Medical: Not on file    Non-medical: Not on file  Tobacco Use  . Smoking status: Never Smoker  . Smokeless tobacco: Never Used  Substance and Sexual Activity  . Alcohol use: Yes    Comment: occ  . Drug use: No  . Sexual activity: Not on file  Lifestyle  . Physical activity:    Days per week: Not on file    Minutes per session: Not on file  . Stress: Not on file  Relationships  . Social connections:    Talks on phone: Not on file    Gets together: Not on file    Attends religious service: Not on file    Active member of club or organization: Not on file    Attends meetings of clubs or organizations: Not on file    Relationship status: Not on file  . Intimate partner violence:    Fear of current or ex partner: Not on file    Emotionally abused: Not on file    Physically abused: Not on file    Forced sexual activity: Not on file  Other Topics Concern  . Not on file  Social History Narrative  . Not on file    History reviewed. No pertinent family history.  ROS: No fever, myalgias/arthralgias, unexplained weight loss or weight gain No new focal weakness or sensory deficits No otalgia, hearing loss, visual changes, nasal and sinus symptoms, mouth and throat problems No neck pain or adenopathy No abdominal  pain, N/V/D, diarrhea, change in bowel pattern No dysuria, change in urinary pattern   Vitals:   12/30/17 1510 12/30/17 1520  BP:  118/78  Pulse:  86  SpO2:  98%  Weight: 224 lb (101.6 kg)   Height: 6\' 3"  (1.905 m)      EXAM:  Gen: WDWN in NAD HEENT: NCAT, sclerae white, oropharynx normal Neck: No LAN, no JVD noted Lungs: full BS, normal percussion note throughout, no adventitious sounds Cardiovascular: Regular, normal rate, no M noted Abdomen: Soft, NT, +BS Ext: no C/C/E Neuro: PERRL, EOMI, motor/sensory grossly intact Skin: No lesions noted   DATA:   BMP Latest Ref Rng & Units 12/20/2017 12/19/2017  12/18/2017  Glucose 65 - 99 mg/dL 106(H) 122(H) 134(H)  BUN 6 - 20 mg/dL 11 12 20   Creatinine 0.61 - 1.24 mg/dL 0.92 1.25(H) 1.06  Sodium 135 - 145 mmol/L 139 138 140  Potassium 3.5 - 5.1 mmol/L 3.9 3.8 4.2  Chloride 101 - 111 mmol/L 104 100(L) 102  CO2 22 - 32 mmol/L 28 28 27   Calcium 8.9 - 10.3 mg/dL 8.3(L) 8.3(L) 8.7(L)    CBC Latest Ref Rng & Units 12/20/2017 12/19/2017 12/18/2017  WBC 4.0 - 10.5 K/uL 5.2 5.4 7.3  Hemoglobin 13.0 - 17.0 g/dL 15.6 17.1(H) 18.0(H)  Hematocrit 39.0 - 52.0 % 45.6 48.6 50.9  Platelets 150 - 400 K/uL 166 155 147(L)    CXR:  N/A  I have personally reviewed all chest radiographs reported above including CXRs and CT chest unless otherwise indicated  IMPRESSION:     ICD-10-CM   1. Pulmonary infiltrate R91.8 DG Chest 2 View   The CT findings likely represent atelectasis due to SBO and abd distention. Absence of respiratory symptoms is reassuring.  PLAN:  For now, nothing further needs to be done with regard to the x-ray findings.  Return in 4 weeks with a chest x-ray just prior to that visit and we will review it side-by-side with the current chest x-ray and decide whether further diagnostic evaluation is necessary   Merton Border, MD PCCM service Mobile [phone] Pager [phone] 01/06/2018 11:37 PM

## 2018-01-24 ENCOUNTER — Ambulatory Visit
Admission: RE | Admit: 2018-01-24 | Discharge: 2018-01-24 | Disposition: A | Discharge status: Home / Self Care | Payer: BLUE CROSS/BLUE SHIELD | Source: Ambulatory Visit | Attending: Pulmonary Disease | Admitting: Pulmonary Disease

## 2018-01-24 ENCOUNTER — Encounter: Payer: Self-pay | Admitting: Pulmonary Disease

## 2018-01-24 ENCOUNTER — Ambulatory Visit (INDEPENDENT_AMBULATORY_CARE_PROVIDER_SITE_OTHER): Payer: BLUE CROSS/BLUE SHIELD | Admitting: Pulmonary Disease

## 2018-01-24 VITALS — BP 132/82 | HR 91 | Ht 75.0 in | Wt 230.0 lb

## 2018-01-24 DIAGNOSIS — R918 Other nonspecific abnormal finding of lung field: Secondary | ICD-10-CM | POA: Diagnosis present

## 2018-01-24 NOTE — Patient Instructions (Signed)
No further pulmonary evaluation or treatment is warranted.  Please call us if you have any respiratory, lung or breathing problems.  Follow-up as needed

## 2018-01-24 NOTE — Progress Notes (Signed)
PULMONARY OFFICE FOLLOW UP NOTE  Requesting MD/Service: Self. PCP [facility] Date of initial consultation: 12/30/17 Reason for consultation: Pulmonary infiltrate  PT PROFILE: 57 y.o. male never smoker with recent hospitalization for partial SBO treated conservatively. CTAP revealed pulmonary infiltrates  DATA: 12/18/17 CTAP: Partial SBO. Asymmetric opacity R middle lobe which appears acute, query aspiration pneumonia. (likely atelectasis)  INTERVAL: No major events  SUBJ:  No abdominal or respiratory complaints. Feels well. Denies CP, fever, purulent sputum, hemoptysis, LE edema and calf tenderness.   Vitals:   01/24/18 0826 01/24/18 0828  BP:  132/82  Pulse:  91  SpO2:  98%  Weight: 230 lb (104.3 kg)   Height: 6\' 3"  (1.905 m)      EXAM:  Gen: NAD HEENT: NCAT, sclera white Neck: No JVD Lungs: breath sounds full, no wheezes or other adventitious sounds Cardiovascular: RRR, no murmurs Abdomen: Soft, nontender, normal BS Ext: without clubbing, cyanosis, edema Neuro: grossly intact Skin: Limited exam, no lesions noted    DATA:   BMP Latest Ref Rng & Units 12/20/2017 12/19/2017 12/18/2017  Glucose 65 - 99 mg/dL 106(H) 122(H) 134(H)  BUN 6 - 20 mg/dL 11 12 20   Creatinine 0.61 - 1.24 mg/dL 0.92 1.25(H) 1.06  Sodium 135 - 145 mmol/L 139 138 140  Potassium 3.5 - 5.1 mmol/L 3.9 3.8 4.2  Chloride 101 - 111 mmol/L 104 100(L) 102  CO2 22 - 32 mmol/L 28 28 27   Calcium 8.9 - 10.3 mg/dL 8.3(L) 8.3(L) 8.7(L)    CBC Latest Ref Rng & Units 12/20/2017 12/19/2017 12/18/2017  WBC 4.0 - 10.5 K/uL 5.2 5.4 7.3  Hemoglobin 13.0 - 17.0 g/dL 15.6 17.1(H) 18.0(H)  Hematocrit 39.0 - 52.0 % 45.6 48.6 50.9  Platelets 150 - 400 K/uL 166 155 147(L)    CXR:  NACPD  I have personally reviewed all chest radiographs reported above including CXRs and CT chest unless otherwise indicated  IMPRESSION:     ICD-10-CM   1. Pulmonary infiltrates, resolved R91.8     PLAN:  No further pulmonary  evaluation or treatment is indicated Follow up as needed   Merton Border, MD PCCM service Mobile [phone] Pager [phone] 01/24/2018 8:49 AM

## 2018-09-22 DIAGNOSIS — I10 Essential (primary) hypertension: Secondary | ICD-10-CM | POA: Diagnosis not present

## 2018-09-22 DIAGNOSIS — R079 Chest pain, unspecified: Secondary | ICD-10-CM | POA: Diagnosis not present

## 2018-09-23 DIAGNOSIS — R079 Chest pain, unspecified: Secondary | ICD-10-CM

## 2019-03-20 ENCOUNTER — Ambulatory Visit
Admission: EM | Admit: 2019-03-20 | Discharge: 2019-03-20 | Disposition: A | Discharge status: Home / Self Care | Payer: BC Managed Care – PPO | Attending: Family Medicine | Admitting: Family Medicine

## 2019-03-20 ENCOUNTER — Other Ambulatory Visit: Payer: Self-pay

## 2019-03-20 DIAGNOSIS — R350 Frequency of micturition: Secondary | ICD-10-CM | POA: Diagnosis not present

## 2019-03-20 DIAGNOSIS — R399 Unspecified symptoms and signs involving the genitourinary system: Secondary | ICD-10-CM

## 2019-03-20 LAB — URINALYSIS, COMPLETE (UACMP) WITH MICROSCOPIC
Bacteria, UA: NONE SEEN
WBC, UA: NONE SEEN WBC/hpf (ref 0–5)

## 2019-03-20 MED ORDER — TAMSULOSIN HCL 0.4 MG PO CAPS: 0.8000 mg | ORAL_CAPSULE | Freq: Every day | ORAL | 3 refills | Status: DC

## 2019-03-20 NOTE — Discharge Instructions (Signed)
No evidence of UTI.  I have increased your flomax.  Contact your urologist.  Take care  Dr. Lacinda Axon

## 2019-03-20 NOTE — ED Provider Notes (Signed)
[facility]    CSN: [id number] Arrival date & time: 03/20/19  1003  History   Chief Complaint Chief Complaint  Patient presents with  . Urinary Frequency   HPI  58 year old male presents with urinary symptoms.  Patient reports a 2 to 3-week history of urinary frequency and urgency.  Denies dysuria.  Denies abdominal pain.  No fever.  Patient has had some ongoing low back pain which is being treated.  He is followed by urology and endorses compliance with his home medications.  Has known BPH and is currently on Flomax.  Has an upcoming appointment with his urologist in October.  No other reported symptoms.  No other complaints.  PMH, Surgical Hx, Family Hx, Social History reviewed and updated as below.  Past Medical History:  Diagnosis Date  . BPH (benign prostatic hyperplasia)   . GERD (gastroesophageal reflux disease)    no meds  . Hypertension   . Low testosterone   . Polycythemia   . Sleep apnea    uses a cpap nightly   Patient Active Problem List   Diagnosis Date Noted  . Partial small bowel obstruction ([place]) 12/18/2017  . Essential hypertension 12/18/2017  . Low testosterone in male 12/18/2017   Past Surgical History:  Procedure Laterality Date  . COLONOSCOPY    . I&D EXTREMITY Left 06/20/2013   Procedure: MINOR IRRIGATION AND DEBRIDEMENT LEFT INDEX FINGER;  Surgeon: Tennis Must, MD;  Location: [place];  Service: Orthopedics;  Laterality: Left;  . UPPER GASTROINTESTINAL ENDOSCOPY    . WISDOM TOOTH EXTRACTION      Home Medications    Prior to Admission medications   Medication Sig Start Date End Date Taking? Authorizing Provider  aspirin EC 81 MG tablet Take 162 mg by mouth daily.    Yes [provider]  cetirizine (ZYRTEC) 10 MG tablet Take 10 mg by mouth daily.   Yes [provider]  lisinopril (PRINIVIL,ZESTRIL) 20 MG tablet Take 20 mg by mouth daily.   Yes [provider]  Multiple Vitamin  (MULTIVITAMIN) tablet Take 1 tablet by mouth daily.   Yes [provider]  polyethylene glycol (MIRALAX) packet Take 17 g by mouth daily as needed for moderate constipation. 12/20/17  Yes Aline August, MD  Probiotic Product (PROBIOTIC ADVANCED PO) Take 1 tablet by mouth daily.   Yes [provider]  testosterone cypionate (DEPOTESTOSTERONE CYPIONATE) 200 MG/ML injection Inject 0.6 mLs into the muscle once a week. Saturdays 09/12/17  Yes [provider]  tamsulosin (FLOMAX) 0.4 MG CAPS capsule Take 2 capsules (0.8 mg total) by mouth daily. 03/20/19   Coral Spikes, DO    Family History Family History  Problem Relation Age of Onset  . Colon cancer Father     Social History Social History   Tobacco Use  . Smoking status: Never Smoker  . Smokeless tobacco: Never Used  Substance Use Topics  . Alcohol use: Yes    Comment: occ  . Drug use: No     Allergies   Codeine, Contrast media [iodinated diagnostic agents], Erythromycin, and Hydrocodone-acetaminophen   Review of Systems Review of Systems  Constitutional: Negative.   Gastrointestinal: Negative.   Genitourinary: Positive for frequency and urgency.   Physical Exam Triage Vital Signs ED Triage Vitals  Enc Vitals Group     BP 03/20/19 1025 109/82     Pulse Rate 03/20/19 1025 84     Resp 03/20/19 1025 17     Temp  03/20/19 1025 98.1 F (36.7 C)     Temp Source 03/20/19 1025 Oral     SpO2 03/20/19 1025 98 %     Weight 03/20/19 1022 248 lb (112.5 kg)     Height 03/20/19 1022 6\' 3"  (1.905 m)     Head Circumference --      Peak Flow --      Pain Score 03/20/19 1022 3     Pain Loc --      Pain Edu? --      Excl. in [place]? --    Updated Vital Signs BP 109/82 (BP Location: Right Arm)   Pulse 84   Temp 98.1 F (36.7 C) (Oral)   Resp 17   Ht 6\' 3"  (1.905 m)   Wt 112.5 kg   SpO2 98%   BMI 31.00 kg/m   Visual Acuity Right Eye Distance:   Left Eye Distance:   Bilateral Distance:    Right Eye  Near:   Left Eye Near:    Bilateral Near:     Physical Exam Constitutional:      General: He is not in acute distress.    Appearance: Normal appearance.  HENT:     Head: Normocephalic and atraumatic.  Eyes:     General:        Right eye: No discharge.        Left eye: No discharge.     Conjunctiva/sclera: Conjunctivae normal.  Cardiovascular:     Rate and Rhythm: Normal rate and regular rhythm.  Abdominal:     General: There is no distension.     Palpations: Abdomen is soft.     Tenderness: There is no abdominal tenderness.  Neurological:     Mental Status: He is alert.  Psychiatric:        Mood and Affect: Mood normal.        Behavior: Behavior normal.    UC Treatments / Results  Labs (all labs ordered are listed, but only abnormal results are displayed) Labs Reviewed  URINALYSIS, COMPLETE (UACMP) WITH MICROSCOPIC - Abnormal; Notable for the following components:      Result Value   Color, Urine ORANGE (*)    Glucose, UA   (*)    Value: TEST NOT REPORTED DUE TO COLOR INTERFERENCE OF URINE PIGMENT   Hgb urine dipstick   (*)    Value: TEST NOT REPORTED DUE TO COLOR INTERFERENCE OF URINE PIGMENT   Bilirubin Urine   (*)    Value: TEST NOT REPORTED DUE TO COLOR INTERFERENCE OF URINE PIGMENT   Ketones, ur   (*)    Value: TEST NOT REPORTED DUE TO COLOR INTERFERENCE OF URINE PIGMENT   Protein, ur   (*)    Value: TEST NOT REPORTED DUE TO COLOR INTERFERENCE OF URINE PIGMENT   Nitrite   (*)    Value: TEST NOT REPORTED DUE TO COLOR INTERFERENCE OF URINE PIGMENT   Leukocytes,Ua   (*)    Value: TEST NOT REPORTED DUE TO COLOR INTERFERENCE OF URINE PIGMENT   All other components within normal limits    EKG   Radiology No results found.  Procedures Procedures (including critical care time)  Medications Ordered in UC Medications - No data to display  Initial Impression / Assessment and Plan / UC Course  I have reviewed the triage vital signs and the nursing notes.   Pertinent labs & imaging results that were available during my care of the patient were reviewed by me and  considered in my medical decision making (see chart for details).    58 year old male presents with urinary frequency and urgency.  Urinalysis not consistent with UTI.  I suspect that the patient is experiencing symptoms secondary to BPH.  Increasing Flomax.  Advised to follow-up with urology.  Final Clinical Impressions(s) / UC Diagnoses   Final diagnoses:  Lower urinary tract symptoms (LUTS)     Discharge Instructions     No evidence of UTI.  I have increased your flomax.  Contact your urologist.  Take care  Dr. Lacinda Axon    ED Prescriptions    Medication Sig Dispense Auth. Provider   tamsulosin (FLOMAX) 0.4 MG CAPS capsule Take 2 capsules (0.8 mg total) by mouth daily. 60 capsule Coral Spikes, DO     Controlled Substance Prescriptions [place] Controlled Substance Registry consulted? Not Applicable   Coral Spikes, DO 03/20/19 1128

## 2019-03-20 NOTE — ED Triage Notes (Signed)
Patient complains of urinary urgency and frequency that started around 2 weeks ago and has been worsening.

## 2019-06-22 IMAGING — CT CT ABD-PELV W/O CM
2 of 4 series · 16 of 46 positions shown, 18 images · non-contrast
Comparison: CT 08/23/2014.  Small bowel series 11/23/2011.

CLINICAL DATA: Nausea and vomiting. This began 2 days ago. History
of bowel obstruction.

EXAM:
CT ABDOMEN AND PELVIS WITHOUT CONTRAST
TECHNIQUE: Multidetector CT imaging of the abdomen and pelvis was performed
following the standard protocol without IV contrast.

[Series 2: axial st · axial · 0.83mm/px · z∈[-492,-28]mm · 13 of 103 slices shown, 15 images]
[im 5/103  soft-tissue]
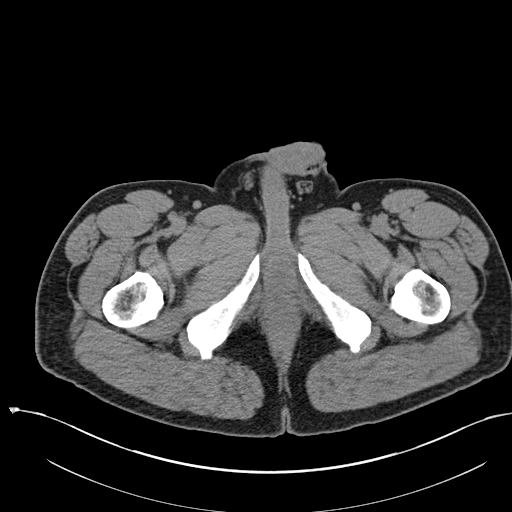
[im 5/103  bone]
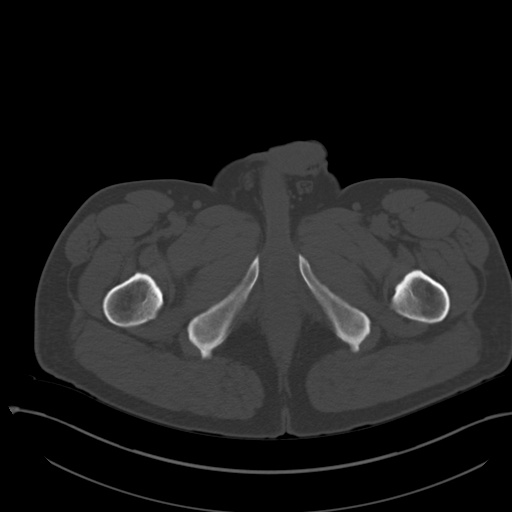
[im 15/103  soft-tissue]
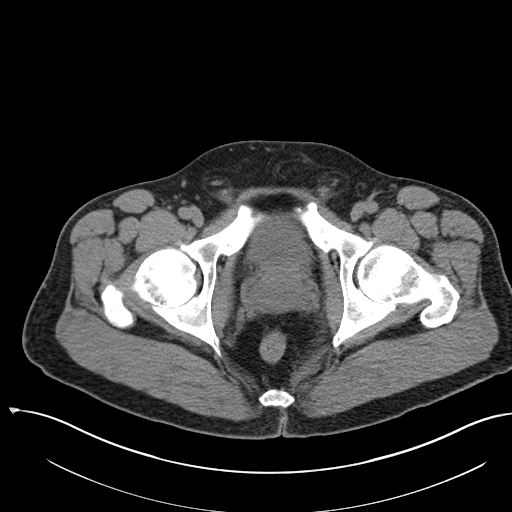
[im 20/103  soft-tissue]
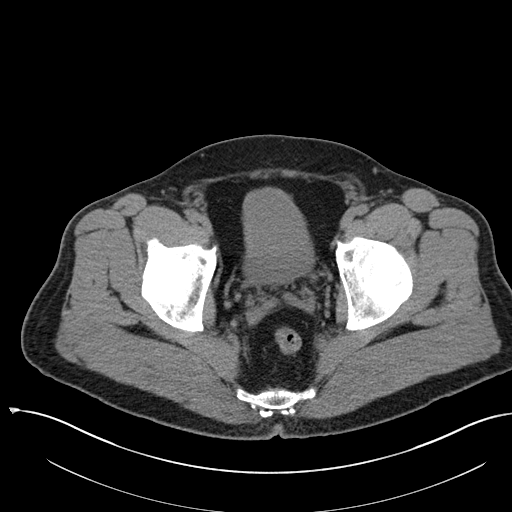
[im 30/103  soft-tissue]
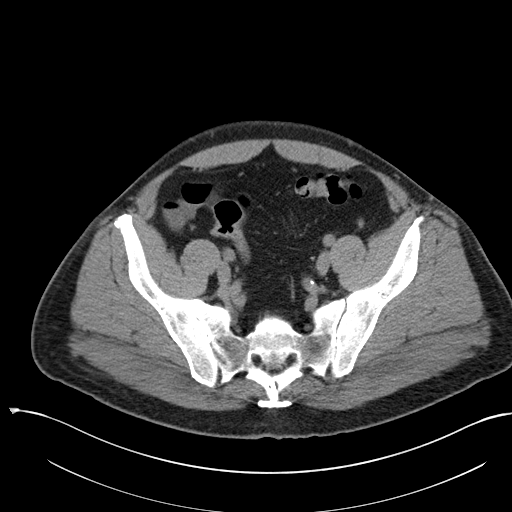
[im 35/103  soft-tissue]
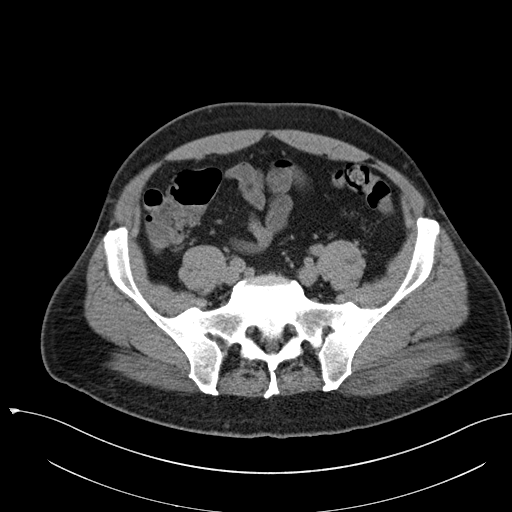
[im 44/103  soft-tissue]
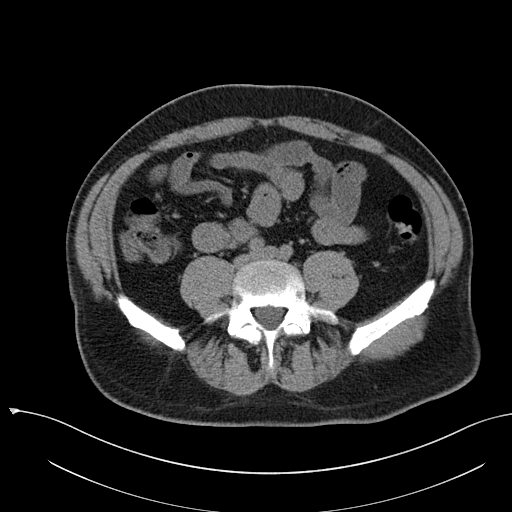
[im 54/103  soft-tissue]
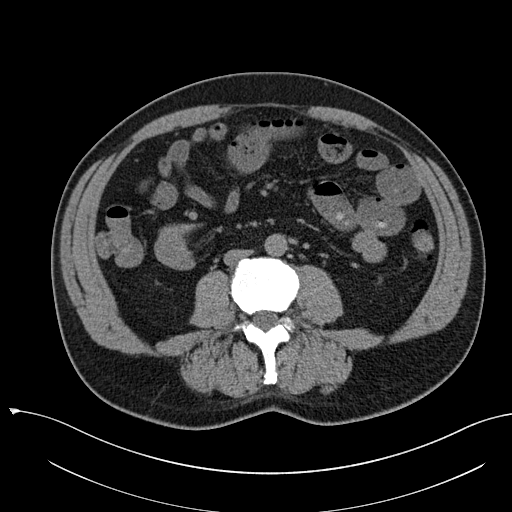
[im 59/103  soft-tissue]
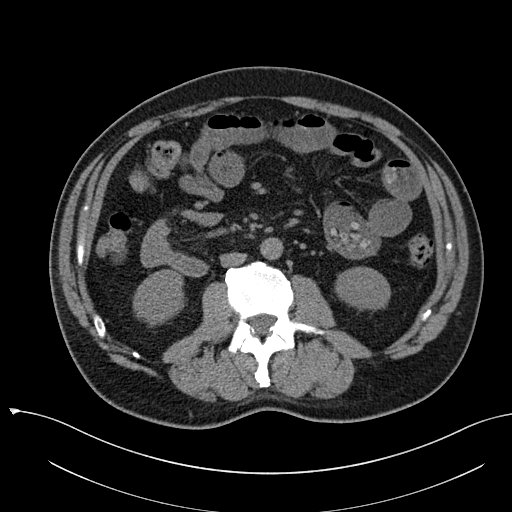
[im 69/103  soft-tissue]
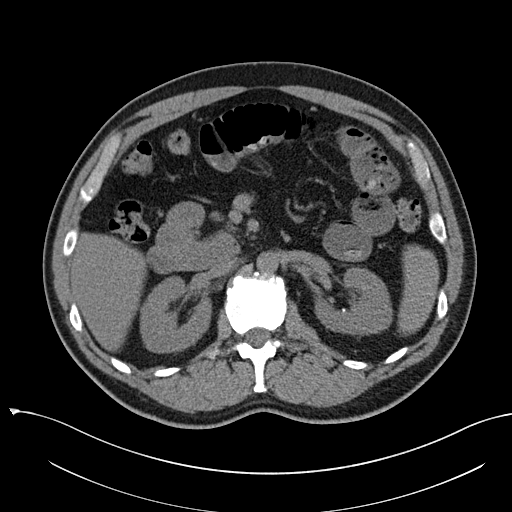
[im 69/103  bone]
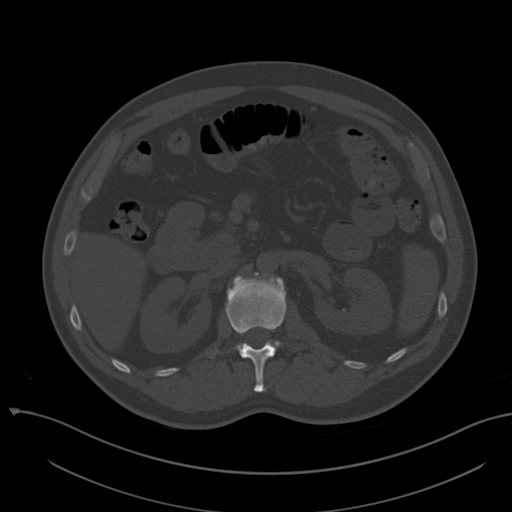
[im 73/103  soft-tissue]
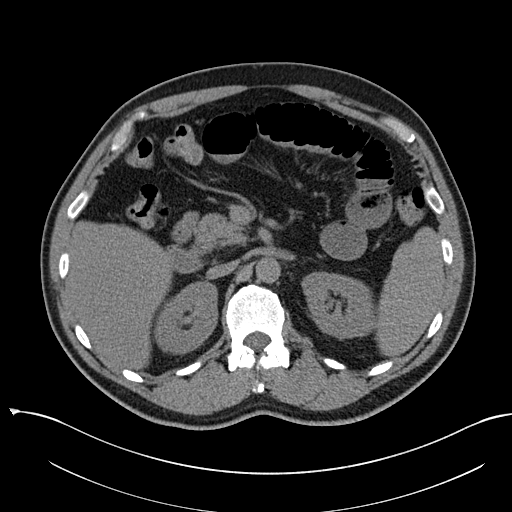
[im 83/103  soft-tissue]
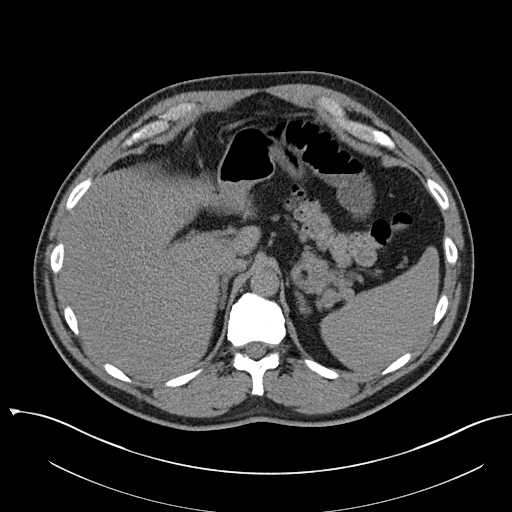
[im 88/103  soft-tissue]
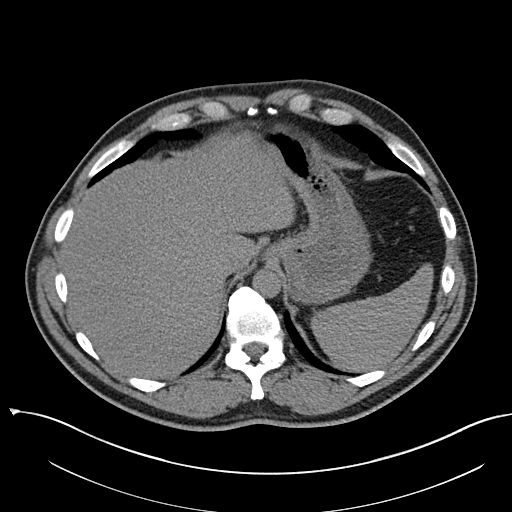
[im 98/103  soft-tissue]
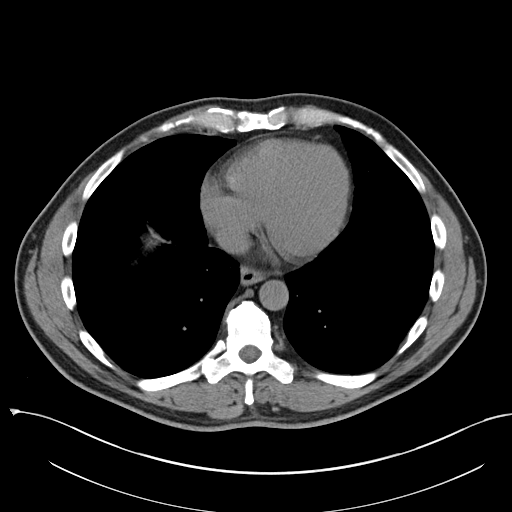

[Series 5: coronal st · coronal · 0.75mm/px · 3 of 102 slices shown]
[im 34/102  soft-tissue]
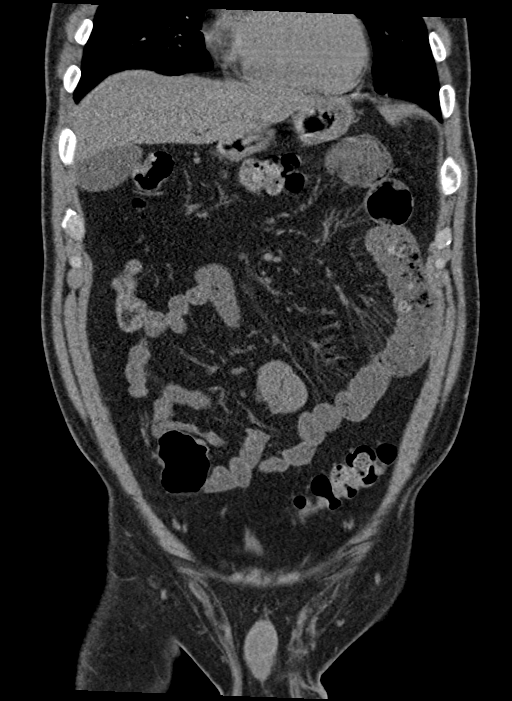
[im 45/102  soft-tissue]
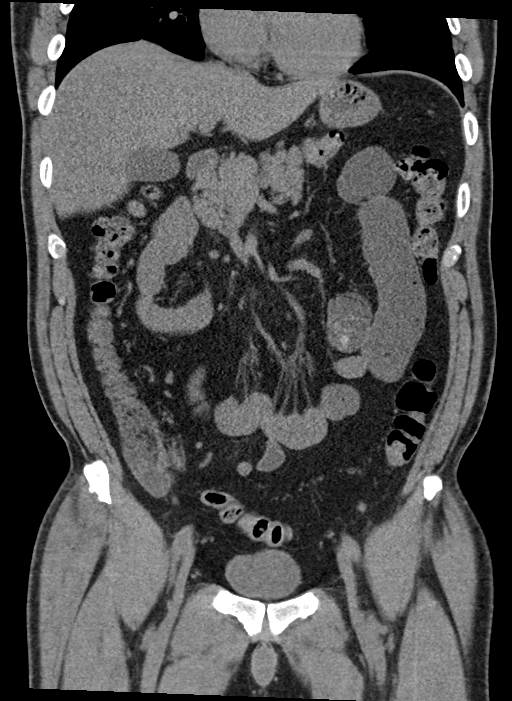
[im 57/102  soft-tissue]
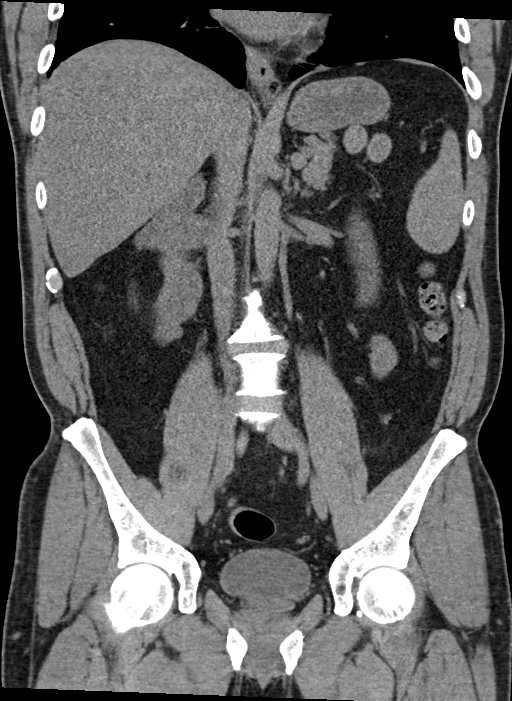

[16 of 46 positions shown; findings below may reference images not displayed]

FINDINGS: Lower chest: There is asymmetric opacity RIGHT middle lobe which
appears acute, query aspiration pneumonia.

Hepatobiliary: No focal liver abnormality is seen. No gallstones,
gallbladder wall thickening, or biliary dilatation.

Pancreas: Unremarkable. No pancreatic ductal dilatation or
surrounding inflammatory changes.

Spleen: Mild splenomegaly.

Adrenals/Urinary Tract: No hydronephrosis or visible renal mass. 2
mm non-obstructing calculus LEFT mid renal collecting system.
Bladder unremarkable.

Stomach/Bowel: Partial, small bowel obstruction, with moderately
distended loops proximally. Transition zone difficult to identify
due to the lack of oral contrast. No colonic distention, or colonic
inflammatory change. There is no appendiceal inflammation. Partial
malrotation was described on the previous CT, and prior small bowel
series. Fourth duodenum and ligament of Treitz appears to be RIGHT
of midline.

Vascular/Lymphatic: No significant vascular findings are present. No
enlarged abdominal or pelvic lymph nodes.

Reproductive: Prostate is unremarkable.

Other: No ascites.  Umbilical hernia containing only fat.

Musculoskeletal: Lumbar spondylosis.
IMPRESSION: Partial small bowel obstruction, transition zone difficult to
identify due to lack of oral contrast. No abdominopelvic ascites or
free air.

Partial malrotation, see previous small bowel series.

Nonobstructing LEFT renal calculus.

Mild splenomegaly.

Query RIGHT middle lobe pneumonia.

## 2019-08-25 HISTORY — PX: HERNIA REPAIR: SHX51

## 2020-02-20 ENCOUNTER — Other Ambulatory Visit: Payer: Self-pay

## 2020-02-20 DIAGNOSIS — N401 Enlarged prostate with lower urinary tract symptoms: Secondary | ICD-10-CM

## 2020-02-20 DIAGNOSIS — E291 Testicular hypofunction: Secondary | ICD-10-CM

## 2020-02-27 ENCOUNTER — Telehealth: Payer: Self-pay | Admitting: Urology

## 2020-02-27 NOTE — Telephone Encounter (Signed)
Reports increase in testicular swelling and unable to walk. Appt scheduled for this Thursday to see Dr. Alyson Ingles

## 2020-02-27 NOTE — Telephone Encounter (Signed)
Pt requests a nurse call him back. He is former [facility] pt of Dr Ruel Favors and is having an issue.

## 2020-02-29 ENCOUNTER — Other Ambulatory Visit: Payer: Self-pay

## 2020-02-29 ENCOUNTER — Ambulatory Visit (INDEPENDENT_AMBULATORY_CARE_PROVIDER_SITE_OTHER): Payer: BC Managed Care – PPO | Admitting: Urology

## 2020-02-29 DIAGNOSIS — R1030 Lower abdominal pain, unspecified: Secondary | ICD-10-CM | POA: Diagnosis not present

## 2020-02-29 MED ORDER — TESTOSTERONE CYPIONATE 200 MG/ML IM SOLN: 120.0000 mg | INTRAMUSCULAR | 3 refills | Status: DC

## 2020-02-29 NOTE — Progress Notes (Signed)
02/29/2020 11:26 AM   [name] 07-10-1961 [id number]  Referring provider: Dion Body, MD [place] [facility] [place],  [place] [address]  Pelvic pain  HPI: [name] is a 59yo with a hx of hypogonadism here for worsening left inguinal and testis pain. For over 6 months he notes dull, intermittent, mild left groin and testis pain related to lifting and activity. He notes intermittent left scrotal swelling the day after heavy lifting. No other exacerbating events. No alleviaitng events. No other associated symptoms   PMH: Past Medical History:  Diagnosis Date  . BPH (benign prostatic hyperplasia)   . GERD (gastroesophageal reflux disease)    no meds  . Hypertension   . Low testosterone   . Polycythemia   . Sleep apnea    uses a cpap nightly    Surgical History: Past Surgical History:  Procedure Laterality Date  . COLONOSCOPY    . I & D EXTREMITY Left 06/20/2013   Procedure: MINOR IRRIGATION AND DEBRIDEMENT LEFT INDEX FINGER;  Surgeon: Tennis Must, MD;  Location: [place];  Service: Orthopedics;  Laterality: Left;  . UPPER GASTROINTESTINAL ENDOSCOPY    . WISDOM TOOTH EXTRACTION      Home Medications:  Allergies as of 02/29/2020      Reactions   Codeine Itching   Contrast Media [iodinated Diagnostic Agents] Itching   Erythromycin    Stomach pain   Hydrocodone-acetaminophen Itching   Other Other (See Comments)   Gets hot Other reaction(s): UNKNOWN Gets hot Gets hot Gets hot Other reaction(s): UNKNOWN      Medication List       Accurate as of February 29, 2020 11:26 AM. If you have any questions, ask your nurse or doctor.        albuterol 108 (90 Base) MCG/ACT inhaler Commonly known as: VENTOLIN HFA Inhale into the lungs.   aspirin 81 MG EC tablet Take by mouth. What changed: Another medication with the same name was removed. Continue taking this medication, and follow the directions you see here. Changed  by: Nicolette Bang, MD   azelastine 0.1 % nasal spray Commonly known as: ASTELIN Place into the nose.   cetirizine 10 MG tablet Commonly known as: ZYRTEC Take 10 mg by mouth daily.   Cholecalciferol 25 MCG (1000 UT) tablet Take by mouth.   cyanocobalamin 1000 MCG/ML injection Commonly known as: (VITAMIN B-12) Inject into the muscle.   fluticasone 50 MCG/ACT nasal spray Commonly known as: FLONASE Place into the nose.   guaiFENesin 600 MG 12 hr tablet Commonly known as: MUCINEX Take by mouth.   lisinopril 20 MG tablet Commonly known as: ZESTRIL Take 1 tablet by mouth daily. What changed: Another medication with the same name was removed. Continue taking this medication, and follow the directions you see here. Changed by: Nicolette Bang, MD   magnesium citrate Soln Take by mouth.   Mobic 15 MG tablet Generic drug: meloxicam Take by mouth.   multivitamin tablet Take 1 tablet by mouth daily.   polyethylene glycol 17 g packet Commonly known as: MiraLax Take 17 g by mouth daily as needed for moderate constipation.   Potassium Gluconate 2.5 MEQ Tabs Take 1 tablet by mouth daily.   PROBIOTIC ADVANCED PO Take 1 tablet by mouth daily.   pseudoephedrine 30 MG tablet Commonly known as: SUDAFED Take by mouth.   tadalafil 20 MG tablet Commonly known as: CIALIS Take by mouth.   tamsulosin 0.4 MG Caps capsule  Commonly known as: FLOMAX Take by mouth. What changed: Another medication with the same name was removed. Continue taking this medication, and follow the directions you see here. Changed by: Nicolette Bang, MD   testosterone cypionate 200 MG/ML injection Commonly known as: DEPOTESTOSTERONE CYPIONATE SMARTSIG:0.6 Milliliter(s) IM Once a Week What changed: Another medication with the same name was removed. Continue taking this medication, and follow the directions you see here. Changed by: Nicolette Bang, MD       Allergies:  Allergies  Allergen  Reactions  . Codeine Itching  . Contrast Media [Iodinated Diagnostic Agents] Itching  . Erythromycin     Stomach pain  . Hydrocodone-Acetaminophen Itching  . Other Other (See Comments)    Gets hot Other reaction(s): UNKNOWN Gets hot Gets hot Gets hot Other reaction(s): UNKNOWN     Family History: Family History  Problem Relation Age of Onset  . Colon cancer Father     Social History:  reports that he has never smoked. He has never used smokeless tobacco. He reports current alcohol use. He reports that he does not use drugs.  ROS: All other review of systems were reviewed and are negative except what is noted above in HPI  Physical Exam: BP 117/73   Pulse 93   Temp 97.9 F (36.6 C)   Ht 6\' 3"  (1.905 m)   Wt 225 lb (102.1 kg)   BMI 28.12 kg/m   Constitutional:  Alert and oriented, No acute distress. HEENT: [place] AT, moist mucus membranes.  Trachea midline, no masses. Cardiovascular: No clubbing, cyanosis, or edema. Respiratory: Normal respiratory effort, no increased work of breathing. GI: Abdomen is soft, nontender, nondistended, no abdominal masses GU: No CVA tenderness. Circumcised phallus. No masses/lesions on penis, testis, scrotum. atrophic testes. Left inguinal tenderness on hernia exam. Lymph: No cervical or inguinal lymphadenopathy. Skin: No rashes, bruises or suspicious lesions. Neurologic: Grossly intact, no focal deficits, moving all 4 extremities. Psychiatric: Normal mood and affect.  Laboratory Data: Lab Results  Component Value Date   WBC 5.2 12/20/2017   HGB 15.6 12/20/2017   HCT 45.6 12/20/2017   MCV 91.4 12/20/2017   PLT 166 12/20/2017    Lab Results  Component Value Date   CREATININE 0.92 12/20/2017    No results found for: PSA  No results found for: TESTOSTERONE  No results found for: HGBA1C  Urinalysis    Component Value Date/Time   COLORURINE ORANGE (A) 03/20/2019 1020   APPEARANCEUR CLEAR 03/20/2019 1020   APPEARANCEUR Hazy  11/13/2011 1910   LABSPEC  03/20/2019 1020    TEST NOT REPORTED DUE TO COLOR INTERFERENCE OF URINE PIGMENT   LABSPEC 1.015 11/03/2013 1218   PHURINE  03/20/2019 1020    TEST NOT REPORTED DUE TO COLOR INTERFERENCE OF URINE PIGMENT   GLUCOSEU (A) 03/20/2019 1020    TEST NOT REPORTED DUE TO COLOR INTERFERENCE OF URINE PIGMENT   GLUCOSEU NEGATIVE 11/03/2013 1218   HGBUR (A) 03/20/2019 1020    TEST NOT REPORTED DUE TO COLOR INTERFERENCE OF URINE PIGMENT   BILIRUBINUR (A) 03/20/2019 1020    TEST NOT REPORTED DUE TO COLOR INTERFERENCE OF URINE PIGMENT   BILIRUBINUR Negative 11/13/2011 1910   KETONESUR (A) 03/20/2019 1020    TEST NOT REPORTED DUE TO COLOR INTERFERENCE OF URINE PIGMENT   PROTEINUR (A) 03/20/2019 1020    TEST NOT REPORTED DUE TO COLOR INTERFERENCE OF URINE PIGMENT   UROBILINOGEN 2.0 (H) 08/23/2014 2100   NITRITE (A) 03/20/2019 1020    TEST  NOT REPORTED DUE TO COLOR INTERFERENCE OF URINE PIGMENT   LEUKOCYTESUR (A) 03/20/2019 1020    TEST NOT REPORTED DUE TO COLOR INTERFERENCE OF URINE PIGMENT   LEUKOCYTESUR Negative 11/13/2011 1910    Lab Results  Component Value Date   BACTERIA NONE SEEN 03/20/2019    Pertinent Imaging:  No results found for this or any previous visit.  No results found for this or any previous visit.  No results found for this or any previous visit.  No results found for this or any previous visit.  No results found for this or any previous visit.  No results found for this or any previous visit.  No results found for this or any previous visit.  No results found for this or any previous visit.   Assessment & Plan:   1. Left testis pain -CT pelvis to evaluate for hernia  No follow-ups on file.  Nicolette Bang, MD  [facility] [place]

## 2020-02-29 NOTE — Progress Notes (Signed)
Urological Symptom Review  Patient is experiencing the following symptoms: Get up at night to urinate Leakage of urine   Review of Systems  Gastrointestinal (upper)  : Negative for upper GI symptoms  Gastrointestinal (lower) : Negative for lower GI symptoms  Constitutional : Negative for symptoms  Skin: Negative for skin symptoms  Eyes: Negative for eye symptoms  Ear/Nose/Throat : Negative for Ear/Nose/Throat symptoms  Hematologic/Lymphatic: Negative for Hematologic/Lymphatic symptoms  Cardiovascular : Negative for cardiovascular symptoms  Respiratory : Negative for respiratory symptoms  Endocrine: Negative for endocrine symptoms  Musculoskeletal: Negative for musculoskeletal symptoms  Neurological: Negative for neurological symptoms  Psychologic: Negative for psychiatric symptoms

## 2020-03-04 ENCOUNTER — Other Ambulatory Visit: Payer: Self-pay

## 2020-03-04 ENCOUNTER — Telehealth: Payer: Self-pay | Admitting: Urology

## 2020-03-04 DIAGNOSIS — E291 Testicular hypofunction: Secondary | ICD-10-CM

## 2020-03-04 MED ORDER — TESTOSTERONE CYPIONATE 200 MG/ML IM SOLN: 120.0000 mg | INTRAMUSCULAR | 3 refills | Status: DC

## 2020-03-04 NOTE — Telephone Encounter (Signed)
Pt needing PA sent to [facility]. Spoke with pharmacist at [facility] that an approval looks to be done per notes from [facility]. Prior Auth Reference number [id number]. Copy of authorization submitted to be scanned in patient chart.   Left message for pt to return call

## 2020-03-04 NOTE — Telephone Encounter (Signed)
Pt called regarding prescription issue. He requested a nurse return his call.

## 2020-03-08 ENCOUNTER — Encounter: Payer: Self-pay | Admitting: Urology

## 2020-03-08 NOTE — Patient Instructions (Signed)
Hernia, Adult     A hernia happens when tissue inside your body pushes out through a weak spot in your belly muscles (abdominal wall). This makes a round lump (bulge). The lump may be:  In a scar from surgery that was done in your belly (incisional hernia).  Near your belly button (umbilical hernia).  In your groin (inguinal hernia). Your groin is the area where your leg meets your lower belly (abdomen). This kind of hernia could also be: ? In your scrotum, if you are male. ? In folds of skin around your vagina, if you are male.  In your upper thigh (femoral hernia).  Inside your belly (hiatal hernia). This happens when your stomach slides above the muscle between your belly and your chest (diaphragm). If your hernia is small and it does not cause pain, you may not need treatment. If your hernia is large or it causes pain, you may need surgery. Follow these instructions at home: Activity  Avoid stretching or overusing (straining) the muscles near your hernia. Straining can happen when you: ? Lift something heavy. ? Poop (have a bowel movement).  Do not lift anything that is heavier than 10 lb (4.5 kg), or the limit that you are told, until your doctor says that it is safe.  Use the strength of your legs when you lift something heavy. Do not use only your back muscles to lift. General instructions  Do these things if told by your doctor so you do not have trouble pooping (constipation): ? Drink enough fluid to keep your pee (urine) pale yellow. ? Eat foods that are high in fiber. These include fresh fruits and vegetables, whole grains, and beans. ? Limit foods that are high in fat and processed sugars. These include foods that are fried or sweet. ? Take medicine for trouble pooping.  When you cough, try to cough gently.  You may try to push your hernia in by very gently pressing on it when you are lying down. Do not try to force the bulge back in if it will not push in  easily.  If you are overweight, work with your doctor to lose weight safely.  Do not use any products that have nicotine or tobacco in them. These include cigarettes and e-cigarettes. If you need help quitting, ask your doctor.  If you will be having surgery (hernia repair), watch your hernia for changes in shape, size, or color. Tell your doctor if you see any changes.  Take over-the-counter and prescription medicines only as told by your doctor.  Keep all follow-up visits as told by your doctor. Contact a doctor if:  You get new pain, swelling, or redness near your hernia.  You poop fewer times in a week than normal.  You have trouble pooping.  You have poop (stool) that is more dry than normal.  You have poop that is harder or larger than normal. Get help right away if:  You have a fever.  You have belly pain that gets worse.  You feel sick to your stomach (nauseous).  You throw up (vomit).  Your hernia cannot be pushed in by very gently pressing on it when you are lying down. Do not try to force the bulge back in if it will not push in easily.  Your hernia: ? Changes in shape or size. ? Changes color. ? Feels hard or it hurts when you touch it. These symptoms may represent a serious problem that is an emergency. Do not   wait to see if the symptoms will go away. Get medical help right away. Call your local emergency services (911 in the [place] Summary  A hernia happens when tissue inside your body pushes out through a weak spot in the belly muscles. This creates a bulge.  If your hernia is small and it does not hurt, you may not need treatment. If your hernia is large or it hurts, you may need surgery.  If you will be having surgery, watch your hernia for changes in shape, size, or color. Tell your doctor about any changes. This information is not intended to replace advice given to you by your health care provider. Make sure you discuss any questions you have with  your health care provider. Document Revised: 12/01/2018 Document Reviewed: 05/12/2017 Elsevier Patient Education  2020 [employer]  

## 2020-03-14 ENCOUNTER — Telehealth: Payer: Self-pay | Admitting: Urology

## 2020-03-14 DIAGNOSIS — K4090 Unilateral inguinal hernia, without obstruction or gangrene, not specified as recurrent: Secondary | ICD-10-CM

## 2020-03-14 NOTE — Telephone Encounter (Signed)
Ok order placed.  

## 2020-03-14 NOTE — Telephone Encounter (Signed)
Pts insurance wants him to have a Ultrasound forst as we discussed. Can you put an order in?

## 2020-03-20 ENCOUNTER — Ambulatory Visit (HOSPITAL_COMMUNITY): Payer: BC Managed Care – PPO

## 2020-03-22 ENCOUNTER — Ambulatory Visit (HOSPITAL_COMMUNITY)
Admission: RE | Admit: 2020-03-22 | Discharge: 2020-03-22 | Disposition: A | Discharge status: Home / Self Care | Payer: BC Managed Care – PPO | Source: Ambulatory Visit | Attending: Urology | Admitting: Urology

## 2020-03-22 ENCOUNTER — Other Ambulatory Visit: Payer: Self-pay

## 2020-03-22 DIAGNOSIS — K4090 Unilateral inguinal hernia, without obstruction or gangrene, not specified as recurrent: Secondary | ICD-10-CM | POA: Diagnosis not present

## 2020-03-30 ENCOUNTER — Other Ambulatory Visit: Payer: Self-pay | Admitting: Family Medicine

## 2020-03-30 ENCOUNTER — Other Ambulatory Visit (HOSPITAL_COMMUNITY): Payer: Self-pay | Admitting: Family Medicine

## 2020-03-30 DIAGNOSIS — K429 Umbilical hernia without obstruction or gangrene: Secondary | ICD-10-CM

## 2020-03-30 DIAGNOSIS — K439 Ventral hernia without obstruction or gangrene: Secondary | ICD-10-CM

## 2020-04-02 NOTE — Progress Notes (Signed)
Results sent via my chart 

## 2020-04-12 ENCOUNTER — Ambulatory Visit
Admission: RE | Admit: 2020-04-12 | Discharge: 2020-04-12 | Disposition: A | Discharge status: Home / Self Care | Payer: BC Managed Care – PPO | Source: Ambulatory Visit | Attending: Family Medicine | Admitting: Family Medicine

## 2020-04-12 ENCOUNTER — Other Ambulatory Visit: Payer: Self-pay

## 2020-04-12 DIAGNOSIS — K429 Umbilical hernia without obstruction or gangrene: Secondary | ICD-10-CM | POA: Insufficient documentation

## 2020-04-12 DIAGNOSIS — K439 Ventral hernia without obstruction or gangrene: Secondary | ICD-10-CM

## 2020-04-26 ENCOUNTER — Ambulatory Visit: Payer: Self-pay | Admitting: General Surgery

## 2020-04-26 NOTE — H&P (Signed)
PATIENT PROFILE: [name] is a 59 y.o. male who presents to the Clinic for consultation at the request of [name], [place] for evaluation of umbilical hernia.  PCP:  Randy Body, MD  HISTORY OF PRESENT ILLNESS: [name] reports having umbilical hernia for years.  He reports that recently in the past few months he has been having more discomfort and pain on the periumbilical area.  The pain does not radiate to the part of the Mckinney.  The pain is aggravated by heavy lifting and doing certain abdominal wall movements at work.  There is no alleviating factors.  Patient denies any abdominal distention nausea or vomiting.  Patient denies any previous abdominal surgeries.  Patient reports having 2 episode of bowel obstruction in the past.  He was diagnosed with GI dysfunction.   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 Alyson Mckinney 12/27/2015   Low testosterone - followed by Dr. Alyson Mckinney 12/27/2015   OSA on CPAP Unknown   Essential hypertension Unknown      GENERAL REVIEW OF SYSTEMS:   General ROS: negative for - chills, fatigue, fever, weight gain or weight loss Allergy and Immunology ROS: negative for - hives  Hematological and Lymphatic ROS: negative for - bleeding problems or bruising, negative for palpable nodes Endocrine ROS: negative for - heat or cold intolerance, hair changes Respiratory ROS: negative for - cough, shortness of breath or wheezing Cardiovascular ROS: no chest pain or palpitations GI ROS: negative for nausea, vomiting, abdominal pain, diarrhea,  constipation Musculoskeletal ROS: negative for - joint swelling or muscle pain Neurological ROS: negative for - confusion, syncope.  Positive for headaches Dermatological ROS: negative for pruritus and rash Psychiatric: negative for anxiety, depression, difficulty sleeping and memory loss  MEDICATIONS: Current Medications  Current Outpatient Medications  Medication Sig Dispense Refill  . albuterol 90 mcg/actuation inhaler INHALE 2 INHALATIONS INTO THE LUNGS EVERY 4 HOURS AS NEEDED FOR WHEEZING 6.7 g 0  . aspirin 81 MG EC tablet Take 162 mg by mouth once       . azelastine (ASTELIN) 137 mcg nasal spray Place 1 spray into both nostrils 2 (two) times daily 30 mL 11  . CALCIUM CARBONATE ORAL Take 250 mg by mouth once daily    . cholecalciferol, vitamin D3, (VITAMIN D3) 125 mcg (5,000 unit) tablet Take 5,000 Units by mouth once daily       . coenzyme Q10 10 mg capsule Take 10 mg by mouth once daily.    . cyanocobalamin (VITAMIN B12) 1000 MCG tablet Take 1,000 mcg by mouth once daily    . guaiFENesin (MUCINEX) 600 mg SR tablet Take 600 mg by mouth every 12 (twelve) hours as needed for Cough    . lisinopriL (ZESTRIL) 20 MG tablet TAKE 1 TABLET BY MOUTH ONCE A DAY 30 tablet 0  . lovastatin (MEVACOR) 20 MG tablet Take 1 tablet (20 mg total) by mouth nightly 30 tablet 3  . magnesium citrate oral solution Take 296 mLs by mouth every other day       . meloxicam (MOBIC) 15 MG tablet Take 15 mg by mouth  once daily    . metFORMIN (GLUCOPHAGE-XR) 500 MG XR tablet Take 1 tablet (500 mg total) by mouth 2 (two) times daily with meals 60 tablet 3  . montelukast (SINGULAIR) 10 mg tablet Take 1 tablet (10 mg total) by mouth nightly 30 tablet 3  . multivitamin tablet Take 1 tablet by mouth once daily.    . potassium 99 mg Tab Take 1 tablet by mouth once daily.    . pseudoephedrine (SUDAFED) 30 mg tablet Take 30 mg by mouth every 4 (four) hours as needed for Congestion Per patient    .  tadalafil (CIALIS) 20 MG tablet Take 20 mg by mouth once as needed for Erectile Dysfunction.     . tamsulosin (FLOMAX) 0.4 mg capsule Take 0.4 mg by mouth once daily.     Marland Kitchen testosterone cypionate (DEPO-TESTOSTERONE) 200 mg/mL injection Inject 0.6 mg into the muscle every 7 (seven) days.     No current facility-administered medications for this visit.      ALLERGIES: Amlodipine, Azithromycin, Ciprofloxacin, Hydrocodone, Iodinated contrast media, Metformin, Ioxaglate sodium, and Metrizamide  PAST MEDICAL HISTORY:     Past Medical History:  Diagnosis Date  . Allergy   . 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 Erectile dysfunction   . History of polycythemia   . Hypertension   . Hypogonadism in male    on testerone therapy  . Sleep apnea     PAST SURGICAL HISTORY:      Past Surgical History:  Procedure Laterality Date  . COLONOSCOPY  07/07/2013   CBF: 06/2018 PH Adenomatous Polyp; [facility] (Father) Recall ltr mailed 06/14/18  . COLONOSCOPY  10/22/2008   PH Adenomatous Polyps   . COLONOSCOPY  07/14/2018   PH Adenmatous Polyp: CBF 06/2023  . nasal surgery       FAMILY HISTORY: Family History  Problem Relation Age of Onset  . Colon cancer Father   . High blood pressure (Hypertension) Father   . Diabetes Father   . Diabetes Brother      SOCIAL HISTORY: Social History          Socioeconomic History  . Marital status: Married    Spouse name: Not on file  . Number of children: Not on file  . Years of education: Not on file  . Highest education level: Not on file  Occupational History  . Not on file  Tobacco Use  . Smoking status: Never Smoker  . Smokeless tobacco: Never Used  Vaping Use  . Vaping Use: Never used  Substance and Sexual Activity  . Alcohol use: Yes    Comment: 1 or 2 times a month  . Drug use: Never  . Sexual activity: Yes    Partners: Female    Birth control/protection:  None    Comment: Wife  Other Topics Concern  . 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 Difficulty of Paying Living Expenses:   Food Insecurity:   . Worried About Charity fundraiser in the Last Year:   . Arboriculturist in the Last Year:   Transportation Needs:   . Film/video editor (Medical):   Marland Kitchen Lack  of Transportation (Non-Medical):       PHYSICAL EXAM:    Vitals:   04/26/20 0817  BP: 116/69  Pulse: 87   Mckinney mass index is 28.12 kg/m. Weight: (!) 102.1 kg (225 lb)   GENERAL: Alert, active, oriented x3  HEENT: Pupils equal reactive to light. Extraocular movements are intact. Sclera clear. Palpebral conjunctiva normal red color.Pharynx clear.  NECK: Supple with no palpable mass and no adenopathy.  LUNGS: Sound clear with no rales rhonchi or wheezes.  HEART: Regular rhythm S1 and S2 without murmur.  ABDOMEN: Soft and depressible, nontender with no palpable mass, no hepatomegaly.  Umbilical hernia, reducible, mild tender.  EXTREMITIES: Well-developed well-nourished symmetrical with no dependent edema.  NEUROLOGICAL: Awake alert oriented, facial expression symmetrical, moving all extremities.  REVIEW OF DATA: I have reviewed the following data today:      Appointment on 04/04/2020  Component Date Value  . Glucose 04/04/2020 163*  . Sodium 04/04/2020 138   . Potassium 04/04/2020 4.4   . Chloride 04/04/2020 101   . Carbon Dioxide (CO2) 04/04/2020 29.6   . Urea Nitrogen (BUN) 04/04/2020 21   . Creatinine 04/04/2020 1.0   . Glomerular Filtration Ra* 04/04/2020 77   . Calcium 04/04/2020 8.5*  . AST  04/04/2020 29   . ALT  04/04/2020 39   . Alk Phos (alkaline Phosp*  04/04/2020 77   . Albumin 04/04/2020 4.4   . Bilirubin, Total 04/04/2020 0.7   . Protein, Total 04/04/2020 6.4   . A/G Ratio 04/04/2020 2.2   . Hemoglobin A1C 04/04/2020 7.3*  . Average Blood Glucose (C* 04/04/2020 163   . Cholesterol, Total 04/04/2020 194   . Triglyceride 04/04/2020 208*  . HDL (High Density Lipopr* 04/04/2020 40.4   . LDL Calculated 04/04/2020 112   . VLDL Cholesterol 04/04/2020 42   . Cholesterol/HDL Ratio 04/04/2020 4.8   . WBC (White Blood Cell Co* 04/04/2020 6.1   . RBC (Red Blood Cell Coun* 04/04/2020 5.82   . Hemoglobin 04/04/2020 18.5*  . Hematocrit 04/04/2020 53.2*  . MCV (Mean Corpuscular Vo* 04/04/2020 91.4   . MCH (Mean Corpuscular He* 04/04/2020 31.8*  . MCHC (Mean Corpuscular H* 04/04/2020 34.8   . Platelet Count 04/04/2020 213   . RDW-CV (Red Cell Distrib* 04/04/2020 13.1   . MPV (Mean Platelet Volum* 04/04/2020 10.6   . Neutrophils 04/04/2020 3.83   . Lymphocytes 04/04/2020 1.66   . Monocytes 04/04/2020 0.47   . Eosinophils 04/04/2020 0.06   . Basophils 04/04/2020 0.03   . Neutrophil % 04/04/2020 63.2   . Lymphocyte % 04/04/2020 27.3   . Monocyte % 04/04/2020 7.7   . Eosinophil % 04/04/2020 1.0   . Basophil% 04/04/2020 0.5   . Immature Granulocyte % 04/04/2020 0.3   . Immature Granulocyte Cou* 04/04/2020 0.02   Office Visit on 03/04/2020  Component Date Value  . ID Now [id number] - [facility]* 03/04/2020 Negative      ASSESSMENT: Mr. Liuzzi is a 59 y.o. male presenting for consultation for umbilical hernia hernia.    The patient presents with a symptomatic, reducible umbilical hernia. Patient was oriented about the diagnosis of umbilical hernia and its implication. The patient was oriented about the treatment alternatives (observation vs surgical repair). Due to patient symptoms, it is reasonable to continue with observation or proceeding with hernia repair.  Patient reported that since he is starting to have more discomfort at work he will  prefer to have it fixed now very small and is an  easier fix than waiting until he gets worse.  Patient oriented about the surgical procedure, the use of mesh and its risk of complications such as: infection, bleeding, injury to vasculature, injury to bowel or bladder, and chronic pain.  Umbilical hernia without obstruction and without gangrene [K42.9]  PLAN: 1. Robotic assisted laparoscopic umbilical hernia repair with mesh (88358) 2.  CBC, CMP (done) 3.  Avoid taking aspirin 5 days before procedure 4.  Contact us if has any question or concern.  Patient and his wife verbalized understanding, all questions were answered, and were agreeable with the plan outlined above.    Herbert Pun, MD  Electronically signed by Herbert Pun, MD

## 2020-04-26 NOTE — H&P (View-Only) (Signed)
PATIENT PROFILE: Braxxton Stoudt is a 59 y.o. male who presents to the Clinic for consultation at the request of Venetia Maxon, [place] for evaluation of umbilical hernia.  PCP:  Dion Body, MD  HISTORY OF PRESENT ILLNESS: Mr. Berkovich reports having umbilical hernia for years.  He reports that recently in the past few months he has been having more discomfort and pain on the periumbilical area.  The pain does not radiate to the part of the body.  The pain is aggravated by heavy lifting and doing certain abdominal wall movements at work.  There is no alleviating factors.  Patient denies any abdominal distention nausea or vomiting.  Patient denies any previous abdominal surgeries.  Patient reports having 2 episode of bowel obstruction in the past.  He was diagnosed with GI dysfunction.   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 Alyson Ingles 12/27/2015   Low testosterone - followed by Dr. Alyson Ingles 12/27/2015   OSA on CPAP Unknown   Essential hypertension Unknown      GENERAL REVIEW OF SYSTEMS:   General ROS: negative for - chills, fatigue, fever, weight gain or weight loss Allergy and Immunology ROS: negative for - hives  Hematological and Lymphatic ROS: negative for - bleeding problems or bruising, negative for palpable nodes Endocrine ROS: negative for - heat or cold intolerance, hair changes Respiratory ROS: negative for - cough, shortness of breath or wheezing Cardiovascular ROS: no chest pain or palpitations GI ROS: negative for nausea, vomiting, abdominal pain, diarrhea,  constipation Musculoskeletal ROS: negative for - joint swelling or muscle pain Neurological ROS: negative for - confusion, syncope.  Positive for headaches Dermatological ROS: negative for pruritus and rash Psychiatric: negative for anxiety, depression, difficulty sleeping and memory loss  MEDICATIONS: Current Medications  Current Outpatient Medications  Medication Sig Dispense Refill  . albuterol 90 mcg/actuation inhaler INHALE 2 INHALATIONS INTO THE LUNGS EVERY 4 HOURS AS NEEDED FOR WHEEZING 6.7 g 0  . aspirin 81 MG EC tablet Take 162 mg by mouth once       . azelastine (ASTELIN) 137 mcg nasal spray Place 1 spray into both nostrils 2 (two) times daily 30 mL 11  . CALCIUM CARBONATE ORAL Take 250 mg by mouth once daily    . cholecalciferol, vitamin D3, (VITAMIN D3) 125 mcg (5,000 unit) tablet Take 5,000 Units by mouth once daily       . coenzyme Q10 10 mg capsule Take 10 mg by mouth once daily.    . cyanocobalamin (VITAMIN B12) 1000 MCG tablet Take 1,000 mcg by mouth once daily    . guaiFENesin (MUCINEX) 600 mg SR tablet Take 600 mg by mouth every 12 (twelve) hours as needed for Cough    . lisinopriL (ZESTRIL) 20 MG tablet TAKE 1 TABLET BY MOUTH ONCE A DAY 30 tablet 0  . lovastatin (MEVACOR) 20 MG tablet Take 1 tablet (20 mg total) by mouth nightly 30 tablet 3  . magnesium citrate oral solution Take 296 mLs by mouth every other day       . meloxicam (MOBIC) 15 MG tablet Take 15 mg by mouth  once daily    . metFORMIN (GLUCOPHAGE-XR) 500 MG XR tablet Take 1 tablet (500 mg total) by mouth 2 (two) times daily with meals 60 tablet 3  . montelukast (SINGULAIR) 10 mg tablet Take 1 tablet (10 mg total) by mouth nightly 30 tablet 3  . multivitamin tablet Take 1 tablet by mouth once daily.    . potassium 99 mg Tab Take 1 tablet by mouth once daily.    . pseudoephedrine (SUDAFED) 30 mg tablet Take 30 mg by mouth every 4 (four) hours as needed for Congestion Per patient    .  tadalafil (CIALIS) 20 MG tablet Take 20 mg by mouth once as needed for Erectile Dysfunction.     . tamsulosin (FLOMAX) 0.4 mg capsule Take 0.4 mg by mouth once daily.     Marland Kitchen testosterone cypionate (DEPO-TESTOSTERONE) 200 mg/mL injection Inject 0.6 mg into the muscle every 7 (seven) days.     No current facility-administered medications for this visit.      ALLERGIES: Amlodipine, Azithromycin, Ciprofloxacin, Hydrocodone, Iodinated contrast media, Metformin, Ioxaglate sodium, and Metrizamide  PAST MEDICAL HISTORY:     Past Medical History:  Diagnosis Date  . Allergy   . 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 Erectile dysfunction   . History of polycythemia   . Hypertension   . Hypogonadism in male    on testerone therapy  . Sleep apnea     PAST SURGICAL HISTORY:      Past Surgical History:  Procedure Laterality Date  . COLONOSCOPY  07/07/2013   CBF: 06/2018 PH Adenomatous Polyp; [facility] (Father) Recall ltr mailed 06/14/18  . COLONOSCOPY  10/22/2008   PH Adenomatous Polyps   . COLONOSCOPY  07/14/2018   PH Adenmatous Polyp: CBF 06/2023  . nasal surgery       FAMILY HISTORY: Family History  Problem Relation Age of Onset  . Colon cancer Father   . High blood pressure (Hypertension) Father   . Diabetes Father   . Diabetes Brother      SOCIAL HISTORY: Social History          Socioeconomic History  . Marital status: Married    Spouse name: Not on file  . Number of children: Not on file  . Years of education: Not on file  . Highest education level: Not on file  Occupational History  . Not on file  Tobacco Use  . Smoking status: Never Smoker  . Smokeless tobacco: Never Used  Vaping Use  . Vaping Use: Never used  Substance and Sexual Activity  . Alcohol use: Yes    Comment: 1 or 2 times a month  . Drug use: Never  . Sexual activity: Yes    Partners: Female    Birth control/protection:  None    Comment: Wife  Other Topics Concern  . 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 Difficulty of Paying Living Expenses:   Food Insecurity:   . Worried About Charity fundraiser in the Last Year:   . Arboriculturist in the Last Year:   Transportation Needs:   . Film/video editor (Medical):   Marland Kitchen Lack  of Transportation (Non-Medical):       PHYSICAL EXAM:    Vitals:   04/26/20 0817  BP: 116/69  Pulse: 87   Body mass index is 28.12 kg/m. Weight: (!) 102.1 kg (225 lb)   GENERAL: Alert, active, oriented x3  HEENT: Pupils equal reactive to light. Extraocular movements are intact. Sclera clear. Palpebral conjunctiva normal red color.Pharynx clear.  NECK: Supple with no palpable mass and no adenopathy.  LUNGS: Sound clear with no rales rhonchi or wheezes.  HEART: Regular rhythm S1 and S2 without murmur.  ABDOMEN: Soft and depressible, nontender with no palpable mass, no hepatomegaly.  Umbilical hernia, reducible, mild tender.  EXTREMITIES: Well-developed well-nourished symmetrical with no dependent edema.  NEUROLOGICAL: Awake alert oriented, facial expression symmetrical, moving all extremities.  REVIEW OF DATA: I have reviewed the following data today:      Appointment on 04/04/2020  Component Date Value  . Glucose 04/04/2020 163*  . Sodium 04/04/2020 138   . Potassium 04/04/2020 4.4   . Chloride 04/04/2020 101   . Carbon Dioxide (CO2) 04/04/2020 29.6   . Urea Nitrogen (BUN) 04/04/2020 21   . Creatinine 04/04/2020 1.0   . Glomerular Filtration Ra* 04/04/2020 77   . Calcium 04/04/2020 8.5*  . AST  04/04/2020 29   . ALT  04/04/2020 39   . Alk Phos (alkaline Phosp*  04/04/2020 77   . Albumin 04/04/2020 4.4   . Bilirubin, Total 04/04/2020 0.7   . Protein, Total 04/04/2020 6.4   . A/G Ratio 04/04/2020 2.2   . Hemoglobin A1C 04/04/2020 7.3*  . Average Blood Glucose (C* 04/04/2020 163   . Cholesterol, Total 04/04/2020 194   . Triglyceride 04/04/2020 208*  . HDL (High Density Lipopr* 04/04/2020 40.4   . LDL Calculated 04/04/2020 112   . VLDL Cholesterol 04/04/2020 42   . Cholesterol/HDL Ratio 04/04/2020 4.8   . WBC (White Blood Cell Co* 04/04/2020 6.1   . RBC (Red Blood Cell Coun* 04/04/2020 5.82   . Hemoglobin 04/04/2020 18.5*  . Hematocrit 04/04/2020 53.2*  . MCV (Mean Corpuscular Vo* 04/04/2020 91.4   . MCH (Mean Corpuscular He* 04/04/2020 31.8*  . MCHC (Mean Corpuscular H* 04/04/2020 34.8   . Platelet Count 04/04/2020 213   . RDW-CV (Red Cell Distrib* 04/04/2020 13.1   . MPV (Mean Platelet Volum* 04/04/2020 10.6   . Neutrophils 04/04/2020 3.83   . Lymphocytes 04/04/2020 1.66   . Monocytes 04/04/2020 0.47   . Eosinophils 04/04/2020 0.06   . Basophils 04/04/2020 0.03   . Neutrophil % 04/04/2020 63.2   . Lymphocyte % 04/04/2020 27.3   . Monocyte % 04/04/2020 7.7   . Eosinophil % 04/04/2020 1.0   . Basophil% 04/04/2020 0.5   . Immature Granulocyte % 04/04/2020 0.3   . Immature Granulocyte Cou* 04/04/2020 0.02   Office Visit on 03/04/2020  Component Date Value  . ID Now [id number] - [facility]* 03/04/2020 Negative      ASSESSMENT: Mr. Chandley is a 59 y.o. male presenting for consultation for umbilical hernia hernia.    The patient presents with a symptomatic, reducible umbilical hernia. Patient was oriented about the diagnosis of umbilical hernia and its implication. The patient was oriented about the treatment alternatives (observation vs surgical repair). Due to patient symptoms, it is reasonable to continue with observation or proceeding with hernia repair.  Patient reported that since he is starting to have more discomfort at work he will  prefer to have it fixed now very small and is an  easier fix than waiting until he gets worse.  Patient oriented about the surgical procedure, the use of mesh and its risk of complications such as: infection, bleeding, injury to vasculature, injury to bowel or bladder, and chronic pain.  Umbilical hernia without obstruction and without gangrene [K42.9]  PLAN: 1. Robotic assisted laparoscopic umbilical hernia repair with mesh ([id number]) 2.  CBC, CMP (done) 3.  Avoid taking aspirin 5 days before procedure 4.  Contact us if has any question or concern.  Patient and his wife verbalized understanding, all questions were answered, and were agreeable with the plan outlined above.    Herbert Pun, MD  Electronically signed by Herbert Pun, MD

## 2020-05-06 ENCOUNTER — Inpatient Hospital Stay: Admission: RE | Admit: 2020-05-06 | Payer: BC Managed Care – PPO | Source: Ambulatory Visit

## 2020-05-07 ENCOUNTER — Encounter
Admission: RE | Admit: 2020-05-07 | Discharge: 2020-05-07 | Disposition: A | Discharge status: Home / Self Care | Payer: BC Managed Care – PPO | Source: Ambulatory Visit | Attending: General Surgery | Admitting: General Surgery

## 2020-05-07 ENCOUNTER — Other Ambulatory Visit: Payer: Self-pay

## 2020-05-07 DIAGNOSIS — Z01818 Encounter for other preprocedural examination: Secondary | ICD-10-CM | POA: Insufficient documentation

## 2020-05-07 DIAGNOSIS — I10 Essential (primary) hypertension: Secondary | ICD-10-CM | POA: Diagnosis not present

## 2020-05-07 DIAGNOSIS — E119 Type 2 diabetes mellitus without complications: Secondary | ICD-10-CM | POA: Insufficient documentation

## 2020-05-07 HISTORY — DX: Personal history of other diseases of the digestive system: Z87.19

## 2020-05-07 NOTE — Patient Instructions (Signed)
Your procedure is scheduled on: Wed. 9/22 Report to Day Surgery. To find out your arrival time please call [phone] between 1PM - 3PM on .Tues 9/21  Remember: Instructions that are not followed completely may result in serious medical risk,  up to and including death, or upon the discretion of your surgeon and anesthesiologist your  surgery may need to be rescheduled.     _X__ 1. Do not eat food after midnight the night before your procedure.                 No chewing gum or hard candies. You may drink clear liquids up to 2 hours                 before you are scheduled to arrive for your surgery- DO not drink clear                 liquids within 2 hours of the start of your surgery.                 Clear Liquids include:  water, G2 or                  Gatorade Zero, Black Coffee or Tea (Do not add                 anything to coffee or tea). _____2.   Complete the "Ensure Clear Pre-surgery Clear Carbohydrate Drink" provided to you, 2 hours before arrival. **If you       are diabetic you will be provided with an alternative drink, Gatorade Zero or G2.  __X__2.  On the morning of surgery brush your teeth with toothpaste and water, you                may rinse your mouth with mouthwash if you wish.  Do not swallow any toothpaste of mouthwash.     _X__ 3.  No Alcohol for 24 hours before or after surgery.   ___ 4.  Do Not Smoke or use e-cigarettes For 24 Hours Prior to Your Surgery.                 Do not use any chewable tobacco products for at least 6 hours prior to                 Surgery.  ___  5.  Do not use any recreational drugs (marijuana, cocaine, heroin, ecstasy, MDMA or other)                For at least one week prior to your surgery.  Combination of these drugs with anesthesia                May have life threatening results.  ____  6.  Bring all medications with you on the day of surgery if instructed.   _x___  7.  Notify your doctor if there is  any change in your medical condition      (cold, fever, infections).     Do not wear jewelry,  Do not wear lotions, . You may wear deodorant. Do not shave 48 hours prior to surgery. Men may shave face and neck. Do not bring valuables to the hospital.    [facility] is not responsible for any belongings or valuables.  Contacts, dentures or bridgework may not be worn into surgery. Leave your suitcase in the car. After surgery it may be brought to your room. For patients  admitted to the hospital, discharge time is determined by your treatment team.   Patients discharged the day of surgery will not be allowed to drive home.   Make arrangements for someone to be with you for the first 24 hours of your Same Day Discharge.    Please read over the following fact sheets that you were given:     __x__ Take these medicines the morning of surgery with A SIP OF WATER:    1. acetaminophen (TYLENOL) 500 MG tablet if needed  2. Allergy medication and nasal spray if needed  3.   4.  5.  6.  ____ Fleet Enema (as directed)   __x__ Use CHG Soap (or wipes) as directed  ____ Use Benzoyl Peroxide Gel as instructed  __x__ Use inhalers on the day of surgery albuterol (VENTOLIN HFA) 108 (90 Base) MCG/ACT inhaler and bring with you  __x__ Stop metformin 2 days prior to surgery  Last dose on 9/19    ____ Take 1/2 of usual insulin dose the night before surgery. No insulin the morning          of surgery.   _x___ Stop aspirin tomorrow   _x___ Stop Anti-inflammatories ibuprofen (ADVIL) 200 MG tablet,MOBIC 15 MG tablet aleve and aspirin products tomorrow   _x___ Stop supplements until after surgery.  Coenzyme Q10 (COQ10) 200 MG CAPS  __x__ Bring C-Pap to the hospital.    If you have any questions regarding your pre-procedure instructions,  Please call Pre-admit Testing at [place]

## 2020-05-09 ENCOUNTER — Other Ambulatory Visit: Payer: BC Managed Care – PPO

## 2020-05-13 ENCOUNTER — Encounter
Admission: RE | Admit: 2020-05-13 | Discharge: 2020-05-13 | Disposition: A | Discharge status: Home / Self Care | Payer: BC Managed Care – PPO | Source: Ambulatory Visit | Attending: General Surgery | Admitting: General Surgery

## 2020-05-13 ENCOUNTER — Other Ambulatory Visit: Payer: Self-pay

## 2020-05-13 ENCOUNTER — Other Ambulatory Visit
Admission: RE | Admit: 2020-05-13 | Discharge: 2020-05-13 | Disposition: A | Discharge status: Home / Self Care | Payer: BC Managed Care – PPO | Source: Ambulatory Visit | Attending: General Surgery | Admitting: General Surgery

## 2020-05-13 DIAGNOSIS — Z01818 Encounter for other preprocedural examination: Secondary | ICD-10-CM | POA: Insufficient documentation

## 2020-05-13 DIAGNOSIS — I10 Essential (primary) hypertension: Secondary | ICD-10-CM | POA: Diagnosis not present

## 2020-05-13 DIAGNOSIS — Z20822 Contact with and (suspected) exposure to covid-19: Secondary | ICD-10-CM | POA: Diagnosis not present

## 2020-05-13 DIAGNOSIS — E118 Type 2 diabetes mellitus with unspecified complications: Secondary | ICD-10-CM | POA: Insufficient documentation

## 2020-05-14 LAB — SARS CORONAVIRUS 2 (TAT 6-24 HRS): SARS Coronavirus 2: NEGATIVE

## 2020-05-15 ENCOUNTER — Ambulatory Visit
Admission: RE | Admit: 2020-05-15 | Discharge: 2020-05-15 | Disposition: A | Discharge status: Home / Self Care | Payer: BC Managed Care – PPO | Attending: General Surgery | Admitting: General Surgery

## 2020-05-15 ENCOUNTER — Encounter
Admission: RE | Disposition: A | Discharge status: Home / Self Care | Payer: Self-pay | Source: Home / Self Care | Attending: General Surgery

## 2020-05-15 ENCOUNTER — Ambulatory Visit: Payer: BC Managed Care – PPO | Admitting: Anesthesiology

## 2020-05-15 DIAGNOSIS — Z79899 Other long term (current) drug therapy: Secondary | ICD-10-CM | POA: Insufficient documentation

## 2020-05-15 DIAGNOSIS — Z7982 Long term (current) use of aspirin: Secondary | ICD-10-CM | POA: Insufficient documentation

## 2020-05-15 DIAGNOSIS — I10 Essential (primary) hypertension: Secondary | ICD-10-CM | POA: Insufficient documentation

## 2020-05-15 DIAGNOSIS — N529 Male erectile dysfunction, unspecified: Secondary | ICD-10-CM | POA: Diagnosis not present

## 2020-05-15 DIAGNOSIS — K429 Umbilical hernia without obstruction or gangrene: Secondary | ICD-10-CM | POA: Diagnosis not present

## 2020-05-15 DIAGNOSIS — Z791 Long term (current) use of non-steroidal anti-inflammatories (NSAID): Secondary | ICD-10-CM | POA: Diagnosis not present

## 2020-05-15 DIAGNOSIS — E119 Type 2 diabetes mellitus without complications: Secondary | ICD-10-CM | POA: Insufficient documentation

## 2020-05-15 DIAGNOSIS — Z7984 Long term (current) use of oral hypoglycemic drugs: Secondary | ICD-10-CM | POA: Insufficient documentation

## 2020-05-15 DIAGNOSIS — G4733 Obstructive sleep apnea (adult) (pediatric): Secondary | ICD-10-CM | POA: Diagnosis not present

## 2020-05-15 LAB — GLUCOSE, CAPILLARY: Glucose-Capillary: 121 mg/dL — ABNORMAL HIGH (ref 70–99)

## 2020-05-15 SURGERY — REPAIR, HERNIA, UMBILICAL, ROBOT-ASSISTED
Anesthesia: General | Site: Abdomen

## 2020-05-15 MED ORDER — EPHEDRINE SULFATE 50 MG/ML IJ SOLN
INTRAMUSCULAR | Status: DC | PRN
  Administered 2020-05-15 (×3): 5 mg via INTRAVENOUS

## 2020-05-15 MED ORDER — PROPOFOL 10 MG/ML IV BOLUS
INTRAVENOUS | Status: DC | PRN
  Administered 2020-05-15: 200 mg via INTRAVENOUS

## 2020-05-15 MED ORDER — TRAMADOL HCL 50 MG PO TABS
ORAL_TABLET | ORAL | Status: AC
  Administered 2020-05-15: 50 mg via ORAL
  Filled 2020-05-15: qty 1

## 2020-05-15 MED ORDER — SODIUM CHLORIDE 0.9 % IV SOLN: INTRAVENOUS | Status: DC

## 2020-05-15 MED ORDER — CEFAZOLIN SODIUM-DEXTROSE 2-4 GM/100ML-% IV SOLN
INTRAVENOUS | Status: AC
  Filled 2020-05-15: qty 100

## 2020-05-15 MED ORDER — GLYCOPYRROLATE 0.2 MG/ML IJ SOLN
INTRAMUSCULAR | Status: AC
  Filled 2020-05-15: qty 1

## 2020-05-15 MED ORDER — DEXAMETHASONE SODIUM PHOSPHATE 10 MG/ML IJ SOLN
INTRAMUSCULAR | Status: DC | PRN
  Administered 2020-05-15: 10 mg via INTRAVENOUS

## 2020-05-15 MED ORDER — FENTANYL CITRATE (PF) 100 MCG/2ML IJ SOLN
INTRAMUSCULAR | Status: AC
  Filled 2020-05-15: qty 2

## 2020-05-15 MED ORDER — BUPIVACAINE-EPINEPHRINE (PF) 0.25% -1:200000 IJ SOLN
INTRAMUSCULAR | Status: DC | PRN
  Administered 2020-05-15: 20 mL via PERINEURAL
  Administered 2020-05-15: 10 mL via PERINEURAL

## 2020-05-15 MED ORDER — ACETAMINOPHEN 10 MG/ML IV SOLN
INTRAVENOUS | Status: AC
  Filled 2020-05-15: qty 100

## 2020-05-15 MED ORDER — ROCURONIUM BROMIDE 10 MG/ML (PF) SYRINGE
PREFILLED_SYRINGE | INTRAVENOUS | Status: AC
  Filled 2020-05-15: qty 10

## 2020-05-15 MED ORDER — EPHEDRINE 5 MG/ML INJ
INTRAVENOUS | Status: AC
  Filled 2020-05-15: qty 10

## 2020-05-15 MED ORDER — ORAL CARE MOUTH RINSE: 15.0000 mL | Freq: Once | OROMUCOSAL | Status: AC

## 2020-05-15 MED ORDER — SEVOFLURANE IN SOLN
RESPIRATORY_TRACT | Status: AC
  Filled 2020-05-15: qty 250

## 2020-05-15 MED ORDER — CHLORHEXIDINE GLUCONATE 0.12 % MT SOLN
OROMUCOSAL | Status: AC
  Administered 2020-05-15: 15 mL via OROMUCOSAL
  Filled 2020-05-15: qty 15

## 2020-05-15 MED ORDER — PROPOFOL 500 MG/50ML IV EMUL
INTRAVENOUS | Status: AC
  Filled 2020-05-15: qty 50

## 2020-05-15 MED ORDER — ONDANSETRON HCL 4 MG/2ML IJ SOLN
INTRAMUSCULAR | Status: AC
  Administered 2020-05-15: 4 mg
  Filled 2020-05-15: qty 2

## 2020-05-15 MED ORDER — FENTANYL CITRATE (PF) 100 MCG/2ML IJ SOLN
INTRAMUSCULAR | Status: DC | PRN
Start: 2020-05-15 — End: 2020-05-15
  Administered 2020-05-15: 100 ug via INTRAVENOUS
  Administered 2020-05-15 (×2): 50 ug via INTRAVENOUS

## 2020-05-15 MED ORDER — TRAMADOL HCL 50 MG PO TABS: 50.0000 mg | ORAL_TABLET | Freq: Four times a day (QID) | ORAL | 0 refills | Status: AC | PRN

## 2020-05-15 MED ORDER — FAMOTIDINE 20 MG PO TABS
ORAL_TABLET | ORAL | Status: AC
  Administered 2020-05-15: 20 mg via ORAL
  Filled 2020-05-15: qty 1

## 2020-05-15 MED ORDER — FAMOTIDINE 20 MG PO TABS: 20.0000 mg | ORAL_TABLET | Freq: Once | ORAL | Status: AC

## 2020-05-15 MED ORDER — TRAMADOL HCL 50 MG PO TABS: 50.0000 mg | ORAL_TABLET | Freq: Four times a day (QID) | ORAL | Status: DC | PRN

## 2020-05-15 MED ORDER — ONDANSETRON HCL 4 MG/2ML IJ SOLN
INTRAMUSCULAR | Status: AC
  Filled 2020-05-15: qty 2

## 2020-05-15 MED ORDER — SUGAMMADEX SODIUM 200 MG/2ML IV SOLN
INTRAVENOUS | Status: DC | PRN
  Administered 2020-05-15: 200 mg via INTRAVENOUS
  Administered 2020-05-15: 50 mg via INTRAVENOUS

## 2020-05-15 MED ORDER — GLYCOPYRROLATE 0.2 MG/ML IJ SOLN
INTRAMUSCULAR | Status: DC | PRN
  Administered 2020-05-15: .2 mg via INTRAVENOUS

## 2020-05-15 MED ORDER — CHLORHEXIDINE GLUCONATE 0.12 % MT SOLN
OROMUCOSAL | Status: AC
  Filled 2020-05-15: qty 15

## 2020-05-15 MED ORDER — MIDAZOLAM HCL 2 MG/2ML IJ SOLN
INTRAMUSCULAR | Status: AC
  Filled 2020-05-15: qty 2

## 2020-05-15 MED ORDER — LIDOCAINE HCL (CARDIAC) PF 100 MG/5ML IV SOSY
PREFILLED_SYRINGE | INTRAVENOUS | Status: DC | PRN
  Administered 2020-05-15: 100 mg via INTRAVENOUS

## 2020-05-15 MED ORDER — CEFAZOLIN SODIUM-DEXTROSE 2-4 GM/100ML-% IV SOLN
2.0000 g | INTRAVENOUS | Status: AC
  Administered 2020-05-15: 2 g via INTRAVENOUS

## 2020-05-15 MED ORDER — DEXMEDETOMIDINE HCL 200 MCG/2ML IV SOLN
INTRAVENOUS | Status: DC | PRN
  Administered 2020-05-15: 4 ug via INTRAVENOUS
  Administered 2020-05-15: 8 ug via INTRAVENOUS

## 2020-05-15 MED ORDER — FAMOTIDINE 20 MG PO TABS
ORAL_TABLET | ORAL | Status: AC
  Filled 2020-05-15: qty 1

## 2020-05-15 MED ORDER — TRAMADOL HCL 50 MG PO TABS: 50.0000 mg | ORAL_TABLET | Freq: Four times a day (QID) | ORAL | 0 refills | Status: DC | PRN

## 2020-05-15 MED ORDER — ACETAMINOPHEN 10 MG/ML IV SOLN
INTRAVENOUS | Status: DC | PRN
  Administered 2020-05-15: 1000 mg via INTRAVENOUS

## 2020-05-15 MED ORDER — FENTANYL CITRATE (PF) 100 MCG/2ML IJ SOLN
25.0000 ug | INTRAMUSCULAR | Status: DC | PRN
  Administered 2020-05-15 (×3): 50 ug via INTRAVENOUS

## 2020-05-15 MED ORDER — DEXAMETHASONE SODIUM PHOSPHATE 10 MG/ML IJ SOLN
INTRAMUSCULAR | Status: AC
  Filled 2020-05-15: qty 1

## 2020-05-15 MED ORDER — PHENYLEPHRINE HCL (PRESSORS) 10 MG/ML IV SOLN
INTRAVENOUS | Status: DC | PRN
  Administered 2020-05-15: 50 ug via INTRAVENOUS

## 2020-05-15 MED ORDER — ONDANSETRON HCL 4 MG/2ML IJ SOLN
INTRAMUSCULAR | Status: DC | PRN
  Administered 2020-05-15: 4 mg via INTRAVENOUS

## 2020-05-15 MED ORDER — CHLORHEXIDINE GLUCONATE 0.12 % MT SOLN: 15.0000 mL | Freq: Once | OROMUCOSAL | Status: AC

## 2020-05-15 MED ORDER — ROCURONIUM BROMIDE 100 MG/10ML IV SOLN
INTRAVENOUS | Status: DC | PRN
  Administered 2020-05-15: 50 mg via INTRAVENOUS
  Administered 2020-05-15: 20 mg via INTRAVENOUS

## 2020-05-15 MED ORDER — MIDAZOLAM HCL 2 MG/2ML IJ SOLN
INTRAMUSCULAR | Status: DC | PRN
  Administered 2020-05-15: 2 mg via INTRAVENOUS

## 2020-05-15 MED ORDER — DEXMEDETOMIDINE (PRECEDEX) IN NS 20 MCG/5ML (4 MCG/ML) IV SYRINGE
PREFILLED_SYRINGE | INTRAVENOUS | Status: AC
  Filled 2020-05-15: qty 5

## 2020-05-15 SURGICAL SUPPLY — 47 items
ADH SKN CLS APL DERMABOND .7 (GAUZE/BANDAGES/DRESSINGS) ×1
APL PRP STRL LF DISP 70% ISPRP (MISCELLANEOUS) ×1
BAG INFUSER PRESSURE 100CC (MISCELLANEOUS) IMPLANT
BLADE SURG SZ11 CARB STEEL (BLADE) ×2 IMPLANT
CANISTER SUCT 1200ML W/VALVE (MISCELLANEOUS) ×2 IMPLANT
CHLORAPREP W/TINT 26 (MISCELLANEOUS) ×2 IMPLANT
COVER TIP SHEARS 8 DVNC (MISCELLANEOUS) ×1 IMPLANT
COVER TIP SHEARS 8MM DA VINCI (MISCELLANEOUS) ×1
COVER WAND RF STERILE (DRAPES) ×2 IMPLANT
DEFOGGER SCOPE WARMER CLEARIFY (MISCELLANEOUS) ×2 IMPLANT
DERMABOND ADVANCED (GAUZE/BANDAGES/DRESSINGS) ×1
DERMABOND ADVANCED .7 DNX12 (GAUZE/BANDAGES/DRESSINGS) ×1 IMPLANT
DRAPE ARM DVNC X/XI (DISPOSABLE) ×3 IMPLANT
DRAPE COLUMN DVNC XI (DISPOSABLE) ×1 IMPLANT
DRAPE DA VINCI XI ARM (DISPOSABLE) ×3
DRAPE DA VINCI XI COLUMN (DISPOSABLE) ×1
ELECT REM PT RETURN 9FT ADLT (ELECTROSURGICAL) ×2
ELECTRODE REM PT RTRN 9FT ADLT (ELECTROSURGICAL) ×1 IMPLANT
GLOVE BIO SURGEON STRL SZ 6.5 (GLOVE) ×4 IMPLANT
GLOVE BIOGEL PI IND STRL 6.5 (GLOVE) ×2 IMPLANT
GLOVE BIOGEL PI INDICATOR 6.5 (GLOVE) ×2
GOWN STRL REUS W/ TWL LRG LVL3 (GOWN DISPOSABLE) ×3 IMPLANT
GOWN STRL REUS W/TWL LRG LVL3 (GOWN DISPOSABLE) ×6
IRRIGATOR SUCT 8 DISP DVNC XI (IRRIGATION / IRRIGATOR) IMPLANT
IRRIGATOR SUCTION 8MM XI DISP (IRRIGATION / IRRIGATOR)
IV NS 1000ML (IV SOLUTION)
IV NS 1000ML BAXH (IV SOLUTION) IMPLANT
KIT PINK PAD W/HEAD ARE REST (MISCELLANEOUS) ×2
KIT PINK PAD W/HEAD ARM REST (MISCELLANEOUS) ×1 IMPLANT
LABEL OR SOLS (LABEL) ×2 IMPLANT
MESH PROGRIP HERNIA FLAT 15X15 (Mesh General) ×2 IMPLANT
NEEDLE HYPO 22GX1.5 SAFETY (NEEDLE) ×2 IMPLANT
NEEDLE INSUFFLATION 14GA 120MM (NEEDLE) ×2 IMPLANT
OBTURATOR OPTICAL STANDARD 8MM (TROCAR) ×1
OBTURATOR OPTICAL STND 8 DVNC (TROCAR) ×1
OBTURATOR OPTICALSTD 8 DVNC (TROCAR) ×1 IMPLANT
PACK LAP CHOLECYSTECTOMY (MISCELLANEOUS) ×2 IMPLANT
SEAL CANN UNIV 5-8 DVNC XI (MISCELLANEOUS) ×3 IMPLANT
SEAL XI 5MM-8MM UNIVERSAL (MISCELLANEOUS) ×3
SET TUBE SMOKE EVAC HIGH FLOW (TUBING) ×2 IMPLANT
SOLUTION ELECTROLUBE (MISCELLANEOUS) ×2 IMPLANT
SUT DVC VLOC 180 2-0 12IN GS21 (SUTURE) ×2
SUT MNCRL AB 4-0 PS2 18 (SUTURE) ×2 IMPLANT
SUT STRATAFIX PDS 30 CT-1 (SUTURE) ×2 IMPLANT
SUT VICRYL 0 AB UR-6 (SUTURE) ×2 IMPLANT
SUT VLOC 90 S/L VL9 GS22 (SUTURE) ×2 IMPLANT
SUTURE DVC VL 180 2-0 12INGS21 (SUTURE) ×1 IMPLANT

## 2020-05-15 NOTE — Anesthesia Preprocedure Evaluation (Addendum)
Anesthesia Evaluation  Patient identified by MRN, date of birth, ID band Patient awake    Reviewed: Allergy & Precautions, H&P , NPO status , Patient's Chart, lab work & pertinent test results  History of Anesthesia Complications Negative for: history of anesthetic complications  Airway Mallampati: III  TM Distance: <3 FB Neck ROM: limited    Dental  (+) Chipped, Poor Dentition, Missing   Pulmonary neg shortness of breath, sleep apnea and Continuous Positive Airway Pressure Ventilation ,    Pulmonary exam normal        Cardiovascular Exercise Tolerance: Good hypertension, (-) angina(-) Past MI and (-) DOE Normal cardiovascular exam     Neuro/Psych negative neurological ROS  negative psych ROS   GI/Hepatic Neg liver ROS, hiatal hernia, GERD  Medicated and Controlled,  Endo/Other  diabetes, Type 2, Oral Hypoglycemic Agents  Renal/GU      Musculoskeletal   Abdominal   Peds  Hematology negative hematology ROS (+)   Anesthesia Other Findings Past Medical History: No date: BPH (benign prostatic hyperplasia) No date: GERD (gastroesophageal reflux disease)     Comment:  no meds No date: History of hiatal hernia No date: Hypertension No date: Low testosterone No date: Polycythemia No date: Sleep apnea     Comment:  uses a cpap nightly  Past Surgical History: No date: COLONOSCOPY 06/20/2013: I & D EXTREMITY; Left     Comment:  Procedure: MINOR IRRIGATION AND DEBRIDEMENT LEFT INDEX               FINGER;  Surgeon: Tennis Must, MD;  Location: [place];  Service: Orthopedics;  Laterality:               Left; No date: NASAL SINUS SURGERY No date: UPPER GASTROINTESTINAL ENDOSCOPY No date: WISDOM TOOTH EXTRACTION     Reproductive/Obstetrics negative OB ROS                             Anesthesia Physical Anesthesia Plan  ASA: III  Anesthesia Plan: General  ETT   Post-op Pain Management:    Induction: Intravenous  PONV Risk Score and Plan: Ondansetron, Dexamethasone, Midazolam and Treatment may vary due to age or medical condition  Airway Management Planned: Oral ETT  Additional Equipment:   Intra-op Plan:   Post-operative Plan: Extubation in OR  Informed Consent: I have reviewed the patients History and Physical, chart, labs and discussed the procedure including the risks, benefits and alternatives for the proposed anesthesia with the patient or authorized representative who has indicated his/her understanding and acceptance.     Dental Advisory Given  Plan Discussed with: Anesthesiologist, CRNA and Surgeon  Anesthesia Plan Comments: (Patient consented for risks of anesthesia including but not limited to:  - adverse reactions to medications - damage to eyes, teeth, lips or other oral mucosa - nerve damage due to positioning  - sore throat or hoarseness - Damage to heart, brain, nerves, lungs, other parts of body or loss of life  Patient voiced understanding.)        Anesthesia Quick Evaluation

## 2020-05-15 NOTE — Op Note (Signed)
Preoperative diagnosis: Umbilical hernia  Postoperative diagnosis: Umbilical Hernia  Procedure: Robotic assisted laparoscopic transabdominal pre peritoneal umbilical hernia repair with mesh  Anesthesia: general  Surgeon: Herbert Pun, MD, FACS  Wound Classification: Clean  Specimen: none  Complications: None  Estimated Blood Loss: 5 mL  Indications: A 59 year old male with symptomatic umbilical hernia. Repair indicated to improve pain and avoid complications such as incarceration or strangulation.   Findings: 1. 2 cm umbilical hernia 4. Tension free repair achieved with 12 x 12 cm Progrip mesh and suture 5. Adequate hemostasis  Description of procedure: The patient was brought to the operating room and general anesthesia was induced. A time-out was completed verifying correct patient, procedure, site, positioning, and implant(s) and/or special equipment prior to beginning this procedure. Antibiotics were administered prior to making the incision. SCDs placed. The anterior abdominal wall was prepped and draped in the standard sterile fashion.   Palmer's point chosen for entry.  Veress needle placed and abdomen insufflated to 15cm without any dramatic increase in pressure.  Needle removed and optiview technique used to place 8 mm port at same point.  No injury noted during placement. 2 additional ports were placed along left lateral aspect.  Xi robot then docked into place.    A pre peritoneal flap was started 6 cm lateral to the umbilical defect. The pre peritoneal flap was extended 6 cm away from the hernia defect in all directions. The Hernia sac and content were reduced.   Insufflation dropped to 8 mm and transfacial suture with 0 stratafix used to primarily close defect under minimal tension. Progrip 12 cm  mesh was placed within the the pre peritoneal flap and self attached to the anterior abdominal wall centered over the defect.  The pre peritoneal flap was closed using  2-0 VLock.  Any peritoneal defect was closed with the 2-0 VLock.    Robot was undocked.  Abdomen then desufflated while camera within abdomen to ensure no signs of new bleed prior to removing camera and rest of ports completely.  All skin incisions closed with 4-0 Monocryl in a subcuticular fashion.  All wounds then dressed with Dermabond.  Patient was then successfully awakened and transferred to PACU in stable condition.  At the end of the procedure sponge and instrument counts were correct.

## 2020-05-15 NOTE — Discharge Instructions (Addendum)
RX FOR PAIN MED SENT TO [facility], [place],  PER DR. CONTRON-DIAZ.  (NO PRINTED PRESCRIPTION GIVEN TO PATIENT AT [facility], RATHER SENT VIA COMPUTER TO PT'S PHARMACY.  Diet: Resume home heart healthy regular diet.   Activity: No heavy lifting >20 pounds (children, pets, laundry, garbage) or strenuous activity until follow-up, but light activity and walking are encouraged. Do not drive or drink alcohol if taking narcotic pain medications.  Wound care: May shower with soapy water and pat dry (do not rub incisions), but no baths or submerging incision underwater until follow-up. (no swimming)   Medications: Resume all home medications. For mild to moderate pain: acetaminophen (Tylenol) or ibuprofen (if no kidney disease). Combining Tylenol with alcohol can substantially increase your risk of causing liver disease. Narcotic pain medications, if prescribed, can be used for severe pain, though may cause nausea, constipation, and drowsiness. Do not combine Tylenol and Norco within a 6 hour period as Norco contains Tylenol. If you do not need the narcotic pain medication, you do not need to fill the prescription.  Call office [phone]) at any time if any questions, worsening pain, fevers/chills, bleeding, drainage from incision site, or other concerns.   AMBULATORY SURGERY  DISCHARGE INSTRUCTIONS   1) The drugs that you were given will stay in your system until tomorrow so for the next 24 hours you should not:  A) Drive an automobile B) Make any legal decisions C) Drink any alcoholic beverage   2) You may resume regular meals tomorrow.  Today it is better to start with liquids and gradually work up to solid foods.  You may eat anything you prefer, but it is better to start with liquids, then soup and crackers, and gradually work up to solid foods.   3) Please notify your doctor immediately if you have any unusual bleeding, trouble breathing, redness and pain at the surgery  site, drainage, fever, or pain not relieved by medication.    4) Additional Instructions:        Please contact your physician with any problems or Same Day Surgery at [phone], Monday through Friday 6 am to 4 pm, or [place] at [facility] number at [phone].

## 2020-05-15 NOTE — Interval H&P Note (Signed)
History and Physical Interval Note:  05/15/2020 9:54 AM  [name]  has presented today for surgery, with the diagnosis of Y70.6 umbilical hernia w/o obstruction or gangrene.  The various methods of treatment have been discussed with the patient and family. After consideration of risks, benefits and other options for treatment, the patient has consented to  Procedure(s): XI [place] (N/A) as a surgical intervention.  The patient's history has been reviewed, patient examined, no change in status, stable for surgery.  I have reviewed the patient's chart and labs.  Questions were answered to the patient's satisfaction.     Herbert Pun

## 2020-05-15 NOTE — Transfer of Care (Signed)
Immediate Anesthesia Transfer of Care Note  Patient: [name]  Procedure(s) Performed: XI ROBOT ASSISTED UMBILICAL HERNIA REPAIR (N/A Abdomen)  Patient Location: PACU  Anesthesia Type:General  Level of Consciousness: awake, alert  and oriented  Airway & Oxygen Therapy: Patient Spontanous Breathing and Patient connected to face mask oxygen  Post-op Assessment: Report given to RN and Post -op Vital signs reviewed and stable  Post vital signs: Reviewed and stable  Last Vitals:  Vitals Value Taken Time  BP 141/90 05/15/20 1305  Temp 36.5 C 05/15/20 1305  Pulse 102 05/15/20 1308  Resp 11 05/15/20 1307  SpO2 96 % 05/15/20 1308  Vitals shown include unvalidated device data.  Last Pain:  Vitals:   05/15/20 0817  TempSrc: Temporal  PainSc: 0-No pain         Complications: No complications documented.

## 2020-05-15 NOTE — Anesthesia Procedure Notes (Signed)
Procedure Name: Intubation Date/Time: 05/15/2020 10:21 AM Performed by: Genevie Ann, CRNA Pre-anesthesia Checklist: Patient identified, Emergency Drugs available, Suction available, Patient being monitored and Timeout performed Patient Re-evaluated:Patient Re-evaluated prior to induction Oxygen Delivery Method: Circle system utilized Preoxygenation: Pre-oxygenation with 100% oxygen Induction Type: IV induction Ventilation: Mask ventilation without difficulty Laryngoscope Size: McGraph and 4 Grade View: Grade I Tube size: 7.0 mm Number of attempts: 1 Airway Equipment and Method: Stylet Placement Confirmation: ETT inserted through vocal cords under direct vision,  positive ETCO2 and breath sounds checked- equal and bilateral Secured at: 21 cm Dental Injury: Teeth and Oropharynx as per pre-operative assessment

## 2020-05-15 NOTE — Anesthesia Postprocedure Evaluation (Signed)
Anesthesia Post Note  Patient: [name]  Procedure(s) Performed: XI ROBOT ASSISTED UMBILICAL HERNIA REPAIR (N/A Abdomen)  Patient location during evaluation: PACU Anesthesia Type: General Level of consciousness: awake and alert Pain management: pain level controlled Vital Signs Assessment: post-procedure vital signs reviewed and stable Respiratory status: spontaneous breathing, nonlabored ventilation and respiratory function stable Cardiovascular status: blood pressure returned to baseline and stable Postop Assessment: no apparent nausea or vomiting Anesthetic complications: no   No complications documented.   Last Vitals:  Vitals:   05/15/20 1427 05/15/20 1442  BP: 124/79 130/70  Pulse: 95 98  Resp: 12 18  Temp: (!) 36.1 C (!) 36.1 C  SpO2: 94% 93%    Last Pain:  Vitals:   05/15/20 1442  TempSrc: Temporal  PainSc: [place]

## 2020-08-01 ENCOUNTER — Other Ambulatory Visit: Payer: Self-pay

## 2020-08-01 ENCOUNTER — Telehealth: Payer: Self-pay

## 2020-08-01 DIAGNOSIS — E291 Testicular hypofunction: Secondary | ICD-10-CM

## 2020-08-01 MED ORDER — TESTOSTERONE CYPIONATE 200 MG/ML IM SOLN: 120.0000 mg | INTRAMUSCULAR | 1 refills | Status: DC

## 2020-08-07 ENCOUNTER — Telehealth: Payer: Self-pay

## 2020-08-07 NOTE — Telephone Encounter (Signed)
Mail order pharmacy called and asked could pt have Testosterone in 90 supply. Talked with Dr. Alyson Ingles. He gave a verbal ok for it to be done.

## 2020-08-07 NOTE — Telephone Encounter (Signed)
Called pt and let him know that mail order pharmacy called and wanted his Testosterone rx  changed to a 90 day supply and Dr. Alyson Ingles oked it. Pt was very upset with pharmacy and apologized they called [place]. I told him we had no problem with any of them they had just called the one time. To call back if needed.

## 2020-08-09 ENCOUNTER — Encounter: Payer: Self-pay | Admitting: Urology

## 2020-08-09 ENCOUNTER — Other Ambulatory Visit: Payer: Self-pay

## 2020-08-09 ENCOUNTER — Ambulatory Visit (INDEPENDENT_AMBULATORY_CARE_PROVIDER_SITE_OTHER): Payer: BC Managed Care – PPO | Admitting: Urology

## 2020-08-09 ENCOUNTER — Ambulatory Visit: Payer: BC Managed Care – PPO | Admitting: Urology

## 2020-08-09 VITALS — BP 130/76 | HR 103 | Temp 98.2°F | Ht 75.0 in | Wt 228.0 lb

## 2020-08-09 DIAGNOSIS — N401 Enlarged prostate with lower urinary tract symptoms: Secondary | ICD-10-CM | POA: Diagnosis not present

## 2020-08-09 DIAGNOSIS — N5201 Erectile dysfunction due to arterial insufficiency: Secondary | ICD-10-CM

## 2020-08-09 DIAGNOSIS — E291 Testicular hypofunction: Secondary | ICD-10-CM | POA: Diagnosis not present

## 2020-08-09 LAB — URINALYSIS, ROUTINE W REFLEX MICROSCOPIC
Bilirubin, UA: NEGATIVE
Glucose, UA: NEGATIVE
Ketones, UA: NEGATIVE
Leukocytes,UA: NEGATIVE
Nitrite, UA: NEGATIVE
Protein,UA: NEGATIVE
RBC, UA: NEGATIVE
Specific Gravity, UA: 1.025 (ref 1.005–1.030)
Urobilinogen, Ur: 0.2 mg/dL (ref 0.2–1.0)
pH, UA: 7 (ref 5.0–7.5)

## 2020-08-09 MED ORDER — TAMSULOSIN HCL 0.4 MG PO CAPS
0.4000 mg | ORAL_CAPSULE | Freq: Every day | ORAL | 3 refills | Status: DC
Start: 2020-08-09 — End: 2021-02-07

## 2020-08-09 MED ORDER — TADALAFIL 20 MG PO TABS: 20.0000 mg | ORAL_TABLET | Freq: Every day | ORAL | 6 refills | Status: DC | PRN

## 2020-08-09 NOTE — Patient Instructions (Signed)
Testosterone injection What is this medicine? TESTOSTERONE (tes TOS ter one) is the main male hormone. It supports normal male development such as muscle growth, facial hair, and deep voice. It is used in males to treat low testosterone levels. This medicine may be used for other purposes; ask your health care provider or pharmacist if you have questions. COMMON BRAND NAME(S): Andro-L.A., Aveed, Delatestryl, Depo-Testosterone, Virilon What should I tell my health care provider before I take this medicine? They need to know if you have any of these conditions:  cancer  diabetes  heart disease  kidney disease  liver disease  lung disease  prostate disease  an unusual or allergic reaction to testosterone, other medicines, foods, dyes, or preservatives  pregnant or trying to get pregnant  breast-feeding How should I use this medicine? This medicine is for injection into a muscle. It is usually given by a health care professional in a hospital or clinic setting. Contact your pediatrician regarding the use of this medicine in children. While this medicine may be prescribed for children as young as 59 years of age for selected conditions, precautions do apply. Overdosage: If you think you have taken too much of this medicine contact a poison control center or emergency room at once. NOTE: This medicine is only for you. Do not share this medicine with others. What if I miss a dose? Try not to miss a dose. Your doctor or health care professional will tell you when your next injection is due. Notify the office if you are unable to keep an appointment. What may interact with this medicine?  medicines for diabetes  medicines that treat or prevent blood clots like warfarin  oxyphenbutazone  propranolol  steroid medicines like prednisone or cortisone This list may not describe all possible interactions. Give your health care provider a list of all the medicines, herbs, non-prescription  drugs, or dietary supplements you use. Also tell them if you smoke, drink alcohol, or use illegal drugs. Some items may interact with your medicine. What should I watch for while using this medicine? Visit your doctor or health care professional for regular checks on your progress. They will need to check the level of testosterone in your blood. This medicine is only approved for use in men who have low levels of testosterone related to certain medical conditions. Heart attacks and strokes have been reported with the use of this medicine. Notify your doctor or health care professional and seek emergency treatment if you develop breathing problems; changes in vision; confusion; chest pain or chest tightness; sudden arm pain; severe, sudden headache; trouble speaking or understanding; sudden numbness or weakness of the face, arm or leg; loss of balance or coordination. Talk to your doctor about the risks and benefits of this medicine. This medicine may affect blood sugar levels. If you have diabetes, check with your doctor or health care professional before you change your diet or the dose of your diabetic medicine. Testosterone injections are not commonly used in women. Women should inform their doctor if they wish to become pregnant or think they might be pregnant. There is a potential for serious side effects to an unborn child. Talk to your health care professional or pharmacist for more information. Talk with your doctor or health care professional about your birth control options while taking this medicine. This drug is banned from use in athletes by most athletic organizations. What side effects may I notice from receiving this medicine? Side effects that you should report to  your doctor or health care professional as soon as possible:  allergic reactions like skin rash, itching or hives, swelling of the face, lips, or tongue  breast enlargement  breathing problems  changes in emotions or  moods  deep or hoarse voice  irregular menstrual periods  signs and symptoms of liver injury like dark yellow or brown urine; general ill feeling or flu-like symptoms; light-colored stools; loss of appetite; nausea; right upper belly pain; unusually weak or tired; yellowing of the eyes or skin  stomach pain  swelling of the ankles, feet, hands  too frequent or persistent erections  trouble passing urine or change in the amount of urine Side effects that usually do not require medical attention (report to your doctor or health care professional if they continue or are bothersome):  acne  change in sex drive or performance  facial hair growth  hair loss  headache This list may not describe all possible side effects. Call your doctor for medical advice about side effects. You may report side effects to FDA at 1-800-FDA-1088. Where should I keep my medicine? Keep out of the reach of children. This medicine can be abused. Keep your medicine in a safe place to protect it from theft. Do not share this medicine with anyone. Selling or giving away this medicine is dangerous and against the law. Store at room temperature between 20 and 25 degrees C (68 and 77 degrees F). Do not freeze. Protect from light. Follow the directions for the product you are prescribed. Throw away any unused medicine after the expiration date. NOTE: This sheet is a summary. It may not cover all possible information. If you have questions about this medicine, talk to your doctor, pharmacist, or health care provider.  2020 Elsevier/Gold Standard (2015-09-14 07:33:55)

## 2020-08-09 NOTE — Progress Notes (Signed)
Urological Symptom Review  Patient is experiencing the following symptoms: Get up at night to urinate Leakage of urine   Review of Systems  Gastrointestinal (upper)  : Negative for upper GI symptoms  Gastrointestinal (lower) : Negative for lower GI symptoms  Constitutional : Negative for symptoms  Skin: Negative for skin symptoms  Eyes: Negative for eye symptoms  Ear/Nose/Throat : Sinus problems  Hematologic/Lymphatic: Negative for Hematologic/Lymphatic symptoms  Cardiovascular : Negative for cardiovascular symptoms  Respiratory : Negative for respiratory symptoms  Endocrine: Negative for endocrine symptoms  Musculoskeletal: Negative for musculoskeletal symptoms  Neurological: Negative for neurological symptoms  Psychologic: Negative for psychiatric symptoms

## 2020-08-12 LAB — COMPREHENSIVE METABOLIC PANEL
ALT: 31 IU/L (ref 0–44)
AST: 20 IU/L (ref 0–40)
Albumin/Globulin Ratio: 2.2 (ref 1.2–2.2)
Albumin: 4.8 g/dL (ref 3.8–4.9)
Alkaline Phosphatase: 83 IU/L (ref 44–121)
BUN/Creatinine Ratio: 12 (ref 9–20)
BUN: 12 mg/dL (ref 6–24)
Bilirubin Total: 0.4 mg/dL (ref 0.0–1.2)
CO2: 24 mmol/L (ref 20–29)
Calcium: 9.4 mg/dL (ref 8.7–10.2)
Chloride: 98 mmol/L (ref 96–106)
Creatinine, Ser: 1 mg/dL (ref 0.76–1.27)
GFR calc Af Amer: 96 mL/min/{1.73_m2} (ref >59)
GFR calc non Af Amer: 83 mL/min/{1.73_m2} (ref >59)
Globulin, Total: 2.2 g/dL (ref 1.5–4.5)
Glucose: 163 mg/dL — ABNORMAL HIGH (ref 65–99)
Potassium: 4.5 mmol/L (ref 3.5–5.2)
Sodium: 140 mmol/L (ref 134–144)
Total Protein: 7 g/dL (ref 6.0–8.5)

## 2020-08-12 LAB — CBC WITH DIFFERENTIAL
Basophils Absolute: 0 10*3/uL (ref 0.0–0.2)
Basos: 1 %
EOS (ABSOLUTE): 0.1 10*3/uL (ref 0.0–0.4)
Eos: 1 %
Hematocrit: 52 % — ABNORMAL HIGH (ref 37.5–51.0)
Hemoglobin: 18.2 g/dL — ABNORMAL HIGH (ref 13.0–17.7)
Immature Grans (Abs): 0 10*3/uL (ref 0.0–0.1)
Immature Granulocytes: 0 %
Lymphocytes Absolute: 1.2 10*3/uL (ref 0.7–3.1)
Lymphs: 21 %
MCH: 31.1 pg (ref 26.6–33.0)
MCHC: 35 g/dL (ref 31.5–35.7)
MCV: 89 fL (ref 79–97)
Monocytes Absolute: 0.3 10*3/uL (ref 0.1–0.9)
Monocytes: 6 %
Neutrophils Absolute: 4.2 10*3/uL (ref 1.4–7.0)
Neutrophils: 71 %
RBC: 5.86 x10E6/uL — ABNORMAL HIGH (ref 4.14–5.80)
RDW: 12.6 % (ref 11.6–15.4)
WBC: 5.8 10*3/uL (ref 3.4–10.8)

## 2020-08-12 LAB — TESTOSTERONE,FREE AND TOTAL
Testosterone, Free: 9.7 pg/mL (ref 7.2–24.0)
Testosterone: 511 ng/dL (ref 264–916)

## 2020-08-19 ENCOUNTER — Ambulatory Visit: Payer: BC Managed Care – PPO | Admitting: Urology

## 2020-08-20 ENCOUNTER — Telehealth: Payer: Self-pay

## 2020-08-20 NOTE — Progress Notes (Signed)
08/09/2020 8:43 AM   [name] 1961/02/22 [id number]  Referring provider: Dion Body, MD [facility] [place],  [place] [address]  followup hypogonadism  HPI: Mr Filak is a 59yo here for followup for hypogonadism, BPH and ED. No recent testosterone labs. Energy great. Libido good. No recent weight gain or loss. He has mild LUTS on flomax 0.4mg  daily. Nocturia 1x. Stream strong, no hesitancy, urinary frequency or urgency. Tadalafil works well for his ED. No new complaints today   PMH: Past Medical History:  Diagnosis Date  . BPH (benign prostatic hyperplasia)   . GERD (gastroesophageal reflux disease)    no meds  . History of hiatal hernia   . Hypertension   . Low testosterone   . Polycythemia   . Sleep apnea    uses a cpap nightly    Surgical History: Past Surgical History:  Procedure Laterality Date  . COLONOSCOPY    . I & D EXTREMITY Left 06/20/2013   Procedure: MINOR IRRIGATION AND DEBRIDEMENT LEFT INDEX FINGER;  Surgeon: Tennis Must, MD;  Location: [place];  Service: Orthopedics;  Laterality: Left;  . NASAL SINUS SURGERY    . UPPER GASTROINTESTINAL ENDOSCOPY    . WISDOM TOOTH EXTRACTION      Home Medications:  Allergies as of 08/09/2020      Reactions   Ciprofloxacin Itching   Contrast Media [iodinated Diagnostic Agents] Itching   Gets hot   Erythromycin    Stomach pain   Ioxaglate Itching   Metrizamide Itching   Codeine Itching   Hydrocodone-acetaminophen Itching   Metformin And Related Diarrhea   The plain metformin caused severe diarrhea but the ER version doesnt      Medication List       Accurate as of August 09, 2020 11:59 PM. If you have any questions, ask your nurse or doctor.        acetaminophen 500 MG tablet Commonly known as: TYLENOL Take 1,000 mg by mouth every 8 (eight) hours as needed for moderate pain or headache.   albuterol 108 (90 Base) MCG/ACT inhaler Commonly  known as: VENTOLIN HFA Inhale 2 puffs into the lungs every 4 (four) hours as needed for wheezing or shortness of breath.   aspirin 81 MG EC tablet Take 162 mg by mouth daily.   azelastine 0.1 % nasal spray Commonly known as: ASTELIN Place 1 spray into both nostrils 2 (two) times daily as needed for allergies.   benzonatate 200 MG capsule Commonly known as: TESSALON Take 200 mg by mouth 3 (three) times daily as needed for cough.   Calcium 250 MG Caps Take 250 mg by mouth daily.   cetirizine 10 MG tablet Commonly known as: ZYRTEC Take 10 mg by mouth daily as needed for allergies.   CoQ10 200 MG Caps Take 200 mg by mouth daily.   Dialyvite Vitamin D 5000 125 MCG (5000 UT) capsule Generic drug: Cholecalciferol Take 5,000 Units by mouth daily.   fluticasone 50 MCG/ACT nasal spray Commonly known as: FLONASE Place 1 spray into the nose daily as needed for allergies.   guaiFENesin 600 MG 12 hr tablet Commonly known as: MUCINEX Take 600 mg by mouth every 12 (twelve) hours as needed (congestion).   ibuprofen 200 MG tablet Commonly known as: ADVIL Take 400 mg by mouth every 6 (six) hours as needed for headache or moderate pain.   lisinopril 20 MG tablet Commonly known as: ZESTRIL Take 20 mg by  mouth daily.   lovastatin 20 MG tablet Commonly known as: MEVACOR Take 20 mg by mouth at bedtime.   magnesium oxide 400 MG tablet Commonly known as: MAG-OX Take 400 mg by mouth every Monday, Wednesday, and Friday.   metFORMIN 500 MG 24 hr tablet Commonly known as: GLUCOPHAGE-XR Take 500 mg by mouth 2 (two) times daily.   Mobic 15 MG tablet Generic drug: meloxicam Take 15 mg by mouth daily.   montelukast 10 MG tablet Commonly known as: SINGULAIR Take 10 mg by mouth at bedtime.   multivitamin tablet Take 1 tablet by mouth daily.   Potassium 99 MG Tabs Take 99 mg by mouth daily.   PROBIOTIC ADVANCED PO Take 1 tablet by mouth daily.   pseudoephedrine 30 MG  tablet Commonly known as: SUDAFED Take 30 mg by mouth every 4 (four) hours as needed for congestion.   tadalafil 20 MG tablet Commonly known as: CIALIS Take 1 tablet (20 mg total) by mouth daily as needed for erectile dysfunction.   tamsulosin 0.4 MG Caps capsule Commonly known as: FLOMAX Take 1 capsule (0.4 mg total) by mouth daily after supper. What changed: when to take this Changed by: Nicolette Bang, MD   testosterone cypionate 200 MG/ML injection Commonly known as: DEPOTESTOSTERONE CYPIONATE Inject 0.6 mLs (120 mg total) into the muscle every 7 (seven) days.   traMADol 50 MG tablet Commonly known as: Ultram Take 1 tablet (50 mg total) by mouth every 6 (six) hours as needed.   vitamin B-12 1000 MCG tablet Commonly known as: CYANOCOBALAMIN Take 1,000 mcg by mouth daily.       Allergies:  Allergies  Allergen Reactions  . Ciprofloxacin Itching  . Contrast Media [Iodinated Diagnostic Agents] Itching    Gets hot  . Erythromycin     Stomach pain  . Ioxaglate Itching  . Metrizamide Itching  . Codeine Itching  . Hydrocodone-Acetaminophen Itching  . Metformin And Related Diarrhea    The plain metformin caused severe diarrhea but the ER version doesnt    Family History: Family History  Problem Relation Age of Onset  . Colon cancer Father     Social History:  reports that he has never smoked. He has never used smokeless tobacco. He reports current alcohol use. He reports that he does not use drugs.  ROS: All other review of systems were reviewed and are negative except what is noted above in HPI  Physical Exam: BP 130/76   Pulse (!) 103   Temp 98.2 F (36.8 C)   Ht 6\' 3"  (1.905 m)   Wt 228 lb (103.4 kg)   BMI 28.50 kg/m   Constitutional:  Alert and oriented, No acute distress. HEENT: [place] AT, moist mucus membranes.  Trachea midline, no masses. Cardiovascular: No clubbing, cyanosis, or edema. Respiratory: Normal respiratory effort, no increased work of  breathing. GI: Abdomen is soft, nontender, nondistended, no abdominal masses GU: No CVA tenderness.  Lymph: No cervical or inguinal lymphadenopathy. Skin: No rashes, bruises or suspicious lesions. Neurologic: Grossly intact, no focal deficits, moving all 4 extremities. Psychiatric: Normal mood and affect.  Laboratory Data: Lab Results  Component Value Date   WBC 5.8 08/09/2020   HGB 18.2 (H) 08/09/2020   HCT 52.0 (H) 08/09/2020   MCV 89 08/09/2020   PLT 166 12/20/2017    Lab Results  Component Value Date   CREATININE 1.00 08/09/2020    No results found for: PSA  Lab Results  Component Value Date   TESTOSTERONE 511  08/09/2020    No results found for: HGBA1C  Urinalysis    Component Value Date/Time   COLORURINE ORANGE (A) 03/20/2019 1020   APPEARANCEUR Clear 08/09/2020 1546   LABSPEC  03/20/2019 1020    TEST NOT REPORTED DUE TO COLOR INTERFERENCE OF URINE PIGMENT   LABSPEC 1.015 11/03/2013 1218   PHURINE  03/20/2019 1020    TEST NOT REPORTED DUE TO COLOR INTERFERENCE OF URINE PIGMENT   GLUCOSEU Negative 08/09/2020 1546   GLUCOSEU NEGATIVE 11/03/2013 1218   HGBUR (A) 03/20/2019 1020    TEST NOT REPORTED DUE TO COLOR INTERFERENCE OF URINE PIGMENT   BILIRUBINUR Negative 08/09/2020 1546   BILIRUBINUR Negative 11/13/2011 1910   KETONESUR (A) 03/20/2019 1020    TEST NOT REPORTED DUE TO COLOR INTERFERENCE OF URINE PIGMENT   PROTEINUR Negative 08/09/2020 1546   PROTEINUR (A) 03/20/2019 1020    TEST NOT REPORTED DUE TO COLOR INTERFERENCE OF URINE PIGMENT   UROBILINOGEN 2.0 (H) 08/23/2014 2100   NITRITE Negative 08/09/2020 1546   NITRITE (A) 03/20/2019 1020    TEST NOT REPORTED DUE TO COLOR INTERFERENCE OF URINE PIGMENT   LEUKOCYTESUR Negative 08/09/2020 1546   LEUKOCYTESUR (A) 03/20/2019 1020    TEST NOT REPORTED DUE TO COLOR INTERFERENCE OF URINE PIGMENT   LEUKOCYTESUR Negative 11/13/2011 1910    Lab Results  Component Value Date   LABMICR Comment 08/09/2020    BACTERIA NONE SEEN 03/20/2019    Pertinent Imaging:  No results found for this or any previous visit.  No results found for this or any previous visit.  No results found for this or any previous visit.  No results found for this or any previous visit.  No results found for this or any previous visit.  No results found for this or any previous visit.  No results found for this or any previous visit.  No results found for this or any previous visit.   Assessment & Plan:    1. Benign localized prostatic hyperplasia with lower urinary tract symptoms (LUTS) -continue flomax 0.4mg  daily - Urinalysis, Routine w reflex microscopic  2. Male hypogonadism -testosterone labs today, will call with results. Continue IM testosterone 125mg  weekly - Testosterone,Free and Total; Future - CBC With differential/Platelet; Future - Testosterone,Free and Total - CBC With differential/Platelet - Comprehensive metabolic panel - CBC With differential/Platelet - Testosterone,Free and Total  3. Erectile dysfunction due to arterial insufficiency -continue tadalafil 20mg  prn   Return in about 6 months (around 02/07/2021) for testosterone labs.  , MD  [facility] [place]

## 2020-08-20 NOTE — Telephone Encounter (Signed)
Called pt to let him know Dr. Ronne Binning said his hemoglobin is high and he needs to donate blood to get it down. A message came on phone that message was being transcribed and to speak clearly. Gave message and said any questions to please call.

## 2020-08-20 NOTE — Telephone Encounter (Signed)
-----   Message from Malen Gauze, MD sent at 08/20/2020 11:14 AM EST ----- Hgb is high. He needs to donate blood ----- Message ----- From: Dalia Heading, LPN Sent: 46/19/0122   3:40 PM EST To: Malen Gauze, MD  Pls review.

## 2020-08-27 NOTE — Telephone Encounter (Signed)
Pt seen in office 08/09/20

## 2020-08-28 ENCOUNTER — Ambulatory Visit: Payer: BC Managed Care – PPO | Admitting: Urology

## 2020-09-10 ENCOUNTER — Emergency Department
Admission: EM | Admit: 2020-09-10 | Discharge: 2020-09-10 | Disposition: A | Discharge status: Home / Self Care | Payer: No Typology Code available for payment source | Attending: Student in an Organized Health Care Education/Training Program | Admitting: Student in an Organized Health Care Education/Training Program

## 2020-09-10 ENCOUNTER — Emergency Department: Payer: No Typology Code available for payment source

## 2020-09-10 ENCOUNTER — Other Ambulatory Visit: Payer: Self-pay

## 2020-09-10 DIAGNOSIS — Y990 Civilian activity done for income or pay: Secondary | ICD-10-CM | POA: Insufficient documentation

## 2020-09-10 DIAGNOSIS — Z7984 Long term (current) use of oral hypoglycemic drugs: Secondary | ICD-10-CM | POA: Diagnosis not present

## 2020-09-10 DIAGNOSIS — Z7982 Long term (current) use of aspirin: Secondary | ICD-10-CM | POA: Insufficient documentation

## 2020-09-10 DIAGNOSIS — S39012A Strain of muscle, fascia and tendon of lower back, initial encounter: Secondary | ICD-10-CM | POA: Insufficient documentation

## 2020-09-10 DIAGNOSIS — S060X9A Concussion with loss of consciousness of unspecified duration, initial encounter: Secondary | ICD-10-CM | POA: Diagnosis not present

## 2020-09-10 DIAGNOSIS — W009XXA Unspecified fall due to ice and snow, initial encounter: Secondary | ICD-10-CM | POA: Diagnosis not present

## 2020-09-10 DIAGNOSIS — E119 Type 2 diabetes mellitus without complications: Secondary | ICD-10-CM | POA: Insufficient documentation

## 2020-09-10 DIAGNOSIS — M542 Cervicalgia: Secondary | ICD-10-CM | POA: Diagnosis not present

## 2020-09-10 DIAGNOSIS — S0990XA Unspecified injury of head, initial encounter: Secondary | ICD-10-CM | POA: Diagnosis present

## 2020-09-10 DIAGNOSIS — I10 Essential (primary) hypertension: Secondary | ICD-10-CM | POA: Insufficient documentation

## 2020-09-10 DIAGNOSIS — Z79899 Other long term (current) drug therapy: Secondary | ICD-10-CM | POA: Insufficient documentation

## 2020-09-10 HISTORY — DX: Type 2 diabetes mellitus without complications: E11.9

## 2020-09-10 HISTORY — DX: Hyperlipidemia, unspecified: E78.5

## 2020-09-10 NOTE — ED Provider Notes (Signed)
[facility] Emergency Department Provider Note  ____________________________________________   Event Date/Time   First MD Initiated Contact with Patient 09/10/20 1102     (approximate)  I have reviewed the triage vital signs and the nursing notes.   HISTORY  Chief Complaint Fall and Head Injury   HPI [name] is a 60 y.o. male with a past medical history of BPH, DM, GERD, HTN, HDL, was ischemia and OSA on CPAP at night who presents for assessment of some headache and back pain after he states he slipped on some ice urine this morning falling hitting the back of his head.  He thinks he had some LOC.  He is not on any blood thinners.  He has little bit of a posterior headache still and some very mild lower back pain.  He has been able to ambulate with steady gait.  No other recent sick symptoms including fevers, chills, headache, earache, sore throat, vomiting, diarrhea, dysuria, rash abdominal pain, incontinence, urinary symptoms, diarrhea, or other recent falls or injuries.         Past Medical History:  Diagnosis Date  . BPH (benign prostatic hyperplasia)   . Diabetes mellitus without complication ([place])   . GERD (gastroesophageal reflux disease)    no meds  . History of hiatal hernia   . Hyperlipidemia   . Hypertension   . Low testosterone   . Polycythemia   . Sleep apnea    uses a cpap nightly    Patient Active Problem List   Diagnosis Date Noted  . Partial small bowel obstruction ([place]) 12/18/2017  . Essential hypertension 12/18/2017  . Low testosterone in male 12/18/2017    Past Surgical History:  Procedure Laterality Date  . COLONOSCOPY    . I & D EXTREMITY Left 06/20/2013   Procedure: MINOR IRRIGATION AND DEBRIDEMENT LEFT INDEX FINGER;  Surgeon: Tennis Must, MD;  Location: [place];  Service: Orthopedics;  Laterality: Left;  . NASAL SINUS SURGERY    . UPPER GASTROINTESTINAL ENDOSCOPY    . WISDOM TOOTH  EXTRACTION      Prior to Admission medications   Medication Sig Start Date End Date Taking? Authorizing Provider  acetaminophen (TYLENOL) 500 MG tablet Take 1,000 mg by mouth every 8 (eight) hours as needed for moderate pain or headache.    [provider]  albuterol (VENTOLIN HFA) 108 (90 Base) MCG/ACT inhaler Inhale 2 puffs into the lungs every 4 (four) hours as needed for wheezing or shortness of breath.  12/22/19 12/21/20  [provider]  aspirin 81 MG EC tablet Take 162 mg by mouth daily.     [provider]  azelastine (ASTELIN) 0.1 % nasal spray Place 1 spray into both nostrils 2 (two) times daily as needed for allergies.  12/25/19 12/24/20  [provider]  benzonatate (TESSALON) 200 MG capsule Take 200 mg by mouth 3 (three) times daily as needed for cough.    [provider]  Calcium 250 MG CAPS Take 250 mg by mouth daily.    [provider]  cetirizine (ZYRTEC) 10 MG tablet Take 10 mg by mouth daily as needed for allergies.     [provider]  Cholecalciferol (DIALYVITE VITAMIN D 5000) 125 MCG (5000 UT) capsule Take 5,000 Units by mouth daily.    [provider]  Coenzyme Q10 (COQ10) 200 MG CAPS Take 200 mg by mouth daily.    [provider]  fluticasone (FLONASE) 50 MCG/ACT  nasal spray Place 1 spray into the nose daily as needed for allergies.     [provider]  guaiFENesin (MUCINEX) 600 MG 12 hr tablet Take 600 mg by mouth every 12 (twelve) hours as needed (congestion).     [provider]  ibuprofen (ADVIL) 200 MG tablet Take 400 mg by mouth every 6 (six) hours as needed for headache or moderate pain.    [provider]  lisinopril (ZESTRIL) 20 MG tablet Take 20 mg by mouth daily.  06/26/19   [provider]  lovastatin (MEVACOR) 20 MG tablet Take 20 mg by mouth at bedtime.    [provider]  magnesium oxide (MAG-OX) 400 MG tablet Take 400 mg by mouth every  Monday, Wednesday, and Friday.    [provider]  metFORMIN (GLUCOPHAGE-XR) 500 MG 24 hr tablet Take 500 mg by mouth 2 (two) times daily.    [provider]  MOBIC 15 MG tablet Take 15 mg by mouth daily.  11/06/19   [provider]  montelukast (SINGULAIR) 10 MG tablet Take 10 mg by mouth at bedtime.  04/07/20   [provider]  Multiple Vitamin (MULTIVITAMIN) tablet Take 1 tablet by mouth daily.    [provider]  Potassium 99 MG TABS Take 99 mg by mouth daily.    [provider]  Probiotic Product (PROBIOTIC ADVANCED PO) Take 1 tablet by mouth daily.    [provider]  pseudoephedrine (SUDAFED) 30 MG tablet Take 30 mg by mouth every 4 (four) hours as needed for congestion.     [provider]  tadalafil (CIALIS) 20 MG tablet Take 1 tablet (20 mg total) by mouth daily as needed for erectile dysfunction. 08/09/20   McKenzie, Candee Furbish, MD  tamsulosin (FLOMAX) 0.4 MG CAPS capsule Take 1 capsule (0.4 mg total) by mouth daily after supper. 08/09/20   McKenzie, Candee Furbish, MD  testosterone cypionate (DEPOTESTOSTERONE CYPIONATE) 200 MG/ML injection Inject 0.6 mLs (120 mg total) into the muscle every 7 (seven) days. 08/01/20   McKenzie, Candee Furbish, MD  traMADol (ULTRAM) 50 MG tablet Take 1 tablet (50 mg total) by mouth every 6 (six) hours as needed. 05/15/20 05/15/21  Herbert Pun, MD  vitamin B-12 (CYANOCOBALAMIN) 1000 MCG tablet Take 1,000 mcg by mouth daily.    [provider]    Allergies Ciprofloxacin, Contrast media [iodinated diagnostic agents], Erythromycin, Ioxaglate, Metrizamide, Codeine, Hydrocodone-acetaminophen, and Metformin and related  Family History  Problem Relation Age of Onset  . Colon cancer Father     Social History Social History   Tobacco Use  . Smoking status: Never Smoker  . Smokeless tobacco: Never Used  Vaping Use  . Vaping Use: Never used  Substance Use Topics  . Alcohol use:  Yes    Comment: occ  . Drug use: No    Review of Systems  Review of Systems  Constitutional: Negative for chills and fever.  HENT: Negative for sore throat.   Eyes: Negative for pain.  Respiratory: Negative for cough and stridor.   Cardiovascular: Negative for chest pain.  Gastrointestinal: Negative for vomiting.  Genitourinary: Negative for dysuria.  Musculoskeletal: Positive for back pain.  Skin: Negative for rash.  Neurological: Positive for loss of consciousness and headaches. Negative for seizures.  Psychiatric/Behavioral: Negative for suicidal ideas.  All other systems reviewed and are negative.     ____________________________________________   PHYSICAL EXAM:  VITAL SIGNS: ED Triage Vitals  Enc Vitals Group  BP 09/10/20 0916 131/73     Pulse Rate 09/10/20 0916 100     Resp 09/10/20 0916 16     Temp 09/10/20 0916 98 F (36.7 C)     Temp Source 09/10/20 0916 Oral     SpO2 09/10/20 0916 97 %     Weight 09/10/20 0917 230 lb (104.3 kg)     Height 09/10/20 0917 6\' 3"  (1.905 m)     Head Circumference --      Peak Flow --      Pain Score 09/10/20 0917 6     Pain Loc --      Pain Edu? --      Excl. in [place]? --    Vitals:   09/10/20 0916  BP: 131/73  Pulse: 100  Resp: 16  Temp: 98 F (36.7 C)  SpO2: 97%   Physical Exam Vitals and nursing note reviewed.  Constitutional:      Appearance: Normal appearance. He is well-developed, normal weight and well-nourished.  HENT:     Head: Normocephalic and atraumatic.     Right Ear: External ear normal.     Left Ear: External ear normal.     Nose: Nose normal.     Mouth/Throat:     Mouth: Mucous membranes are moist.  Eyes:     Conjunctiva/sclera: Conjunctivae normal.  Cardiovascular:     Rate and Rhythm: Normal rate and regular rhythm.     Pulses: Normal pulses.     Heart sounds: No murmur heard.   Pulmonary:     Effort: Pulmonary effort is normal. No respiratory distress.     Breath sounds: Normal breath  sounds.  Abdominal:     Palpations: Abdomen is soft.     Tenderness: There is no abdominal tenderness.  Musculoskeletal:        General: No edema.     Cervical back: Neck supple.  Skin:    General: Skin is warm and dry.     Capillary Refill: Capillary refill takes less than 2 seconds.  Neurological:     Mental Status: He is alert and oriented to person, place, and time.  Psychiatric:        Mood and Affect: Mood and affect and mood normal.     Cranial nerves II through XII grossly intact.  Patient has full symmetric strength of all extremities.  Sensation is intact to light touch to all extremities.  There is some mild tenderness over the bilateral thoracic and lumbar muscles without any step-offs or deformities over the C/T/L-spine.  Slight bilateral trapezius tenderness but no point tenderness over the T/T/L-spine spinous processes. ____________________________________________   LABS (all labs ordered are listed, but only abnormal results are displayed)  Labs Reviewed - No data to display ____________________________________________   ____________________________________________  RADIOLOGY  ED MD interpretation: CT head and C-spine showed no fracture or dislocation.  No evidence of intracranial hemorrhage.  Chest x-ray is unremarkable for pneumothorax or rib fractures or other intrathoracic process  Official radiology report(s): DG Chest 2 View  Result Date: 09/10/2020 CLINICAL DATA:  Fall.  Chest pain. EXAM: CHEST - 2 VIEW COMPARISON:  01/24/2018.  09/22/2018 from [place]: Mild right hemidiaphragm elevation. Midline trachea. Normal heart size and mediastinal contours. No pleural effusion or pneumothorax. Nonspecific mild pulmonary interstitial thickening. Clear lungs. IMPRESSION: No acute cardiopulmonary disease. Electronically Signed   By: Abigail Miyamoto M.D.   On: 09/10/2020 09:54   CT Head Wo Contrast  Result Date: 09/10/2020 CLINICAL  DATA:  Fall, head  injury EXAM: CT HEAD WITHOUT CONTRAST CT CERVICAL SPINE WITHOUT CONTRAST TECHNIQUE: Multidetector CT imaging of the head and cervical spine was performed following the standard protocol without intravenous contrast. Multiplanar CT image reconstructions of the cervical spine were also generated. COMPARISON:  None. FINDINGS: CT HEAD FINDINGS Brain: No evidence of acute infarction, hemorrhage, hydrocephalus, extra-axial collection or mass lesion/mass effect. Vascular: Negative for hyperdense vessel Skull: Negative Sinuses/Orbits: Paranasal sinuses clear.  Negative orbit. Other: None CT CERVICAL SPINE FINDINGS Alignment: Normal Skull base and vertebrae: Negative for fracture Soft tissues and spinal canal: Negative for mass or edema. Disc levels: Mild disc degeneration in the cervical spine. Spinal stenosis and foraminal stenosis bilaterally at C3-4 due to spurring. 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 Upper chest: Negative Other: None IMPRESSION: Negative CT head Negative for cervical spine fracture.  Mild degenerative change. Electronically Signed   By: Franchot Gallo M.D.   On: 09/10/2020 09:47   CT Cervical Spine Wo Contrast  Result Date: 09/10/2020 CLINICAL DATA:  Fall, head injury EXAM: CT HEAD WITHOUT CONTRAST CT CERVICAL SPINE WITHOUT CONTRAST TECHNIQUE: Multidetector CT imaging of the head and cervical spine was performed following the standard protocol without intravenous contrast. Multiplanar CT image reconstructions of the cervical spine were also generated. COMPARISON:  None. FINDINGS: CT HEAD FINDINGS Brain: No evidence of acute infarction, hemorrhage, hydrocephalus, extra-axial collection or mass lesion/mass effect. Vascular: Negative for hyperdense vessel Skull: Negative Sinuses/Orbits: Paranasal sinuses clear.  Negative orbit. Other: None CT CERVICAL SPINE FINDINGS Alignment: Normal Skull base and vertebrae: Negative for fracture Soft tissues and spinal canal: Negative  for mass or edema. Disc levels: Mild disc degeneration in the cervical spine. Spinal stenosis and foraminal stenosis bilaterally at C3-4 due to spurring. 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 Upper chest: Negative Other: None IMPRESSION: Negative CT head Negative for cervical spine fracture.  Mild degenerative change. Electronically Signed   By: Franchot Gallo M.D.   On: 09/10/2020 09:47    ____________________________________________   PROCEDURES  Procedure(s) performed (including Critical Care):  Procedures   ____________________________________________   INITIAL IMPRESSION / ASSESSMENT AND PLAN / ED COURSE      Patient presents above history exam for assessment after probable mechanical fall as described above.  Patient is afebrile hemodynamically stable arrival.  He is neurovascular intact in all extremities.  No evidence of significant trauma to the extremities chest abdomen back head or neck.  CT imaging and chest x-ray are unremarkable.  No other acute concerns per patient at this time.  Suspect concussion with possible musculoskeletal strain in the back.  Advised expected clinical course and use of appropriate OTC analgesia.  Discharged stable condition.  Strict return precautions advised and discussed.       ____________________________________________   FINAL CLINICAL IMPRESSION(S) / ED DIAGNOSES  Final diagnoses:  Concussion with loss of consciousness, initial encounter  Back strain, initial encounter    Medications - No data to display   ED Discharge Orders    None       Note:  This document was prepared using Dragon voice recognition software and may include unintentional dictation errors.   Lucrezia Starch, MD 09/10/20 [phone]

## 2020-09-10 NOTE — ED Notes (Signed)
PT discharged from ED, discharge instructions reviewed, pt states understanding and denies any questions

## 2020-09-10 NOTE — ED Triage Notes (Signed)
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 Pt states he fell back hitting his head knocking him out for a minute, c/o upper back and neck pain with HA, states he had some dizziness for the first 15 minutes but denies now.the patient is ambulatory to triage with a steady gait

## 2021-01-13 ENCOUNTER — Other Ambulatory Visit: Payer: Self-pay

## 2021-01-13 ENCOUNTER — Ambulatory Visit
Admission: RE | Admit: 2021-01-13 | Discharge: 2021-01-13 | Disposition: A | Discharge status: Home / Self Care | Payer: BC Managed Care – PPO | Source: Ambulatory Visit | Attending: Family Medicine | Admitting: Family Medicine

## 2021-01-13 ENCOUNTER — Other Ambulatory Visit: Payer: Self-pay | Admitting: Family Medicine

## 2021-01-13 DIAGNOSIS — R109 Unspecified abdominal pain: Secondary | ICD-10-CM

## 2021-01-31 ENCOUNTER — Other Ambulatory Visit: Payer: BC Managed Care – PPO

## 2021-02-03 ENCOUNTER — Other Ambulatory Visit: Payer: BC Managed Care – PPO

## 2021-02-03 ENCOUNTER — Other Ambulatory Visit: Payer: Self-pay

## 2021-02-05 LAB — TESTOSTERONE,FREE AND TOTAL
Testosterone, Free: 6.8 pg/mL — ABNORMAL LOW (ref 7.2–24.0)
Testosterone: 391 ng/dL (ref 264–916)

## 2021-02-06 ENCOUNTER — Other Ambulatory Visit: Payer: Self-pay | Admitting: Urology

## 2021-02-07 ENCOUNTER — Encounter: Payer: Self-pay | Admitting: Urology

## 2021-02-07 ENCOUNTER — Telehealth (INDEPENDENT_AMBULATORY_CARE_PROVIDER_SITE_OTHER): Payer: BC Managed Care – PPO | Admitting: Urology

## 2021-02-07 ENCOUNTER — Other Ambulatory Visit: Payer: Self-pay

## 2021-02-07 DIAGNOSIS — N5201 Erectile dysfunction due to arterial insufficiency: Secondary | ICD-10-CM

## 2021-02-07 DIAGNOSIS — E291 Testicular hypofunction: Secondary | ICD-10-CM | POA: Diagnosis not present

## 2021-02-07 DIAGNOSIS — N401 Enlarged prostate with lower urinary tract symptoms: Secondary | ICD-10-CM

## 2021-02-07 DIAGNOSIS — R351 Nocturia: Secondary | ICD-10-CM | POA: Diagnosis not present

## 2021-02-07 MED ORDER — TADALAFIL 20 MG PO TABS: 20.0000 mg | ORAL_TABLET | Freq: Every day | ORAL | 6 refills | Status: DC | PRN

## 2021-02-07 MED ORDER — TAMSULOSIN HCL 0.4 MG PO CAPS: 0.4000 mg | ORAL_CAPSULE | Freq: Every day | ORAL | 3 refills | Status: DC

## 2021-02-07 MED ORDER — TESTOSTERONE CYPIONATE 200 MG/ML IM SOLN: 120.0000 mg | INTRAMUSCULAR | 2 refills | Status: DC

## 2021-02-07 MED ORDER — TESTOSTERONE CYPIONATE 200 MG/ML IM SOLN
120.0000 mg | INTRAMUSCULAR | 2 refills | Status: DC
Start: 2021-02-07 — End: 2021-02-07

## 2021-02-07 NOTE — Patient Instructions (Signed)
Testosterone Injection What is this medication? TESTOSTERONE (tes TOS ter one) is used to increase testosterone levels in yourbody. It belongs to a group of medications called androgen hormones. This medicine may be used for other purposes; ask your health care provider orpharmacist if you have questions. COMMON BRAND NAME(S): Andro-L.A., Aveed, Delatestryl, Depo-Testosterone, Virilon What should I tell my care team before I take this medication? They need to know if you have any of these conditions: Cancer Diabetes Heart disease Kidney disease Liver disease Lung disease Prostate disease An unusual or allergic reaction to testosterone, other medications, foods, dyes, or preservatives If a male partner is pregnant or trying to get pregnant Breast-feeding How should I use this medication? This medication is for injection into a muscle. It is usually given in ahospital or clinic. A special MedGuide will be given to you by the pharmacist with eachprescription and refill. Be sure to read this information carefully each time. Contact your care team regarding the use of this medication in children. While this medication may be prescribed for children as young as 39 years of age forselected conditions, precautions do apply. Overdosage: If you think you have taken too much of this medicine contact apoison control center or emergency room at once. NOTE: This medicine is only for you. Do not share this medicine with others. What if I miss a dose? Try not to miss a dose. Your care team will tell you when your next injectionis due. Notify the office if you are unable to keep an appointment. What may interact with this medication? Medications for diabetes Medications that treat or prevent blood clots like warfarin Oxyphenbutazone Propranolol Steroid medications like prednisone or cortisone This list may not describe all possible interactions. Give your health care provider a list of all the  medicines, herbs, non-prescription drugs, or dietary supplements you use. Also tell them if you smoke, drink alcohol, or use illegaldrugs. Some items may interact with your medicine. What should I watch for while using this medication? Visit your care team for regular checks on your progress. They will need tocheck the level of testosterone in your blood. This medication is only approved for use in men who have low levels of testosterone related to certain medical conditions. Heart attacks and strokes have been reported with the use of this medication. Notify your care team and seek emergency treatment if you develop breathing problems; changes in vision; confusion; chest pain or chest tightness; sudden arm pain; severe, sudden headache; trouble speaking or understanding; sudden numbness or weakness of the face, arm or leg; loss of balance or coordination. Talk to your care team aboutthe risks and benefits of this medication. This medication may affect blood sugar levels. If you have diabetes, check with your care team before you change your diet or the dose of your diabeticmedication. Testosterone injections are not commonly used in women. Women should inform their care team if they wish to become pregnant or think they might be pregnant. There is a potential for serious side effects to an unborn child. Talk to your care team or pharmacist for more information. Talk with your careteam about your birth control options while taking this medication. This medication is banned from use in athletes by most athletic organizations. What side effects may I notice from receiving this medication? Side effects that you should report to your care team as soon as possible: Allergic reactions-skin rash, itching, hives, swelling of the face, lips, tongue, or throat Blood clot-pain, swelling, or warmth in the  leg, shortness of breath, chest pain Heart attack-pain or tightness in the chest, shoulders, arms or jaw, nausea,  shortness of breath, cold or clammy skin, feeling faint or lightheaded Increase in blood pressure Liver injury-right upper belly pain, loss of appetite, nausea, light-colored stool, dark yellow or brown urine, yellowing of the skin or eyes, unusual weakness or fatigue Mood swings, irritability, or hostility Prolonged or painful erection Sleep apnea-loud snoring, gasping during sleep, daytime sleepiness Stroke-sudden numbness or weakness of the face, arm or leg, trouble speaking, confusion, trouble walking, loss of balance or coordination, dizziness, severe headache, change in vision Swelling of the ankles, hands, or feet Thoughts of suicide or self-harm, worsening mood, feelings of depression Side effects that usually do not require medical attention (report to your careteam if they continue or are bothersome): Acne Change in sex drive or performance Pain, redness, or irritation at the application site Unexpected breast tissue growth This list may not describe all possible side effects. Call your doctor for medical advice about side effects. You may report side effects to FDA at1-800-FDA-1088. Where should I keep my medication? Keep out of the reach of children. This medication can be abused. Keep your medication in a safe place to protect it from theft. Do not share this medication with anyone. Selling or giving away this medication is dangerous andagainst the law. Store at room temperature between 20 and 25 degrees C (68 and 77 degrees F). Do not freeze. Protect from light. Follow the directions for the product you areprescribed. Throw away any unused medication after the expiration date. NOTE: This sheet is a summary. It may not cover all possible information. If you have questions about this medicine, talk to your doctor, pharmacist, orhealth care provider.  2022 Elsevier/Gold Standard (2020-08-07 12:47:57)

## 2021-02-07 NOTE — Progress Notes (Signed)
02/07/2021 1:17 PM   [name] Mar 01, 1961 [id number]  Referring provider: Dion Body, MD [facility] [place],  [place] [address]  Patient location: home Physician location: office I connected with  [name] on 02/07/21 by a video enabled telemedicine application and verified that I am speaking with the correct person using two identifiers.   I discussed the limitations of evaluation and management by telemedicine. The patient expressed understanding and agreed to proceed.     HPI: [name] is a 60yo here for followup for hypogonadism, erectile dysfunction, and BPH. Testosterone 391, hgb 17.2. CMP. He injects 120mg  IM testosterone weekly. Energy fair. He is taking a B12 supplimentGood libido. He takes tadalafil 20mg  prn with good results. He has stable mild LUTS on flomax 0.4mg  daily.    PMH: Past Medical History:  Diagnosis Date   BPH (benign prostatic hyperplasia)    Diabetes mellitus without complication (HCC)    GERD (gastroesophageal reflux disease)    no meds   History of hiatal hernia    Hyperlipidemia    Hypertension    Low testosterone    Polycythemia    Sleep apnea    uses a cpap nightly    Surgical History: Past Surgical History:  Procedure Laterality Date   COLONOSCOPY     I & D EXTREMITY Left 06/20/2013   Procedure: MINOR IRRIGATION AND DEBRIDEMENT LEFT INDEX FINGER;  Surgeon: Tennis Must, MD;  Location: [facility];  Service: Orthopedics;  Laterality: Left;   NASAL SINUS SURGERY     UPPER GASTROINTESTINAL ENDOSCOPY     WISDOM TOOTH EXTRACTION      Home Medications:  Allergies as of 02/07/2021       Reactions   Ciprofloxacin Itching   Contrast Media [iodinated Diagnostic Agents] Itching   Gets hot   Erythromycin    Stomach pain   Ioxaglate Itching   Metrizamide Itching   Codeine Itching   Hydrocodone-acetaminophen Itching   Metformin And Related Diarrhea   The plain metformin  caused severe diarrhea but the ER version doesnt        Medication List        Accurate as of February 07, 2021  1:17 PM. If you have any questions, ask your nurse or doctor.          acetaminophen 500 MG tablet Commonly known as: TYLENOL Take 1,000 mg by mouth every 8 (eight) hours as needed for moderate pain or headache.   albuterol 108 (90 Base) MCG/ACT inhaler Commonly known as: VENTOLIN HFA Inhale 2 puffs into the lungs every 4 (four) hours as needed for wheezing or shortness of breath.   aspirin 81 MG EC tablet Take 162 mg by mouth daily.   azelastine 0.1 % nasal spray Commonly known as: ASTELIN Place 1 spray into both nostrils 2 (two) times daily as needed for allergies.   benzonatate 200 MG capsule Commonly known as: TESSALON Take 200 mg by mouth 3 (three) times daily as needed for cough.   Calcium 250 MG Caps Take 250 mg by mouth daily.   cetirizine 10 MG tablet Commonly known as: ZYRTEC Take 10 mg by mouth daily as needed for allergies.   CoQ10 200 MG Caps Take 200 mg by mouth daily.   Dialyvite Vitamin D 5000 125 MCG (5000 UT) capsule Generic drug: Cholecalciferol Take 5,000 Units by mouth daily.   fluticasone 50 MCG/ACT nasal spray Commonly known as: FLONASE Place 1 spray into  the nose daily as needed for allergies.   guaiFENesin 600 MG 12 hr tablet Commonly known as: MUCINEX Take 600 mg by mouth every 12 (twelve) hours as needed (congestion).   ibuprofen 200 MG tablet Commonly known as: ADVIL Take 400 mg by mouth every 6 (six) hours as needed for headache or moderate pain.   lisinopril 20 MG tablet Commonly known as: ZESTRIL Take 20 mg by mouth daily.   lovastatin 20 MG tablet Commonly known as: MEVACOR Take 20 mg by mouth at bedtime.   magnesium oxide 400 MG tablet Commonly known as: MAG-OX Take 400 mg by mouth every Monday, Wednesday, and Friday.   metFORMIN 500 MG 24 hr tablet Commonly known as: GLUCOPHAGE-XR Take 500 mg by mouth 2  (two) times daily.   Mobic 15 MG tablet Generic drug: meloxicam Take 15 mg by mouth daily.   montelukast 10 MG tablet Commonly known as: SINGULAIR Take 10 mg by mouth at bedtime.   multivitamin tablet Take 1 tablet by mouth daily.   Potassium 99 MG Tabs Take 99 mg by mouth daily.   PROBIOTIC ADVANCED PO Take 1 tablet by mouth daily.   pseudoephedrine 30 MG tablet Commonly known as: SUDAFED Take 30 mg by mouth every 4 (four) hours as needed for congestion.   tadalafil 20 MG tablet Commonly known as: CIALIS Take 1 tablet (20 mg total) by mouth daily as needed for erectile dysfunction.   tamsulosin 0.4 MG Caps capsule Commonly known as: FLOMAX Take 1 capsule (0.4 mg total) by mouth daily after supper.   testosterone cypionate 200 MG/ML injection Commonly known as: DEPOTESTOSTERONE CYPIONATE Inject 0.6 mLs (120 mg total) into the muscle every 7 (seven) days.   traMADol 50 MG tablet Commonly known as: Ultram Take 1 tablet (50 mg total) by mouth every 6 (six) hours as needed.   vitamin B-12 1000 MCG tablet Commonly known as: CYANOCOBALAMIN Take 1,000 mcg by mouth daily.        Allergies:  Allergies  Allergen Reactions   Ciprofloxacin Itching   Contrast Media [Iodinated Diagnostic Agents] Itching    Gets hot   Erythromycin     Stomach pain   Ioxaglate Itching   Metrizamide Itching   Codeine Itching   Hydrocodone-Acetaminophen Itching   Metformin And Related Diarrhea    The plain metformin caused severe diarrhea but the ER version doesnt    Family History: Family History  Problem Relation Age of Onset   Colon cancer Father     Social History:  reports that he has never smoked. He has never used smokeless tobacco. He reports current alcohol use. He reports that he does not use drugs.  ROS: All other review of systems were reviewed and are negative except what is noted above in HPI   Laboratory Data: Lab Results  Component Value Date   WBC 5.8  08/09/2020   HGB 18.2 (H) 08/09/2020   HCT 52.0 (H) 08/09/2020   MCV 89 08/09/2020   PLT 166 12/20/2017    Lab Results  Component Value Date   CREATININE 1.00 08/09/2020    No results found for: PSA  Lab Results  Component Value Date   TESTOSTERONE 391 02/03/2021    No results found for: HGBA1C  Urinalysis    Component Value Date/Time   COLORURINE ORANGE (A) 03/20/2019 1020   APPEARANCEUR Clear 08/09/2020 1546   LABSPEC  03/20/2019 1020    TEST NOT REPORTED DUE TO COLOR INTERFERENCE OF URINE PIGMENT   LABSPEC  1.015 11/03/2013 1218   PHURINE  03/20/2019 1020    TEST NOT REPORTED DUE TO COLOR INTERFERENCE OF URINE PIGMENT   GLUCOSEU Negative 08/09/2020 1546   GLUCOSEU NEGATIVE 11/03/2013 1218   HGBUR (A) 03/20/2019 1020    TEST NOT REPORTED DUE TO COLOR INTERFERENCE OF URINE PIGMENT   BILIRUBINUR Negative 08/09/2020 1546   BILIRUBINUR Negative 11/13/2011 1910   KETONESUR (A) 03/20/2019 1020    TEST NOT REPORTED DUE TO COLOR INTERFERENCE OF URINE PIGMENT   PROTEINUR Negative 08/09/2020 1546   PROTEINUR (A) 03/20/2019 1020    TEST NOT REPORTED DUE TO COLOR INTERFERENCE OF URINE PIGMENT   UROBILINOGEN 2.0 (H) 08/23/2014 2100   NITRITE Negative 08/09/2020 1546   NITRITE (A) 03/20/2019 1020    TEST NOT REPORTED DUE TO COLOR INTERFERENCE OF URINE PIGMENT   LEUKOCYTESUR Negative 08/09/2020 1546   LEUKOCYTESUR (A) 03/20/2019 1020    TEST NOT REPORTED DUE TO COLOR INTERFERENCE OF URINE PIGMENT   LEUKOCYTESUR Negative 11/13/2011 1910    Lab Results  Component Value Date   LABMICR Comment 08/09/2020   BACTERIA NONE SEEN 03/20/2019    Pertinent Imaging:  No results found for this or any previous visit.  No results found for this or any previous visit.  No results found for this or any previous visit.  No results found for this or any previous visit.  No results found for this or any previous visit.  No results found for this or any previous visit.  No results  found for this or any previous visit.  No results found for this or any previous visit.   Assessment & Plan:    1. Benign localized prostatic hyperplasia with lower urinary tract symptoms (LUTS) -continue flomax 0.4mg  daily  2. Male hypogonadism -Continue Im testosterone 120mg  weekly -RTC 6 months with testosterone labs  3. Erectile dysfunction due to arterial insufficiency -tadalafil 20mg  prn   No follow-ups on file.  Nicolette Bang, MD  [facility] [place]

## 2021-02-20 ENCOUNTER — Ambulatory Visit: Payer: BC Managed Care – PPO | Admitting: Urology

## 2021-05-22 ENCOUNTER — Encounter: Payer: Self-pay | Admitting: Emergency Medicine

## 2021-05-22 ENCOUNTER — Inpatient Hospital Stay
Admission: EM | Admit: 2021-05-22 | Discharge: 2021-05-28 | DRG: 872 | Disposition: A | Discharge status: Home / Self Care | Payer: BC Managed Care – PPO | Source: Ambulatory Visit | Attending: Internal Medicine | Admitting: Internal Medicine

## 2021-05-22 ENCOUNTER — Emergency Department: Payer: BC Managed Care – PPO

## 2021-05-22 ENCOUNTER — Other Ambulatory Visit: Payer: Self-pay

## 2021-05-22 DIAGNOSIS — Z800 Family history of malignant neoplasm of digestive organs: Secondary | ICD-10-CM

## 2021-05-22 DIAGNOSIS — Z20822 Contact with and (suspected) exposure to covid-19: Secondary | ICD-10-CM | POA: Diagnosis present

## 2021-05-22 DIAGNOSIS — R6520 Severe sepsis without septic shock: Secondary | ICD-10-CM | POA: Diagnosis present

## 2021-05-22 DIAGNOSIS — N200 Calculus of kidney: Secondary | ICD-10-CM | POA: Diagnosis present

## 2021-05-22 DIAGNOSIS — Z7984 Long term (current) use of oral hypoglycemic drugs: Secondary | ICD-10-CM

## 2021-05-22 DIAGNOSIS — N419 Inflammatory disease of prostate, unspecified: Secondary | ICD-10-CM | POA: Diagnosis present

## 2021-05-22 DIAGNOSIS — N400 Enlarged prostate without lower urinary tract symptoms: Secondary | ICD-10-CM | POA: Diagnosis present

## 2021-05-22 DIAGNOSIS — K219 Gastro-esophageal reflux disease without esophagitis: Secondary | ICD-10-CM | POA: Diagnosis present

## 2021-05-22 DIAGNOSIS — E119 Type 2 diabetes mellitus without complications: Secondary | ICD-10-CM

## 2021-05-22 DIAGNOSIS — A419 Sepsis, unspecified organism: Principal | ICD-10-CM | POA: Diagnosis present

## 2021-05-22 DIAGNOSIS — I10 Essential (primary) hypertension: Secondary | ICD-10-CM | POA: Diagnosis present

## 2021-05-22 DIAGNOSIS — E785 Hyperlipidemia, unspecified: Secondary | ICD-10-CM | POA: Diagnosis present

## 2021-05-22 DIAGNOSIS — N179 Acute kidney failure, unspecified: Secondary | ICD-10-CM | POA: Diagnosis present

## 2021-05-22 DIAGNOSIS — G4730 Sleep apnea, unspecified: Secondary | ICD-10-CM | POA: Diagnosis present

## 2021-05-22 DIAGNOSIS — Z7982 Long term (current) use of aspirin: Secondary | ICD-10-CM | POA: Diagnosis not present

## 2021-05-22 DIAGNOSIS — R000 Tachycardia, unspecified: Secondary | ICD-10-CM | POA: Diagnosis present

## 2021-05-22 DIAGNOSIS — N401 Enlarged prostate with lower urinary tract symptoms: Secondary | ICD-10-CM | POA: Diagnosis present

## 2021-05-22 DIAGNOSIS — N390 Urinary tract infection, site not specified: Secondary | ICD-10-CM | POA: Diagnosis present

## 2021-05-22 DIAGNOSIS — N412 Abscess of prostate: Secondary | ICD-10-CM | POA: Diagnosis present

## 2021-05-22 DIAGNOSIS — Z79899 Other long term (current) drug therapy: Secondary | ICD-10-CM

## 2021-05-22 DIAGNOSIS — J309 Allergic rhinitis, unspecified: Secondary | ICD-10-CM | POA: Diagnosis present

## 2021-05-22 DIAGNOSIS — G4733 Obstructive sleep apnea (adult) (pediatric): Secondary | ICD-10-CM | POA: Diagnosis present

## 2021-05-22 DIAGNOSIS — Z79890 Hormone replacement therapy (postmenopausal): Secondary | ICD-10-CM | POA: Diagnosis not present

## 2021-05-22 HISTORY — DX: Fatty (change of) liver, not elsewhere classified: K76.0

## 2021-05-22 HISTORY — DX: Other specified health status: Z78.9

## 2021-05-22 LAB — COMPREHENSIVE METABOLIC PANEL
ALT: 35 U/L (ref 0–44)
AST: 27 U/L (ref 15–41)
Albumin: 4.4 g/dL (ref 3.5–5.0)
Alkaline Phosphatase: 55 U/L (ref 38–126)
Anion gap: 12 (ref 5–15)
BUN: 27 mg/dL — ABNORMAL HIGH (ref 6–20)
CO2: 27 mmol/L (ref 22–32)
Calcium: 9.2 mg/dL (ref 8.9–10.3)
Chloride: 100 mmol/L (ref 98–111)
Creatinine, Ser: 1.93 mg/dL — ABNORMAL HIGH (ref 0.61–1.24)
GFR, Estimated: 40 mL/min — ABNORMAL LOW (ref >60)
Glucose, Bld: 172 mg/dL — ABNORMAL HIGH (ref 70–99)
Potassium: 4.4 mmol/L (ref 3.5–5.1)
Sodium: 139 mmol/L (ref 135–145)
Total Bilirubin: 1.4 mg/dL — ABNORMAL HIGH (ref 0.3–1.2)
Total Protein: 7 g/dL (ref 6.5–8.1)

## 2021-05-22 LAB — CBC
HCT: 49.1 % (ref 39.0–52.0)
Hemoglobin: 17.8 g/dL — ABNORMAL HIGH (ref 13.0–17.0)
MCH: 32.7 pg (ref 26.0–34.0)
MCHC: 36.3 g/dL — ABNORMAL HIGH (ref 30.0–36.0)
MCV: 90.1 fL (ref 80.0–100.0)
Platelets: 186 10*3/uL (ref 150–400)
RBC: 5.45 MIL/uL (ref 4.22–5.81)
RDW: 13.2 % (ref 11.5–15.5)
WBC: 24 10*3/uL — ABNORMAL HIGH (ref 4.0–10.5)
nRBC: 0 % (ref 0.0–0.2)

## 2021-05-22 LAB — HIV ANTIBODY (ROUTINE TESTING W REFLEX): HIV Screen 4th Generation wRfx: NONREACTIVE

## 2021-05-22 LAB — GLUCOSE, CAPILLARY: Glucose-Capillary: 117 mg/dL — ABNORMAL HIGH (ref 70–99)

## 2021-05-22 LAB — RESP PANEL BY RT-PCR (FLU A&B, COVID) ARPGX2
Influenza A by PCR: NEGATIVE
Influenza B by PCR: NEGATIVE
SARS Coronavirus 2 by RT PCR: NEGATIVE

## 2021-05-22 LAB — CBG MONITORING, ED: Glucose-Capillary: 161 mg/dL — ABNORMAL HIGH (ref 70–99)

## 2021-05-22 LAB — LACTIC ACID, PLASMA
Lactic Acid, Venous: 1.9 mmol/L (ref 0.5–1.9)
Lactic Acid, Venous: 1.7 mmol/L (ref 0.5–1.9)

## 2021-05-22 MED ORDER — MAGNESIUM OXIDE 400 MG PO TABS
400.0000 mg | ORAL_TABLET | ORAL | Status: DC
  Administered 2021-05-23 – 2021-05-28 (×3): 400 mg via ORAL
  Filled 2021-05-22 (×8): qty 1

## 2021-05-22 MED ORDER — ONDANSETRON HCL 4 MG PO TABS: 4.0000 mg | ORAL_TABLET | Freq: Four times a day (QID) | ORAL | Status: DC | PRN

## 2021-05-22 MED ORDER — SODIUM CHLORIDE 0.9 % IV BOLUS
1000.0000 mL | Freq: Once | INTRAVENOUS | Status: AC
  Administered 2021-05-22: 1000 mL via INTRAVENOUS

## 2021-05-22 MED ORDER — ONDANSETRON HCL 4 MG/2ML IJ SOLN
4.0000 mg | Freq: Four times a day (QID) | INTRAMUSCULAR | Status: DC | PRN
  Administered 2021-05-22 – 2021-05-23 (×2): 4 mg via INTRAVENOUS
  Filled 2021-05-22 (×2): qty 2

## 2021-05-22 MED ORDER — VITAMIN B-12 1000 MCG PO TABS
1000.0000 ug | ORAL_TABLET | Freq: Every day | ORAL | Status: DC
  Administered 2021-05-22 – 2021-05-28 (×7): 1000 ug via ORAL
  Filled 2021-05-22 (×7): qty 1

## 2021-05-22 MED ORDER — RISAQUAD PO CAPS
1.0000 | ORAL_CAPSULE | Freq: Every day | ORAL | Status: DC
  Administered 2021-05-22 – 2021-05-28 (×7): 1 via ORAL
  Filled 2021-05-22 (×8): qty 1

## 2021-05-22 MED ORDER — ENOXAPARIN SODIUM 40 MG/0.4ML IJ SOSY
40.0000 mg | PREFILLED_SYRINGE | INTRAMUSCULAR | Status: DC
  Administered 2021-05-22 – 2021-05-27 (×6): 40 mg via SUBCUTANEOUS
  Filled 2021-05-22 (×6): qty 0.4

## 2021-05-22 MED ORDER — LACTATED RINGERS IV SOLN: INTRAVENOUS | Status: DC

## 2021-05-22 MED ORDER — MORPHINE SULFATE (PF) 2 MG/ML IV SOLN
2.0000 mg | INTRAVENOUS | Status: DC | PRN
  Administered 2021-05-22 – 2021-05-23 (×2): 2 mg via INTRAVENOUS
  Filled 2021-05-22 (×3): qty 1

## 2021-05-22 MED ORDER — LACTATED RINGERS IV BOLUS
250.0000 mL | Freq: Once | INTRAVENOUS | Status: AC
  Administered 2021-05-22: 250 mL via INTRAVENOUS

## 2021-05-22 MED ORDER — LACTATED RINGERS IV BOLUS
1000.0000 mL | Freq: Once | INTRAVENOUS | Status: AC
  Administered 2021-05-22: 1000 mL via INTRAVENOUS

## 2021-05-22 MED ORDER — FLUTICASONE PROPIONATE 50 MCG/ACT NA SUSP
1.0000 | Freq: Every day | NASAL | Status: DC | PRN
  Filled 2021-05-22: qty 16

## 2021-05-22 MED ORDER — PROBIOTIC ADVANCED PO CAPS: ORAL_CAPSULE | Freq: Every day | ORAL | Status: DC

## 2021-05-22 MED ORDER — HYDROMORPHONE HCL 1 MG/ML IJ SOLN
1.0000 mg | Freq: Once | INTRAMUSCULAR | Status: AC
  Administered 2021-05-22: 1 mg via INTRAVENOUS
  Filled 2021-05-22: qty 1

## 2021-05-22 MED ORDER — VITAMIN D 25 MCG (1000 UNIT) PO TABS
5000.0000 [IU] | ORAL_TABLET | Freq: Every day | ORAL | Status: DC
  Administered 2021-05-22 – 2021-05-28 (×7): 5000 [IU] via ORAL
  Filled 2021-05-22 (×7): qty 5

## 2021-05-22 MED ORDER — ADULT MULTIVITAMIN W/MINERALS CH
1.0000 | ORAL_TABLET | Freq: Every day | ORAL | Status: DC
  Administered 2021-05-22 – 2021-05-28 (×7): 1 via ORAL
  Filled 2021-05-22 (×6): qty 1

## 2021-05-22 MED ORDER — CYCLOBENZAPRINE HCL 10 MG PO TABS
10.0000 mg | ORAL_TABLET | Freq: Three times a day (TID) | ORAL | Status: DC | PRN
  Administered 2021-05-22 – 2021-05-25 (×6): 10 mg via ORAL
  Filled 2021-05-22 (×6): qty 1

## 2021-05-22 MED ORDER — COQ10 200 MG PO CAPS: 200.0000 mg | ORAL_CAPSULE | Freq: Every day | ORAL | Status: DC

## 2021-05-22 MED ORDER — ACETAMINOPHEN 500 MG PO TABS
1000.0000 mg | ORAL_TABLET | Freq: Four times a day (QID) | ORAL | Status: DC | PRN
  Administered 2021-05-23 – 2021-05-26 (×9): 1000 mg via ORAL
  Filled 2021-05-22 (×9): qty 2

## 2021-05-22 MED ORDER — ONDANSETRON HCL 4 MG/2ML IJ SOLN
4.0000 mg | Freq: Once | INTRAMUSCULAR | Status: AC
  Administered 2021-05-22: 4 mg via INTRAVENOUS
  Filled 2021-05-22: qty 2

## 2021-05-22 MED ORDER — TAMSULOSIN HCL 0.4 MG PO CAPS
0.4000 mg | ORAL_CAPSULE | Freq: Every day | ORAL | Status: DC
  Administered 2021-05-22 – 2021-05-27 (×6): 0.4 mg via ORAL
  Filled 2021-05-22 (×6): qty 1

## 2021-05-22 MED ORDER — INSULIN ASPART 100 UNIT/ML IJ SOLN
0.0000 [IU] | Freq: Three times a day (TID) | INTRAMUSCULAR | Status: DC
Start: 2021-05-22 — End: 2021-05-28
  Administered 2021-05-22: 3 [IU] via SUBCUTANEOUS
  Administered 2021-05-23: 2 [IU] via SUBCUTANEOUS
  Administered 2021-05-23: 3 [IU] via SUBCUTANEOUS
  Administered 2021-05-24: 2 [IU] via SUBCUTANEOUS
  Administered 2021-05-24 – 2021-05-25 (×3): 3 [IU] via SUBCUTANEOUS
  Administered 2021-05-25 – 2021-05-26 (×2): 2 [IU] via SUBCUTANEOUS
  Administered 2021-05-26 – 2021-05-27 (×2): 3 [IU] via SUBCUTANEOUS
  Administered 2021-05-27 (×2): 5 [IU] via SUBCUTANEOUS
  Administered 2021-05-28: 3 [IU] via SUBCUTANEOUS
  Filled 2021-05-22 (×14): qty 1

## 2021-05-22 MED ORDER — PRAVASTATIN SODIUM 20 MG PO TABS
20.0000 mg | ORAL_TABLET | Freq: Every day | ORAL | Status: DC
  Administered 2021-05-22 – 2021-05-27 (×6): 20 mg via ORAL
  Filled 2021-05-22 (×6): qty 1

## 2021-05-22 MED ORDER — SODIUM CHLORIDE 0.9 % IV SOLN
2.0000 g | INTRAVENOUS | Status: DC
Start: 2021-05-22 — End: 2021-05-27
  Administered 2021-05-22 – 2021-05-27 (×6): 2 g via INTRAVENOUS
  Filled 2021-05-22 (×2): qty 20
  Filled 2021-05-22 (×3): qty 2
  Filled 2021-05-22: qty 20

## 2021-05-22 MED ORDER — MONTELUKAST SODIUM 10 MG PO TABS
10.0000 mg | ORAL_TABLET | Freq: Every day | ORAL | Status: DC
  Administered 2021-05-22 – 2021-05-27 (×6): 10 mg via ORAL
  Filled 2021-05-22 (×6): qty 1

## 2021-05-22 MED ORDER — ACETAMINOPHEN 500 MG PO TABS
1000.0000 mg | ORAL_TABLET | Freq: Three times a day (TID) | ORAL | Status: DC | PRN
  Administered 2021-05-22: 1000 mg via ORAL
  Filled 2021-05-22: qty 2

## 2021-05-22 MED ORDER — SODIUM CHLORIDE 0.9 % IV SOLN: INTRAVENOUS | Status: DC

## 2021-05-22 MED ORDER — LACTATED RINGERS IV BOLUS (SEPSIS)
2000.0000 mL | Freq: Once | INTRAVENOUS | Status: AC
  Administered 2021-05-22: 2000 mL via INTRAVENOUS

## 2021-05-22 NOTE — ED Notes (Signed)
Lindsay RN aware of assigned bed 

## 2021-05-22 NOTE — ED Notes (Signed)
Pt given water and blanket 

## 2021-05-22 NOTE — ED Triage Notes (Signed)
C/O low back pain.  States has history of kidney stones and was sent to ED from [facility].  Also c/o cloudy urine. Symptoms started yesterday at 1700.  C/O passing urine, but only small amounts at a time.  AAOx3.  Skin warm and dry. NAD.

## 2021-05-22 NOTE — H&P (Signed)
History and Physical    [name] XBM:[id number] DOB: [birth year] DOA: 05/22/2021  PCP: Dion Body, MD   Patient coming from: Home  I have personally briefly reviewed patient's old medical records in [place]  Chief Complaint: Chills  HPI: [name] is a 60 y.o. male with medical history significant for nephrolithiasis, diabetes mellitus, BPH, hypertension, sleep apnea, hiatal hernia with GERD who presents to the ER from his primary care provider's office for evaluation of low back pain which started 1 day prior to his admission. Patient states that he developed sudden onset chills and had a fever (but did not check his temp at home).  He subsequently developed pain in the left lumbar area as well as dysuria and urgency.  He denies having any frequency or hematuria. He rated his left lumbar pain at a 5 x 10 in intensity at its worst.  It was non radiating and felt similar to his prior episodes with his kidney stones.  He went to see his primary care provider and will present to the emergency room for further evaluation. He complains of feeling dizzy and lightheaded but denies having any chest pain, no shortness of breath, no cough, no lower extremity swelling, no headache, no blurred vision, no nausea, no vomiting, no changes in his bowel habits. Labs show sodium 139, potassium 4.4, chloride 100, bicarb 27, glucose 172, BUN 27, creatinine 1.93 above a baseline 1.0, calcium 9.2, alkaline phosphatase 54.4, AST 27, ALT 35, total protein 7.0, total bilirubin 1.4, lactic acid 1.9, white count 24,000, hemoglobin 17.8, hematocrit 49.1, MCV 90.1, RDW 13.2, platelet count 186 CT renal stone study shows left nephrolithiasis.  No obstructive uropathy.  Hepatic steatosis.  BPH. Twelve-lead EKG reviewed by me shows normal sinus rhythm.  Incomplete right bundle branch block.  ED Course: Patient is a 60 year old male who presents to the ER for evaluation of fever, chills, dysuria and  urgency. He appears to be septic and upon arrival to the ER had a blood pressure of 93/66, with a pulse rate of 104, marked leukocytosis, acute kidney injury and pyuria. He received sepsis IV fluids in the ER as well as antibiotic therapy will be admitted to the hospital for further evaluation.      Review of Systems: As per HPI otherwise all other systems reviewed and negative.    Past Medical History:  Diagnosis Date   BPH (benign prostatic hyperplasia)    Diabetes mellitus without complication (HCC)    GERD (gastroesophageal reflux disease)    no meds   History of hiatal hernia    Hyperlipidemia    Hypertension    Low testosterone    Polycythemia    Sleep apnea    uses a cpap nightly    Past Surgical History:  Procedure Laterality Date   COLONOSCOPY     I & D EXTREMITY Left 06/20/2013   Procedure: MINOR IRRIGATION AND DEBRIDEMENT LEFT INDEX FINGER;  Surgeon: Tennis Must, MD;  Location: [facility];  Service: Orthopedics;  Laterality: Left;   NASAL SINUS SURGERY     UPPER GASTROINTESTINAL ENDOSCOPY     WISDOM TOOTH EXTRACTION       reports that he has never smoked. He has never used smokeless tobacco. He reports current alcohol use. He reports that he does not use drugs.  Allergies  Allergen Reactions   Ciprofloxacin Itching   Contrast Media [Iodinated Diagnostic Agents] Itching    Gets hot   Erythromycin  Stomach pain   Ioxaglate Itching   Metrizamide Itching   Codeine Itching   Hydrocodone-Acetaminophen Itching   Metformin And Related Diarrhea    The plain metformin caused severe diarrhea but the ER version doesnt    Family History  Problem Relation Age of Onset   Colon cancer Father       Prior to Admission medications   Medication Sig Start Date End Date Taking? Authorizing Provider  acetaminophen (TYLENOL) 500 MG tablet Take 1,000 mg by mouth every 8 (eight) hours as needed for moderate pain or headache.   Yes [provider]  aspirin 81 MG EC tablet Take 162 mg by mouth daily.    Yes [provider]  Calcium 250 MG CAPS Take 250 mg by mouth daily.   Yes [provider]  cetirizine (ZYRTEC) 10 MG tablet Take 10 mg by mouth daily as needed for allergies.    Yes [provider]  Cholecalciferol (DIALYVITE VITAMIN D 5000) 125 MCG (5000 UT) capsule Take 5,000 Units by mouth daily.   Yes [provider]  Coenzyme Q10 (COQ10) 200 MG CAPS Take 200 mg by mouth daily.   Yes [provider]  cyclobenzaprine (FLEXERIL) 10 MG tablet Take 1 tablet by mouth every 8 (eight) hours as needed. 09/19/20  Yes [provider]  fluticasone (FLONASE) 50 MCG/ACT nasal spray Place 1 spray into the nose daily as needed for allergies.    Yes [provider]  guaiFENesin (MUCINEX) 600 MG 12 hr tablet Take 600 mg by mouth every 12 (twelve) hours as needed (congestion).    Yes [provider]  ibuprofen (ADVIL) 200 MG tablet Take 400 mg by mouth every 6 (six) hours as needed for headache or moderate pain.   Yes [provider]  lisinopril (ZESTRIL) 20 MG tablet Take 20 mg by mouth daily.  06/26/19  Yes [provider]  lovastatin (MEVACOR) 20 MG tablet Take 20 mg by mouth at bedtime.   Yes [provider]  magnesium oxide (MAG-OX) 400 MG tablet Take 400 mg by mouth every Monday, Wednesday, and Friday.   Yes [provider]  metFORMIN (GLUCOPHAGE-XR) 500 MG 24 hr tablet Take 500 mg by mouth 2 (two) times daily.   Yes [provider]  montelukast (SINGULAIR) 10 MG tablet Take 10 mg by mouth at bedtime.  04/07/20  Yes [provider]  Multiple Vitamin (MULTIVITAMIN) tablet Take 1 tablet by mouth daily.   Yes [provider]  Potassium 99 MG TABS Take 99 mg by mouth daily.   Yes [provider]  Probiotic Product (PROBIOTIC ADVANCED PO) Take 1 tablet by mouth daily.   Yes [provider]   pseudoephedrine (SUDAFED) 30 MG tablet Take 30 mg by mouth every 4 (four) hours as needed for congestion.    Yes [provider]  tadalafil (CIALIS) 20 MG tablet Take 1 tablet (20 mg total) by mouth daily as needed for erectile dysfunction. 02/07/21  Yes McKenzie, Candee Furbish, MD  tamsulosin (FLOMAX) 0.4 MG CAPS capsule TAKE 1 CAPSULE BY MOUTH AT BEDTIME 02/10/21  Yes McKenzie, Candee Furbish, MD  vitamin B-12 (CYANOCOBALAMIN) 1000 MCG tablet Take 1,000 mcg by mouth daily.   Yes [provider]  albuterol (VENTOLIN HFA) 108 (90 Base) MCG/ACT inhaler Inhale 2 puffs into the lungs every 4 (four) hours as needed for wheezing or shortness of breath.  12/22/19 12/21/20  [provider]  azelastine (ASTELIN) 0.1 % nasal spray Place  1 spray into both nostrils 2 (two) times daily as needed for allergies.  12/25/19 12/24/20  [provider]  benzonatate (TESSALON) 200 MG capsule Take 200 mg by mouth 3 (three) times daily as needed for cough. Patient not taking: Reported on 05/22/2021    [provider]  MOBIC 15 MG tablet Take 15 mg by mouth daily.  Patient not taking: Reported on 05/22/2021 11/06/19   [provider]  testosterone cypionate (DEPOTESTOSTERONE CYPIONATE) 200 MG/ML injection Inject 0.6 mLs (120 mg total) into the muscle every 7 (seven) days. 02/07/21   Cleon Gustin, MD    Physical Exam: Vitals:   05/22/21 0826 05/22/21 0829 05/22/21 1048 05/22/21 1112  BP:  93/66 130/62 104/67  Pulse:  (!) 104 96 100  Resp:  18 19 19   Temp:  98.6 F (37 C)    TempSrc:  Oral    SpO2:  96% 97% 100%  Weight: 104.3 kg     Height: 6\' 3"  (1.905 m)        Vitals:   05/22/21 0826 05/22/21 0829 05/22/21 1048 05/22/21 1112  BP:  93/66 130/62 104/67  Pulse:  (!) 104 96 100  Resp:  18 19 19   Temp:  98.6 F (37 C)    TempSrc:  Oral    SpO2:  96% 97% 100%  Weight: 104.3 kg     Height: 6\' 3"  (1.905 m)         Constitutional: Alert and oriented x 3 . Not in  any apparent distress HEENT:      Head: Normocephalic and atraumatic.         Eyes: PERLA, EOMI, Conjunctivae are normal. Sclera is non-icteric.       Mouth/Throat: Mucous membranes are moist.       Neck: Supple with no signs of meningismus. Cardiovascular: Tachycardic. No murmurs, gallops, or rubs. 2+ symmetrical distal pulses are present . No JVD. No LE edema Respiratory: Respiratory effort normal .Lungs sounds clear bilaterally. No wheezes, crackles, or rhonchi.  Gastrointestinal: Soft, non tender, and non distended with positive bowel sounds.  Genitourinary: No CVA tenderness. Musculoskeletal: Nontender with normal range of motion in all extremities. No cyanosis, or erythema of extremities. Neurologic:  Face is symmetric. Moving all extremities. No gross focal neurologic deficits . Skin: Skin is warm, dry.  No rash or ulcers Psychiatric: Mood and affect are normal    Labs on Admission: I have personally reviewed following labs and imaging studies  CBC: Recent Labs  Lab 05/22/21 0833  WBC 24.0*  HGB 17.8*  HCT 49.1  MCV 90.1  PLT 433   Basic Metabolic Panel: Recent Labs  Lab 05/22/21 0833  NA 139  K 4.4  CL 100  CO2 27  GLUCOSE 172*  BUN 27*  CREATININE 1.93*  CALCIUM 9.2   GFR: Estimated Creatinine Clearance: 54.5 mL/min (A) (by C-G formula based on SCr of 1.93 mg/dL (H)). Liver Function Tests: Recent Labs  Lab 05/22/21 0833  AST 27  ALT 35  ALKPHOS 55  BILITOT 1.4*  PROT 7.0  ALBUMIN 4.4   No results for input(s): LIPASE, AMYLASE in the last 168 hours. No results for input(s): AMMONIA in the last 168 hours. Coagulation Profile: No results for input(s): INR, PROTIME in the last 168 hours. Cardiac Enzymes: No results for input(s): CKTOTAL, CKMB, CKMBINDEX, TROPONINI in the last 168 hours. BNP (last 3 results) No results for input(s): PROBNP in the last 8760 hours. HbA1C: No results for input(s): HGBA1C  in the last 72 hours. CBG: No results for  input(s): GLUCAP in the last 168 hours. Lipid Profile: No results for input(s): CHOL, HDL, LDLCALC, TRIG, CHOLHDL, LDLDIRECT in the last 72 hours. Thyroid Function Tests: No results for input(s): TSH, T4TOTAL, FREET4, T3FREE, THYROIDAB in the last 72 hours. Anemia Panel: No results for input(s): VITAMINB12, FOLATE, FERRITIN, TIBC, IRON, RETICCTPCT in the last 72 hours. Urine analysis:    Component Value Date/Time   COLORURINE AMBER (A) 05/22/2021 0833   APPEARANCEUR CLOUDY (A) 05/22/2021 0833   APPEARANCEUR Clear 08/09/2020 1546   LABSPEC 1.032 (H) 05/22/2021 0833   LABSPEC 1.015 11/03/2013 1218   PHURINE 5.0 05/22/2021 0833   GLUCOSEU NEGATIVE 05/22/2021 0833   GLUCOSEU NEGATIVE 11/03/2013 1218   HGBUR MODERATE (A) 05/22/2021 0833   BILIRUBINUR SMALL (A) 05/22/2021 0833   BILIRUBINUR Negative 08/09/2020 1546   BILIRUBINUR Negative 11/13/2011 1910   KETONESUR 5 (A) 05/22/2021 0833   PROTEINUR >=300 (A) 05/22/2021 0833   UROBILINOGEN 2.0 (H) 08/23/2014 2100   NITRITE NEGATIVE 05/22/2021 0833   LEUKOCYTESUR MODERATE (A) 05/22/2021 0833   LEUKOCYTESUR Negative 11/13/2011 1910    Radiological Exams on Admission: CT Renal Stone Study  Result Date: 05/22/2021 CLINICAL DATA:  Bilateral flank pain.  Dysuria. EXAM: CT ABDOMEN AND PELVIS WITHOUT CONTRAST TECHNIQUE: Multidetector CT imaging of the abdomen and pelvis was performed following the standard protocol without IV contrast. COMPARISON:  01/13/2021 FINDINGS: Lower chest: Clear lung bases. Normal heart size without pericardial or pleural effusion. Hepatobiliary: Moderate hepatic steatosis. Normal gallbladder, without biliary ductal dilatation. Pancreas: Normal, without mass or ductal dilatation. Spleen: Normal in size, without focal abnormality. Adrenals/Urinary Tract: Normal adrenal glands. Punctate interpolar left renal collecting system calculus. No right renal calculi. No hydronephrosis. No hydroureter or ureteric calculi. No bladder  calculi. Stomach/Bowel: Small bowel malrotation again identified. Normal position of the ileocecal junction. Appendix is not visualized but there is no evidence of right lower quadrant inflammation. Otherwise normal small bowel. Vascular/Lymphatic: Aortic atherosclerosis. No abdominopelvic adenopathy. Reproductive: Mild prostatomegaly. Other: No significant free fluid.  No free intraperitoneal air. 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 Lower thoracic and less so upper lumbar spondylosis. IMPRESSION: 1. Left nephrolithiasis. No obstructive uropathy. 2. Hepatic steatosis. 3. Small bowel malrotation, as before. 4. Prostatomegaly. 5. Aortic Atherosclerosis (ICD10-I70.0). Electronically Signed   By: Abigail Miyamoto M.D.   On: 05/22/2021 10:35     Assessment/Plan Principal Problem:   Sepsis secondary to UTI [facility]) Active Problems:   Essential hypertension   BPH (benign prostatic hyperplasia)   Diabetes mellitus without complication (HCC)   AKI (acute kidney injury) ([place])   Sleep apnea     Patient is a 60 year old male who presents to the ER for evaluation of dysuria, urgency, left flank pain and chills.    Sepsis from urinary source (POA) with acute organ dysfunction As evidenced by marked leukocytosis, pyuria, tachycardia and hypotension Patient noted to have worsening of his renal function from baseline, at baseline history creatinine is 1.0 and on admission today it is 1.93 Aggressive IV fluid resuscitation Place patient empirically on IV Rocephin Follow-up results of blood and urine culture     Acute kidney injury Most likely secondary to ATN from hypotension At baseline patient has a serum creatinine of 1.0 on admission it is 1.93 Hold lisinopril Aggressive IV fluid hydration Repeat renal parameters in a.m.      History of hypertension Hold lisinopril due to hypotension and AKI    Diabetes mellitus Hold  oral hypoglycemic agents Glycemic  control with sliding scale insulin     BPH Continue Flomax    DVT prophylaxis: Lovenox  Code Status: full code  Family Communication: Greater than 50% of time was spent discussing patient's condition and plan of care with him at the bedside.  All questions and concerns have been addressed.  He verbalizes understanding and agrees with the plan.   Disposition Plan: Back to previous home environment Consults called: none  Status:At the time of admission, it appears that the appropriate admission status for this patient is inpatient. This is judged to be reasonable and necessary to provide the required intensity of service to ensure the patient's safety given the presenting symptoms, physical exam findings, and initial radiographic and laboratory data in the context of their comorbid conditions. Patient requires inpatient status due to high intensity of service, high risk for further deterioration and high frequency of surveillance required.     Saleema Weppler MD [facility]     05/22/2021, 1:16 PM

## 2021-05-22 NOTE — ED Notes (Signed)
Pt presents to ED with c/o of bilateral flank pain. Pt states chills but denies taking actual temp. Pt also has c/o of dysuria. Pt also has HX of kidney stones. Pt states this started yesterday at 0500. Pt is A&Ox4. Pt states she is passing urine but states "doesn't feel like he empties". NAD noted. Pt presented hypotensive but is maintaining a good MAP at this time.

## 2021-05-22 NOTE — Progress Notes (Signed)
Elink following for code sepsis 

## 2021-05-22 NOTE — ED Notes (Signed)
Pt O2 sat decreased to 80% on RA. Pt just received pain medications and has a hx of sleep apnea. Pt placed on 2L [place]. Elsie Amis, primary RN made aware.

## 2021-05-22 NOTE — Consult Note (Signed)
CODE SEPSIS - PHARMACY COMMUNICATION  **Broad Spectrum Antibiotics should be administered within 1 hour of Sepsis diagnosis**  Time Code Sepsis Called/Page Received: 0930  Antibiotics Ordered: Ceftriaxone  Time of 1st antibiotic administration: 0932  Additional action taken by pharmacy: none  If necessary, Name of Provider/Nurse Contacted: n/a   [name] PharmD, BCPS 05/22/2021 9:50 AM

## 2021-05-22 NOTE — ED Provider Notes (Signed)
[facility] Emergency Department Provider Note  ____________________________________________   Event Date/Time   First MD Initiated Contact with Patient 05/22/21 0920     (approximate)  I have reviewed the triage vital signs and the nursing notes.   HISTORY  Chief Complaint Back Pain    HPI [name] is a 60 y.o. male  with h/o DM, HTN, HLD, here with back pain, flank pain. Pt reports fairly acute onset of L flank pain, lower back pain yesterday. He had this along with fevers, chills, body aches. He has had nausea, no vomiting. Pain is aching, gnawing and worse w/ movement, palpation. H/o kidney stones. H/o UTI and BPH as well. Denies any urinary sx throughout the day yesterday or preceding days. No overt hematuria.        Past Medical History:  Diagnosis Date   BPH (benign prostatic hyperplasia)    Diabetes mellitus without complication (HCC)    GERD (gastroesophageal reflux disease)    no meds   History of hiatal hernia    Hyperlipidemia    Hypertension    Low testosterone    Polycythemia    Sleep apnea    uses a cpap nightly    Patient Active Problem List   Diagnosis Date Noted   Partial small bowel obstruction ([place]) 12/18/2017   Essential hypertension 12/18/2017   Low testosterone in male 12/18/2017    Past Surgical History:  Procedure Laterality Date   COLONOSCOPY     I & D EXTREMITY Left 06/20/2013   Procedure: MINOR IRRIGATION AND DEBRIDEMENT LEFT INDEX FINGER;  Surgeon: Tennis Must, MD;  Location: [facility];  Service: Orthopedics;  Laterality: Left;   NASAL SINUS SURGERY     UPPER GASTROINTESTINAL ENDOSCOPY     WISDOM TOOTH EXTRACTION      Prior to Admission medications   Medication Sig Start Date End Date Taking? Authorizing Provider  acetaminophen (TYLENOL) 500 MG tablet Take 1,000 mg by mouth every 8 (eight) hours as needed for moderate pain or headache.    [provider]  albuterol  (VENTOLIN HFA) 108 (90 Base) MCG/ACT inhaler Inhale 2 puffs into the lungs every 4 (four) hours as needed for wheezing or shortness of breath.  12/22/19 12/21/20  [provider]  aspirin 81 MG EC tablet Take 162 mg by mouth daily.     [provider]  azelastine (ASTELIN) 0.1 % nasal spray Place 1 spray into both nostrils 2 (two) times daily as needed for allergies.  12/25/19 12/24/20  [provider]  benzonatate (TESSALON) 200 MG capsule Take 200 mg by mouth 3 (three) times daily as needed for cough.    [provider]  Calcium 250 MG CAPS Take 250 mg by mouth daily.    [provider]  cetirizine (ZYRTEC) 10 MG tablet Take 10 mg by mouth daily as needed for allergies.     [provider]  Cholecalciferol (DIALYVITE VITAMIN D 5000) 125 MCG (5000 UT) capsule Take 5,000 Units by mouth daily.    [provider]  Coenzyme Q10 (COQ10) 200 MG CAPS Take 200 mg by mouth daily.    [provider]  fluticasone (FLONASE) 50 MCG/ACT nasal spray Place 1 spray into the nose daily as needed for allergies.     [provider]  guaiFENesin (MUCINEX) 600 MG 12 hr tablet Take 600 mg by mouth every 12 (twelve) hours as needed (congestion).     [provider]  ibuprofen (ADVIL) 200 MG tablet Take 400 mg by mouth every 6 (six) hours as needed for headache or moderate pain.    [provider]  lisinopril (ZESTRIL) 20 MG tablet Take 20 mg by mouth daily.  06/26/19   [provider]  lovastatin (MEVACOR) 20 MG tablet Take 20 mg by mouth at bedtime.    [provider]  magnesium oxide (MAG-OX) 400 MG tablet Take 400 mg by mouth every Monday, Wednesday, and Friday.    [provider]  metFORMIN (GLUCOPHAGE-XR) 500 MG 24 hr tablet Take 500 mg by mouth 2 (two) times daily.    [provider]  MOBIC 15 MG tablet Take 15 mg by mouth daily.  11/06/19   [provider]  montelukast (SINGULAIR)  10 MG tablet Take 10 mg by mouth at bedtime.  04/07/20   [provider]  Multiple Vitamin (MULTIVITAMIN) tablet Take 1 tablet by mouth daily.    [provider]  Potassium 99 MG TABS Take 99 mg by mouth daily.    [provider]  Probiotic Product (PROBIOTIC ADVANCED PO) Take 1 tablet by mouth daily.    [provider]  pseudoephedrine (SUDAFED) 30 MG tablet Take 30 mg by mouth every 4 (four) hours as needed for congestion.     [provider]  tadalafil (CIALIS) 20 MG tablet Take 1 tablet (20 mg total) by mouth daily as needed for erectile dysfunction. 02/07/21   McKenzie, Candee Furbish, MD  tamsulosin (FLOMAX) 0.4 MG CAPS capsule TAKE 1 CAPSULE BY MOUTH AT BEDTIME 02/10/21   McKenzie, Candee Furbish, MD  tamsulosin (FLOMAX) 0.4 MG CAPS capsule Take 1 capsule (0.4 mg total) by mouth daily after supper. 02/07/21   McKenzie, Candee Furbish, MD  testosterone cypionate (DEPOTESTOSTERONE CYPIONATE) 200 MG/ML injection Inject 0.6 mLs (120 mg total) into the muscle every 7 (seven) days. 02/07/21   McKenzie, Candee Furbish, MD  vitamin B-12 (CYANOCOBALAMIN) 1000 MCG tablet Take 1,000 mcg by mouth daily.    [provider]    Allergies Ciprofloxacin, Contrast media [iodinated diagnostic agents], Erythromycin, Ioxaglate, Metrizamide, Codeine, Hydrocodone-acetaminophen, and Metformin and related  Family History  Problem Relation Age of Onset   Colon cancer Father     Social History Social History   Tobacco Use   Smoking status: Never   Smokeless tobacco: Never  Vaping Use   Vaping Use: Never used  Substance Use Topics   Alcohol use: Yes    Comment: occ   Drug use: No    Review of Systems  Review of Systems  Constitutional:  Positive for fatigue. Negative for chills and fever.  HENT:  Negative for sore throat.   Respiratory:  Negative for shortness of breath.   Cardiovascular:  Negative for chest pain.  Gastrointestinal:  Positive for nausea. Negative for  abdominal pain.  Genitourinary:  Positive for dysuria, flank pain and frequency.  Musculoskeletal:  Negative for neck pain.  Skin:  Negative for rash and wound.  Allergic/Immunologic: Negative for immunocompromised state.  Neurological:  Negative for weakness and numbness.  Hematological:  Does not bruise/bleed easily.  All other systems reviewed and are negative.   ____________________________________________  PHYSICAL EXAM:      VITAL SIGNS: ED Triage Vitals  Enc Vitals Group     BP 05/22/21 0829 93/66     Pulse Rate 05/22/21 0829 (!) 104     Resp 05/22/21 0829 18     Temp 05/22/21 0829 98.6 F (37 C)  Temp Source 05/22/21 0829 Oral     SpO2 05/22/21 0829 96 %     Weight 05/22/21 0826 229 lb 15 oz (104.3 kg)     Height 05/22/21 0826 6\' 3"  (1.905 m)     Head Circumference --      Peak Flow --      Pain Score 05/22/21 0826 6     Pain Loc --      Pain Edu? --      Excl. in [place]? --      Physical Exam Vitals and nursing note reviewed.  Constitutional:      General: He is not in acute distress.    Appearance: He is well-developed.  HENT:     Head: Normocephalic and atraumatic.  Eyes:     Conjunctiva/sclera: Conjunctivae normal.  Cardiovascular:     Rate and Rhythm: Normal rate and regular rhythm.     Heart sounds: Normal heart sounds. No murmur heard.   No friction rub.  Pulmonary:     Effort: Pulmonary effort is normal. No respiratory distress.     Breath sounds: Normal breath sounds. No wheezing or rales.  Abdominal:     General: There is no distension.     Palpations: Abdomen is soft.     Tenderness: There is no abdominal tenderness (Moderate diffuse, L flank TTP. No overt CVAT. No rebound or guarding.).  Musculoskeletal:     Cervical back: Neck supple.  Skin:    General: Skin is warm.     Capillary Refill: Capillary refill takes less than 2 seconds.  Neurological:     Mental Status: He is alert and oriented to person, place, and time.     Motor: No  abnormal muscle tone.      ____________________________________________   LABS (all labs ordered are listed, but only abnormal results are displayed)  Labs Reviewed  COMPREHENSIVE METABOLIC PANEL - Abnormal; Notable for the following components:      Result Value   Glucose, Bld 172 (*)    BUN 27 (*)    Creatinine, Ser 1.93 (*)    Total Bilirubin 1.4 (*)    GFR, Estimated 40 (*)    All other components within normal limits  CBC - Abnormal; Notable for the following components:   WBC 24.0 (*)    Hemoglobin 17.8 (*)    MCHC 36.3 (*)    All other components within normal limits  URINALYSIS, COMPLETE (UACMP) WITH MICROSCOPIC - Abnormal; Notable for the following components:   Color, Urine AMBER (*)    APPearance CLOUDY (*)    Specific Gravity, Urine 1.032 (*)    Hgb urine dipstick MODERATE (*)    Bilirubin Urine SMALL (*)    Ketones, ur 5 (*)    Protein, ur >=300 (*)    Leukocytes,Ua MODERATE (*)    Bacteria, UA MANY (*)    All other components within normal limits  CULTURE, BLOOD (SINGLE)  CULTURE, BLOOD (SINGLE)  RESP PANEL BY RT-PCR (FLU A&B, COVID) [id number]  LACTIC ACID, PLASMA  LACTIC ACID, PLASMA    ____________________________________________  EKG:  ________________________________________  RADIOLOGY All imaging, including plain films, CT scans, and ultrasounds, independently reviewed by me, and interpretations confirmed via formal radiology reads.  ED MD interpretation:   CT stone: Left-sided nephrolithiasis, no obstructive uropathy  Official radiology report(s): CT Renal Stone Study  Result Date: 05/22/2021 CLINICAL DATA:  Bilateral flank pain.  Dysuria. EXAM: CT ABDOMEN AND PELVIS WITHOUT CONTRAST TECHNIQUE: Multidetector CT imaging of  the abdomen and pelvis was performed following the standard protocol without IV contrast. COMPARISON:  01/13/2021 FINDINGS: Lower chest: Clear lung bases. Normal heart size without pericardial or pleural effusion.  Hepatobiliary: Moderate hepatic steatosis. Normal gallbladder, without biliary ductal dilatation. Pancreas: Normal, without mass or ductal dilatation. Spleen: Normal in size, without focal abnormality. Adrenals/Urinary Tract: Normal adrenal glands. Punctate interpolar left renal collecting system calculus. No right renal calculi. No hydronephrosis. No hydroureter or ureteric calculi. No bladder calculi. Stomach/Bowel: Small bowel malrotation again identified. Normal position of the ileocecal junction. Appendix is not visualized but there is no evidence of right lower quadrant inflammation. Otherwise normal small bowel. Vascular/Lymphatic: Aortic atherosclerosis. No abdominopelvic adenopathy. Reproductive: Mild prostatomegaly. Other: No significant free fluid.  No free intraperitoneal air. 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 Lower thoracic and less so upper lumbar spondylosis. IMPRESSION: 1. Left nephrolithiasis. No obstructive uropathy. 2. Hepatic steatosis. 3. Small bowel malrotation, as before. 4. Prostatomegaly. 5. Aortic Atherosclerosis (ICD10-I70.0). Electronically Signed   By: Abigail Miyamoto M.D.   On: 05/22/2021 10:35    ____________________________________________  PROCEDURES   Procedure(s) performed (including Critical Care):  .Critical Care Performed by: Duffy Bruce, MD Authorized by: Duffy Bruce, MD   Critical care provider statement:    Critical care time (minutes):  35   Critical care time was exclusive of:  Separately billable procedures and treating other patients and teaching time   Critical care was necessary to treat or prevent imminent or life-threatening deterioration of the following conditions:  Cardiac failure, circulatory failure, respiratory failure and sepsis   Critical care was time spent personally by me on the following activities:  Development of treatment plan with patient or surrogate, discussions with consultants,  evaluation of patient's response to treatment, examination of patient, obtaining history from patient or surrogate, ordering and performing treatments and interventions, ordering and review of laboratory studies, ordering and review of radiographic studies, pulse oximetry, re-evaluation of patient's condition and review of old charts   I assumed direction of critical care for this patient from another provider in my specialty: no    ____________________________________________  INITIAL IMPRESSION / MDM / [place] / ED COURSE  As part of my medical decision making, I reviewed the following data within the [place] notes reviewed and incorporated, Old chart reviewed, Notes from prior ED visits, and [place] Controlled Substance Database       *TAYVIN PRESLAR was evaluated in Emergency Department on 05/22/2021 for the symptoms described in the history of present illness. He was evaluated in the context of the global COVID-19 pandemic, which necessitated consideration that the patient might be at risk for infection with the SARS-CoV-2 virus that causes COVID-19. Institutional protocols and algorithms that pertain to the evaluation of patients at risk for COVID-19 are in a state of rapid change based on information released by regulatory bodies including the CDC and federal and state organizations. These policies and algorithms were followed during the patient's care in the ED.  Some ED evaluations and interventions may be delayed as a result of limited staffing during the pandemic.*     Medical Decision Making: 60 year old male here with acute onset chills, fever, left-sided flank pain with nausea.  On arrival, patient tachycardic, hypotensive, with leukocytosis of 24 [place] and AKI with BUN and creatinine elevation of 27 and 1.93, respectively.  UA concerning for UTI with many bacteria and greater than 50 white blood cells.  Given the acuity of his symptoms,  CT stone  obtained and does show left-sided nephrolithiasis but this does not appear to be obstructing.  Discussed with Dr. Caprice Beaver who does not feel this needs intervention at this time.  Will cover empirically with IV Rocephin and fluids.  Given sepsis physiology with persistent mild tachycardia and borderline blood pressures, will admit for observation.  Patient updated and in agreement.  Cultures been sent.  ____________________________________________  FINAL CLINICAL IMPRESSION(S) / ED DIAGNOSES  Final diagnoses:  Sepsis secondary to UTI [facility])     MEDICATIONS GIVEN DURING THIS VISIT:  Medications  cefTRIAXone (ROCEPHIN) 2 g in sodium chloride 0.9 % 100 mL IVPB (0 g Intravenous Stopped 05/22/21 1102)  lactated ringers bolus 2,000 mL (0 mLs Intravenous Stopped 05/22/21 1102)  ondansetron (ZOFRAN) injection 4 mg (4 mg Intravenous Given 05/22/21 0936)  HYDROmorphone (DILAUDID) injection 1 mg (1 mg Intravenous Given 05/22/21 0936)     ED Discharge Orders     None        Note:  This document was prepared using Dragon voice recognition software and may include unintentional dictation errors.   Duffy Bruce, MD 05/22/21 1125

## 2021-05-23 DIAGNOSIS — J309 Allergic rhinitis, unspecified: Secondary | ICD-10-CM

## 2021-05-23 DIAGNOSIS — I10 Essential (primary) hypertension: Secondary | ICD-10-CM

## 2021-05-23 DIAGNOSIS — R000 Tachycardia, unspecified: Secondary | ICD-10-CM

## 2021-05-23 DIAGNOSIS — E119 Type 2 diabetes mellitus without complications: Secondary | ICD-10-CM

## 2021-05-23 DIAGNOSIS — N400 Enlarged prostate without lower urinary tract symptoms: Secondary | ICD-10-CM | POA: Diagnosis not present

## 2021-05-23 DIAGNOSIS — N179 Acute kidney failure, unspecified: Secondary | ICD-10-CM

## 2021-05-23 DIAGNOSIS — A419 Sepsis, unspecified organism: Secondary | ICD-10-CM | POA: Diagnosis not present

## 2021-05-23 DIAGNOSIS — G4730 Sleep apnea, unspecified: Secondary | ICD-10-CM

## 2021-05-23 LAB — BASIC METABOLIC PANEL
Anion gap: 8 (ref 5–15)
BUN: 19 mg/dL (ref 6–20)
CO2: 27 mmol/L (ref 22–32)
Calcium: 8.1 mg/dL — ABNORMAL LOW (ref 8.9–10.3)
Chloride: 100 mmol/L (ref 98–111)
Creatinine, Ser: 1.31 mg/dL — ABNORMAL HIGH (ref 0.61–1.24)
GFR, Estimated: >60 mL/min (ref >60)
Glucose, Bld: 144 mg/dL — ABNORMAL HIGH (ref 70–99)
Potassium: 3.6 mmol/L (ref 3.5–5.1)
Sodium: 135 mmol/L (ref 135–145)

## 2021-05-23 LAB — PROTIME-INR
INR: 1.3 — ABNORMAL HIGH (ref 0.8–1.2)
Prothrombin Time: 15.7 seconds — ABNORMAL HIGH (ref 11.4–15.2)

## 2021-05-23 LAB — MAGNESIUM: Magnesium: 2 mg/dL (ref 1.7–2.4)

## 2021-05-23 LAB — GLUCOSE, CAPILLARY
Glucose-Capillary: 108 mg/dL — ABNORMAL HIGH (ref 70–99)
Glucose-Capillary: 178 mg/dL — ABNORMAL HIGH (ref 70–99)
Glucose-Capillary: 126 mg/dL — ABNORMAL HIGH (ref 70–99)
Glucose-Capillary: 181 mg/dL — ABNORMAL HIGH (ref 70–99)

## 2021-05-23 LAB — CORTISOL-AM, BLOOD: Cortisol - AM: 10 ug/dL (ref 6.7–22.6)

## 2021-05-23 LAB — HEMOGLOBIN A1C
Hgb A1c MFr Bld: 6.4 % — ABNORMAL HIGH (ref 4.8–5.6)
Mean Plasma Glucose: 137 mg/dL

## 2021-05-23 LAB — PROCALCITONIN: Procalcitonin: 2.4 ng/mL

## 2021-05-23 MED ORDER — FENTANYL CITRATE PF 50 MCG/ML IJ SOSY
12.5000 ug | PREFILLED_SYRINGE | Freq: Once | INTRAMUSCULAR | Status: AC
  Administered 2021-05-23: 12.5 ug via INTRAVENOUS
  Filled 2021-05-23: qty 1

## 2021-05-23 MED ORDER — TESTOSTERONE CYPIONATE 200 MG/ML IM SOLN
200.0000 mg | INTRAMUSCULAR | Status: DC
  Administered 2021-05-23: 200 mg via INTRAMUSCULAR
  Filled 2021-05-23: qty 1

## 2021-05-23 MED ORDER — PHENAZOPYRIDINE HCL 100 MG PO TABS
100.0000 mg | ORAL_TABLET | Freq: Three times a day (TID) | ORAL | Status: DC
  Administered 2021-05-23 – 2021-05-26 (×8): 100 mg via ORAL
  Filled 2021-05-23 (×10): qty 1

## 2021-05-23 MED ORDER — OXYCODONE HCL 5 MG PO TABS
5.0000 mg | ORAL_TABLET | Freq: Four times a day (QID) | ORAL | Status: DC | PRN
  Administered 2021-05-23 – 2021-05-25 (×4): 5 mg via ORAL
  Filled 2021-05-23 (×4): qty 1

## 2021-05-23 NOTE — Assessment & Plan Note (Signed)
-   Continue Flomax 

## 2021-05-23 NOTE — Hospital Course (Addendum)
Mr. Temme is a 60 y.o. M with DM, HTN, OSA, GERD and hx nephrolithiasis who presented from PCP due to cloudy urine, low back for 1 day, with chills, dizziness, and tachycardia, hypotension.  In the ER, BP 93/66, HR 104, WBC elevated, Cr up to 1.93 from baseline 1.0.  CT renal showed left nephrolithiasis without obsruction.     9/29: Admitted and started on fluids and antibiotics for UTI sepsis.  10/1: Tachycardic and febrile overnight, resolved to baseline 10/2: Still dysuria, still fevering overnight 10/3: Fever curve improving, still chills/rigors 10/4: CT shows prostatitis, abscess, Urology consulted

## 2021-05-23 NOTE — Assessment & Plan Note (Addendum)
Creatinine 1.93 on admission up from baseline 1.0.  Improved to 1.3 this morning. - Hold lisinopril, Mobic - IV fluids

## 2021-05-23 NOTE — Assessment & Plan Note (Signed)
-   CPAP at night °

## 2021-05-23 NOTE — Assessment & Plan Note (Signed)
-   Continue montelukast  -Hold cetirizine

## 2021-05-23 NOTE — Progress Notes (Signed)
  Progress Note    ATSUSHI YOM   KDX:[id number]  DOB: 29-Jul-1961  DOA: 05/22/2021     1 Date of Service: 05/23/2021    Brief summary: Mr. Ardizzone is a 60 y.o. M with DM, HTN, OSA, GERD and hx nephrolithiasis who presented from PCP due to cloudy urine, low back for 1 day, with chills, dizziness, and tachycardia, hypotension.  In the ER, BP 93/66, HR 104, WBC elevated, Cr up to 1.93 from baseline 1.0.  CT renal showed left nephrolithiasis without obsruction.     9/29: Admitted and started on fluids and antibiotics for UTI sepsis.       Subjective:  Fever overnight, still with dysuria and tachycardia.  No confusion, chest pain, dyspnea, swelling.  Hospital Problems * Severe sepsis secondary to UTI [facility]) Presented with tachypnea, hypotension, leukocytosis, qSOFA 2.  Also acute kidney injury. Renal CT shows nephrolithiasis without obstruction. - Continue Rocephin - Follow blood and urine cultures  AKI (acute kidney injury) ([place]) Creatinine 1.93 on admission up from baseline 1.0.  Improved to 1.3 this morning. - Hold lisinopril, Mobic - IV fluids  Essential hypertension Blood pressure soft on admission -Hold home lisinopril  Diabetes mellitus without complication (HCC) Z7Q 7.3% at baseline. - Hold metformin - Continue sliding scale corrections - Continue pravastatin, aspirin  Allergic rhinitis - Continue montelukast  -Hold cetirizine  Sleep apnea - CPAP at night  BPH (benign prostatic hyperplasia) - Continue Flomax     Objective Vital signs were reviewed and unremarkable except for: Pulse: Still elevated  Vitals:   05/23/21 0805 05/23/21 1158  BP: 130/85 114/74  Pulse: (!) 110 99  Resp: 18 18  Temp: 100.3 F (37.9 C)   SpO2: 97% 94%       Exam General appearance: Adult male, lying in bed, no acute distress     HEENT: Anicteric, conjunctival pink, lids and lashes normal.  No nasal deformity, discharge, or epistaxis. Skin: Good capillary refill,  skin warm and dry, no mottling Cardiac: Tachycardic, regular, no murmurs, no lower extremity edema, no JVD Respiratory: Normal respiratory rate and rhythm, lungs clear without rales or wheezes. Abdomen: Abdomen soft without tenderness palpation or guarding, no ascites or distention, no CVA tenderness bilaterally MSK: Muscle bulk and tone, no deformities or effusions of the large joints of the upper and lower extremities bilaterally Neuro: Awake and alert, extraocular movements intact, face symmetric, speech fluent, moves upper extremities with generalized weakness but normal coordination Psych: Tension normal, affect appropriate, judgment insight appear normal.    Labs / Other Information My review of labs, imaging, notes and other tests is significant for Creatinine improving to 1.3, CT without obstruction     Time spent: 5 minutes [facility] 05/23/2021, 3:27 PM

## 2021-05-23 NOTE — Assessment & Plan Note (Signed)
Presented with tachypnea, hypotension, leukocytosis, qSOFA 2.  Also acute kidney injury. Renal CT shows nephrolithiasis without obstruction. - Continue Rocephin - Follow blood and urine cultures

## 2021-05-23 NOTE — Assessment & Plan Note (Signed)
Blood pressure soft on admission -Hold home lisinopril

## 2021-05-23 NOTE — Assessment & Plan Note (Signed)
A1c 6.4% at baseline. - Hold metformin - Continue sliding scale corrections - Continue pravastatin, aspirin

## 2021-05-24 ENCOUNTER — Inpatient Hospital Stay: Payer: BC Managed Care – PPO

## 2021-05-24 ENCOUNTER — Encounter: Payer: Self-pay | Admitting: Internal Medicine

## 2021-05-24 DIAGNOSIS — A419 Sepsis, unspecified organism: Secondary | ICD-10-CM | POA: Diagnosis not present

## 2021-05-24 DIAGNOSIS — N400 Enlarged prostate without lower urinary tract symptoms: Secondary | ICD-10-CM | POA: Diagnosis not present

## 2021-05-24 DIAGNOSIS — J309 Allergic rhinitis, unspecified: Secondary | ICD-10-CM | POA: Diagnosis not present

## 2021-05-24 DIAGNOSIS — R000 Tachycardia, unspecified: Secondary | ICD-10-CM | POA: Diagnosis present

## 2021-05-24 DIAGNOSIS — N179 Acute kidney failure, unspecified: Secondary | ICD-10-CM | POA: Diagnosis not present

## 2021-05-24 LAB — GLUCOSE, CAPILLARY
Glucose-Capillary: 182 mg/dL — ABNORMAL HIGH (ref 70–99)
Glucose-Capillary: 104 mg/dL — ABNORMAL HIGH (ref 70–99)
Glucose-Capillary: 126 mg/dL — ABNORMAL HIGH (ref 70–99)
Glucose-Capillary: 127 mg/dL — ABNORMAL HIGH (ref 70–99)

## 2021-05-24 LAB — COMPREHENSIVE METABOLIC PANEL
ALT: 28 U/L (ref 0–44)
AST: 26 U/L (ref 15–41)
Albumin: 3.5 g/dL (ref 3.5–5.0)
Alkaline Phosphatase: 51 U/L (ref 38–126)
Anion gap: 8 (ref 5–15)
BUN: 13 mg/dL (ref 6–20)
CO2: 30 mmol/L (ref 22–32)
Calcium: 8.5 mg/dL — ABNORMAL LOW (ref 8.9–10.3)
Chloride: 100 mmol/L (ref 98–111)
Creatinine, Ser: 1.29 mg/dL — ABNORMAL HIGH (ref 0.61–1.24)
GFR, Estimated: >60 mL/min (ref >60)
Glucose, Bld: 111 mg/dL — ABNORMAL HIGH (ref 70–99)
Potassium: 4 mmol/L (ref 3.5–5.1)
Sodium: 138 mmol/L (ref 135–145)
Total Bilirubin: 1.1 mg/dL (ref 0.3–1.2)
Total Protein: 6.2 g/dL — ABNORMAL LOW (ref 6.5–8.1)

## 2021-05-24 LAB — CBC
HCT: 42.7 % (ref 39.0–52.0)
Hemoglobin: 15.1 g/dL (ref 13.0–17.0)
MCH: 31.9 pg (ref 26.0–34.0)
MCHC: 35.4 g/dL (ref 30.0–36.0)
MCV: 90.3 fL (ref 80.0–100.0)
Platelets: 111 10*3/uL — ABNORMAL LOW (ref 150–400)
RBC: 4.73 MIL/uL (ref 4.22–5.81)
RDW: 13 % (ref 11.5–15.5)
WBC: 6.5 10*3/uL (ref 4.0–10.5)
nRBC: 0 % (ref 0.0–0.2)

## 2021-05-24 LAB — TSH: TSH: 1.331 u[IU]/mL (ref 0.350–4.500)

## 2021-05-24 LAB — D-DIMER, QUANTITATIVE: D-Dimer, Quant: 2.02 ug/mL-FEU — ABNORMAL HIGH (ref 0.00–0.50)

## 2021-05-24 NOTE — Assessment & Plan Note (Signed)
-   CPAP at night °

## 2021-05-24 NOTE — Progress Notes (Signed)
  Progress Note    AZAREL BANNER   ZOX:[id number]  DOB: April 07, 1961  DOA: 05/22/2021     2 Date of Service: 05/24/2021    Brief summary: [name] is a 60 y.o. M with DM, HTN, OSA, GERD and hx nephrolithiasis who presented from PCP due to cloudy urine, low back for 1 day, with chills, dizziness, and tachycardia, hypotension.  In the ER, BP 93/66, HR 104, WBC elevated, Cr up to 1.93 from baseline 1.0.  CT renal showed left nephrolithiasis without obsruction.     9/29: Admitted and started on fluids and antibiotics for UTI sepsis.  10/1: Tachycardic and febrile overnight, resolved to baseline    Subjective:  Tachycardic, febrile overnight, no dyspnea, no chest pain.  No swelling.  No confusion.  Hospital Problems * Sepsis secondary to UTI [facility]) Presented with tachypnea, hypotension, leukocytosis, qSOFA 2.  Also acute kidney injury. Renal CT shows nephrolithiasis without obstruction.  Unfortunately, urine culture not collected on admission. - Continue Rocephin - Follow blood cultures - Urine culture collected  AKI (acute kidney injury) ([place]) Creatinine 1.93 on admission up from baseline 1.0.  Improved to normal - Hold lisinopril, Mobic   Sinus tachycardia Chronic, baseline is 100 bpm.  Last night tachycardic up to 160, sinus. Resolved this morning.  Essential hypertension Blood pressure normal CXR overnight showed some interstitial changes, but he has no respiratory symptoms, and givne his AKI, I will stop fluids but hold off on diuresis itself.   -Hold home lisinopril  Diabetes mellitus without complication (HCC) W1X 9.1% at baseline. Glucose normal. - Hold metformin - Continue sliding scale corrections - Continue pravastatin, aspirin  Allergic rhinitis - Continue montelukast  -Hold cetirizine  Sleep apnea - CPAP at night  BPH (benign prostatic hyperplasia) - Continue Flomax     Objective Vital signs were reviewed and unremarkable except for: Pulse:  Tachycardic  and Temperature: Febrile BP (!) 133/92 (BP Location: Left Arm)   Pulse 88   Temp 98.2 F (36.8 C) (Oral)   Resp 18   Ht 6\' 3"  (1.905 m)   Wt 104.3 kg   SpO2 98%   BMI 28.74 kg/m       Exam General appearance: Adult male, lying in bed, no acute distress     HEENT:    Skin:  Cardiac: Tachycardic, regular, no murmurs, no lower extremity edema Respiratory: Normal respiratory rate and rhythm, clear without rales or wheezes Abdomen: Abdomen soft tenderness palpation or guarding, no ascites or distention  MSK:  Neuro: Awake and alert, extraocular movements intact, face symmetric, speech fluent, upper extremity strength symmetric Psych: Attention normal, affect normal, judgment insight appear normal    Labs / Other Information My review of labs, imaging, notes and other tests is significant for Chest x-ray with interstitial change, hypoxia resolved, urine culture not done, creatinine 1.2, almost back to normal     Time spent: 25 minutes [facility] 05/24/2021, 3:33 PM

## 2021-05-24 NOTE — Assessment & Plan Note (Addendum)
Presented with tachypnea, hypotension, leukocytosis, qSOFA 2.  Also acute kidney injury. Renal CT shows nephrolithiasis without obstruction.  Unfortunately, urine culture not collected on admission. - Continue Rocephin - Follow blood cultures - Urine culture collected

## 2021-05-24 NOTE — Progress Notes (Signed)
   05/23/21 0046  Assess: MEWS Score  Temp (!) 101.5 F (38.6 C)  BP 118/73  Pulse Rate (!) 121  Resp 20  Level of Consciousness Alert  SpO2 93 %  O2 Device Room Air  Assess: MEWS Score  MEWS Temp 2  MEWS Systolic 0  MEWS Pulse 2  MEWS RR 0  MEWS LOC 0  MEWS Score 4  MEWS Score Color Red  Assess: if the MEWS score is Yellow or Red  Were vital signs taken at a resting state? Yes  Focused Assessment No change from prior assessment  Does the patient meet 2 or more of the SIRS criteria? Yes  Does the patient have a confirmed or suspected source of infection? Yes  Provider and Rapid Response Notified? Yes (Provider)  MEWS guidelines implemented *See Row Information* Yes  Treat  MEWS Interventions Escalated (See documentation below)  Pain Scale 0-10  Pain Score 4  Pain Type Acute pain  Pain Location Generalized  Pain Descriptors / Indicators Aching  Take Vital Signs  Increase Vital Sign Frequency  Red: Q 1hr X 4 then Q 4hr X 4, if remains red, continue Q 4hrs  Escalate  MEWS: Escalate Red: discuss with charge nurse/RN and provider, consider discussing with RRT  Notify: Charge Nurse/RN  Name of Charge Nurse/RN Notified Phoebe Sharps, RN  Date Charge Nurse/RN Notified 05/23/21  Time Charge Nurse/RN Notified 0100  Notify: Provider  Provider Name/Title Sharion Settler NP  Date Provider Notified 05/23/21  Time Provider Notified [phone]  Notification Type Page (Text page)  Notification Reason Other (Comment) (increase in heart rate)  Provider response Evaluate remotely  Date of Provider Response 05/23/21  Time of Provider Response 0050  Assess: SIRS CRITERIA  SIRS Temperature  1  SIRS Pulse 1  SIRS Respirations  0  SIRS WBC 0  SIRS Score Sum  2

## 2021-05-24 NOTE — Assessment & Plan Note (Signed)
Chronic, baseline is 100 bpm.  Last night tachycardic up to 160, sinus. Resolved this morning.

## 2021-05-24 NOTE — Assessment & Plan Note (Addendum)
Blood pressure normal CXR overnight showed some interstitial changes, but he has no respiratory symptoms, and givne his AKI, I will stop fluids but hold off on diuresis itself.   -Hold home lisinopril

## 2021-05-24 NOTE — Assessment & Plan Note (Signed)
Creatinine 1.93 on admission up from baseline 1.0.  Improved to normal - Hold lisinopril, Mobic

## 2021-05-24 NOTE — Assessment & Plan Note (Signed)
A1c 6.4% at baseline. Glucose normal. - Hold metformin - Continue sliding scale corrections - Continue pravastatin, aspirin

## 2021-05-24 NOTE — Assessment & Plan Note (Signed)
-   Continue montelukast  -Hold cetirizine

## 2021-05-24 NOTE — Assessment & Plan Note (Signed)
-   Continue Flomax 

## 2021-05-25 DIAGNOSIS — J309 Allergic rhinitis, unspecified: Secondary | ICD-10-CM | POA: Diagnosis not present

## 2021-05-25 DIAGNOSIS — A419 Sepsis, unspecified organism: Secondary | ICD-10-CM | POA: Diagnosis not present

## 2021-05-25 DIAGNOSIS — N400 Enlarged prostate without lower urinary tract symptoms: Secondary | ICD-10-CM | POA: Diagnosis not present

## 2021-05-25 DIAGNOSIS — N179 Acute kidney failure, unspecified: Secondary | ICD-10-CM | POA: Diagnosis not present

## 2021-05-25 LAB — BASIC METABOLIC PANEL
Anion gap: 13 (ref 5–15)
BUN: 16 mg/dL (ref 6–20)
CO2: 29 mmol/L (ref 22–32)
Calcium: 9.2 mg/dL (ref 8.9–10.3)
Chloride: 97 mmol/L — ABNORMAL LOW (ref 98–111)
Creatinine, Ser: 1.26 mg/dL — ABNORMAL HIGH (ref 0.61–1.24)
GFR, Estimated: >60 mL/min (ref >60)
Glucose, Bld: 144 mg/dL — ABNORMAL HIGH (ref 70–99)
Potassium: 4.3 mmol/L (ref 3.5–5.1)
Sodium: 139 mmol/L (ref 135–145)

## 2021-05-25 LAB — URINE CULTURE: Culture: NO GROWTH

## 2021-05-25 LAB — CBC
HCT: 47.7 % (ref 39.0–52.0)
Hemoglobin: 16.7 g/dL (ref 13.0–17.0)
MCH: 31.6 pg (ref 26.0–34.0)
MCHC: 35 g/dL (ref 30.0–36.0)
MCV: 90.2 fL (ref 80.0–100.0)
Platelets: 129 10*3/uL — ABNORMAL LOW (ref 150–400)
RBC: 5.29 MIL/uL (ref 4.22–5.81)
RDW: 12.8 % (ref 11.5–15.5)
WBC: 5 10*3/uL (ref 4.0–10.5)
nRBC: 0 % (ref 0.0–0.2)

## 2021-05-25 LAB — GLUCOSE, CAPILLARY
Glucose-Capillary: 162 mg/dL — ABNORMAL HIGH (ref 70–99)
Glucose-Capillary: 126 mg/dL — ABNORMAL HIGH (ref 70–99)
Glucose-Capillary: 160 mg/dL — ABNORMAL HIGH (ref 70–99)
Glucose-Capillary: 135 mg/dL — ABNORMAL HIGH (ref 70–99)

## 2021-05-25 MED ORDER — SENNA 8.6 MG PO TABS
1.0000 | ORAL_TABLET | Freq: Every day | ORAL | Status: DC | PRN
  Administered 2021-05-25: 8.6 mg via ORAL
  Filled 2021-05-25: qty 1

## 2021-05-25 NOTE — Assessment & Plan Note (Signed)
Chronic, baseline is 100 bpm.

## 2021-05-25 NOTE — Assessment & Plan Note (Signed)
-   CPAP at night °

## 2021-05-25 NOTE — Assessment & Plan Note (Signed)
A1c 6.4% at baseline. Glucose here mostly normal. - Hold metformin - Continue sliding scale corrections - Continue pravastatin, aspirin

## 2021-05-25 NOTE — Assessment & Plan Note (Signed)
Presented with tachypnea, hypotension, leukocytosis, qSOFA 2.  Also acute kidney injury. Renal CT showed punctate tiny nephrolithiasis without obstruction nor abscess.  Unfortunately, urine culture not collected on admission.  Still fevering, still with dysuria and urinary urgency/frequency, starting to wonder if this is enterococcus or resistant.  Still overall appears nontoxic. - Continue Rocephin - Follow blood and new urine cultures  - If urine culture without growth, will scan C/A/P, get COVID repeat, probably consult ID

## 2021-05-25 NOTE — Progress Notes (Signed)
  Progress Note    [name]   [id number]  DOB: 01-Oct-1960  DOA: 05/22/2021     3 Date of Service: 05/25/2021    Brief summary: [name] is a 60 y.o. M with DM, HTN, OSA, GERD and hx nephrolithiasis who presented from PCP due to cloudy urine, low back for 1 day, with chills, dizziness, and tachycardia, hypotension.  In the ER, BP 93/66, HR 104, WBC elevated, Cr up to 1.93 from baseline 1.0.  CT renal showed left nephrolithiasis without obsruction.     9/29: Admitted and started on fluids and antibiotics for UTI sepsis.  10/1: Tachycardic and febrile overnight, resolved to baseline 10/2: Still dysuria, still fevering overnight     Subjective:  Fever again last night.  Still with dysuria, urinary frequency, urinary urgency.  No flank pain.  No hematuria.  No confusion.  No hypertension.  No chest pain dyspnea.  No cough, coryza, sinus congestion,  Hospital Problems * Sepsis secondary to UTI [facility]) Presented with tachypnea, hypotension, leukocytosis, qSOFA 2.  Also acute kidney injury. Renal CT showed punctate tiny nephrolithiasis without obstruction nor abscess.  Unfortunately, urine culture not collected on admission.  Still fevering, still with dysuria and urinary urgency/frequency, starting to wonder if this is enterococcus or resistant.  Still overall appears nontoxic. - Continue Rocephin - Follow blood and new urine cultures  - If urine culture without growth, will scan C/A/P, get COVID repeat, probably consult ID  AKI (acute kidney injury) ([place]) Creatinine 1.93 on admission up from baseline 1.0.  Improving - Stop IV fluids - Push oral fluids - Trend BMP - Hold lisinopril, Mobic   Sinus tachycardia Chronic, baseline is 100 bpm.   Essential hypertension Blood pressure high normal - Hold home lisinopril  Diabetes mellitus without complication (HCC) U4Q 0.3% at baseline. Glucose here mostly normal. - Hold metformin - Continue sliding scale  corrections - Continue pravastatin, aspirin  Allergic rhinitis - Continue montelukast Flonase - Hold cetirizine  Sleep apnea - CPAP at night  BPH (benign prostatic hyperplasia) - Continue Flomax     Objective Vital signs were reviewed and unremarkable except for: Blood pressure: high normal , Pulse: tachycardic 80-100s  and Temperature: 101F overnight BP 117/77 (BP Location: Left Arm)   Pulse 92   Temp 98.7 F (37.1 C) (Oral)   Resp 18   Ht 6\' 3"  (1.905 m)   Wt 104.3 kg   SpO2 95%   BMI 28.74 kg/m       Exam General appearance: Adult male, no acute distress, sitting up in chair     HEENT:    Skin:  Cardiac: Tachycardic, regular, no murmurs, no lower extremity edema, JVD Respiratory: Normal respiratory rate and rhythm, lungs clear without rales or wheezes Abdomen: Abdomen soft, no tenderness palpation or guarding, no ascites or distention, no CVA tenderness. MSK: Normal muscle bulk and tone. Neuro: Awake alert, extraocular movements intact, moves all extremities with normal strength and coordination, speech fluent Psych: Attention normal affect appropriate, judgment insight      Labs / Other Information My review of labs, imaging, notes and other tests is significant for Stable creatinine 1.2, electrolytes otherwise normal, white blood cells normal   Wife present at the bedside.     Time spent: 25 [facility] 05/25/2021, 2:00 PM

## 2021-05-25 NOTE — Assessment & Plan Note (Signed)
Blood pressure high normal - Hold home lisinopril

## 2021-05-25 NOTE — Assessment & Plan Note (Signed)
Creatinine 1.93 on admission up from baseline 1.0.  Improving - Stop IV fluids - Push oral fluids - Trend BMP - Hold lisinopril, Mobic

## 2021-05-25 NOTE — Assessment & Plan Note (Addendum)
-   Continue montelukast Flonase - Hold cetirizine

## 2021-05-25 NOTE — Assessment & Plan Note (Signed)
-   Continue Flomax 

## 2021-05-26 DIAGNOSIS — A419 Sepsis, unspecified organism: Secondary | ICD-10-CM | POA: Diagnosis not present

## 2021-05-26 DIAGNOSIS — N400 Enlarged prostate without lower urinary tract symptoms: Secondary | ICD-10-CM | POA: Diagnosis not present

## 2021-05-26 DIAGNOSIS — J309 Allergic rhinitis, unspecified: Secondary | ICD-10-CM | POA: Diagnosis not present

## 2021-05-26 DIAGNOSIS — N179 Acute kidney failure, unspecified: Secondary | ICD-10-CM | POA: Diagnosis not present

## 2021-05-26 LAB — CBC
HCT: 46.7 % (ref 39.0–52.0)
Hemoglobin: 16.8 g/dL (ref 13.0–17.0)
MCH: 32 pg (ref 26.0–34.0)
MCHC: 36 g/dL (ref 30.0–36.0)
MCV: 89 fL (ref 80.0–100.0)
Platelets: 129 10*3/uL — ABNORMAL LOW (ref 150–400)
RBC: 5.25 MIL/uL (ref 4.22–5.81)
RDW: 12.9 % (ref 11.5–15.5)
WBC: 4.3 10*3/uL (ref 4.0–10.5)
nRBC: 0 % (ref 0.0–0.2)

## 2021-05-26 LAB — URINALYSIS, COMPLETE (UACMP) WITH MICROSCOPIC
Glucose, UA: NEGATIVE mg/dL
Ketones, ur: 5 mg/dL — AB
Nitrite: NEGATIVE
Protein, ur: >=300 mg/dL — AB
Specific Gravity, Urine: 1.032 — ABNORMAL HIGH (ref 1.005–1.030)
WBC, UA: >50 WBC/hpf (ref 0–5)
pH: 5 (ref 5.0–8.0)

## 2021-05-26 LAB — GLUCOSE, CAPILLARY
Glucose-Capillary: 161 mg/dL — ABNORMAL HIGH (ref 70–99)
Glucose-Capillary: 114 mg/dL — ABNORMAL HIGH (ref 70–99)
Glucose-Capillary: 140 mg/dL — ABNORMAL HIGH (ref 70–99)
Glucose-Capillary: 184 mg/dL — ABNORMAL HIGH (ref 70–99)

## 2021-05-26 LAB — BASIC METABOLIC PANEL
Anion gap: 8 (ref 5–15)
BUN: 16 mg/dL (ref 6–20)
CO2: 31 mmol/L (ref 22–32)
Calcium: 8.8 mg/dL — ABNORMAL LOW (ref 8.9–10.3)
Chloride: 98 mmol/L (ref 98–111)
Creatinine, Ser: 1.06 mg/dL (ref 0.61–1.24)
GFR, Estimated: >60 mL/min (ref >60)
Glucose, Bld: 122 mg/dL — ABNORMAL HIGH (ref 70–99)
Potassium: 4 mmol/L (ref 3.5–5.1)
Sodium: 137 mmol/L (ref 135–145)

## 2021-05-26 MED ORDER — PREDNISONE 20 MG PO TABS
50.0000 mg | ORAL_TABLET | Freq: Four times a day (QID) | ORAL | Status: AC
Start: 2021-05-26 — End: 2021-05-27
  Administered 2021-05-26 – 2021-05-27 (×3): 50 mg via ORAL
  Filled 2021-05-26 (×3): qty 3

## 2021-05-26 MED ORDER — DIPHENHYDRAMINE HCL 50 MG/ML IJ SOLN
50.0000 mg | Freq: Once | INTRAMUSCULAR | Status: AC
  Filled 2021-05-26: qty 1

## 2021-05-26 MED ORDER — DIPHENHYDRAMINE HCL 25 MG PO CAPS
50.0000 mg | ORAL_CAPSULE | Freq: Once | ORAL | Status: AC
  Administered 2021-05-27: 50 mg via ORAL
  Filled 2021-05-26: qty 2

## 2021-05-26 NOTE — Assessment & Plan Note (Addendum)
-   Continue Flomax - Check PVR

## 2021-05-26 NOTE — Progress Notes (Signed)
  Progress Note    [name]   [id number]  DOB: October 27, 1960  DOA: 05/22/2021     4 Date of Service: 05/26/2021     Brief summary: [name] is a 60 y.o. M with DM, HTN, OSA, GERD and hx nephrolithiasis who presented from PCP due to cloudy urine, low back for 1 day, with chills, dizziness, and tachycardia, hypotension.  In the ER, BP 93/66, HR 104, WBC elevated, Cr up to 1.93 from baseline 1.0.  CT renal showed left nephrolithiasis without obsruction.     9/29: Admitted and started on fluids and antibiotics for UTI sepsis.  10/1: Tachycardic and febrile overnight, resolved to baseline 10/2: Still dysuria, still fevering overnight 10/3: Fever curve improving, still chills/rigors       Subjective:  Fever curve is improving, but still with chills, rigors.  Dysuria is improving, but still present.  No flank pain, prostate pain.  No confusion.  No chest pain.  Hospital Problems * Sepsis secondary to UTI [facility]) Presented with tachypnea, hypotension, leukocytosis, qSOFA 2.  Also acute kidney injury. Renal CT showed punctate tiny nephrolithiasis without obstruction nor abscess.  Unfortunately, urine culture not collected on admission.  Urine culture no growth.  Fever curve improving slowly.  Discussed with ID by phone.  Given slowness, ?prostatitis or abscess.  Or urinary retention.   - Continue Rocephin  - Check post-void residual - Obtain CT with contrast (needs steroid prep)   AKI (acute kidney injury) ([place]) Creatinine 1.93 on admission, now resolved to baseline  - Hold lisinopril, Mobic   Sinus tachycardia Chronic, baseline is 100 bpm.   Essential hypertension Blood pressure normal - Hold home lisinopril  Diabetes mellitus without complication (HCC) Q3E 0.9% at baseline. Glucose here mostly normal. - Hold metformin - Continue sliding scale corrections - Continue pravastatin  Allergic rhinitis - Continue montelukast Flonase - Hold cetirizine  Sleep  apnea - CPAP at night  BPH (benign prostatic hyperplasia) - Continue Flomax - Check PVR     Objective Vital signs were reviewed and unremarkable except for: Pulse: 100.  BP 126/82 (BP Location: Left Arm)   Pulse 87   Temp (!) 97.3 F (36.3 C) (Oral)   Resp 18   Ht 6\' 3"  (1.905 m)   Wt 104.3 kg   SpO2 95%   BMI 28.74 kg/m      Exam General appearance: Adult male, Sitting up in the recliner, no acute distress, appears conversational. HEENT:    Skin:  Cardiac: Tachycardic, regular, No murmurs, no lower extremity edema Respiratory: Normal rate and rhythm, lungs clear without rales or wheezes Abdomen: Abdomen soft without tenderness palpation or guarding, no ascites or distention. MSK: Normal muscle bulk and tone. Neuro: Awake and alert, extraocular movements intact, moves all extremities normal strength and coordination, speech fluent Psych: Mood, affect normal, judgment insight appear normal    Labs / Other Information My review of labs, imaging, notes and other tests is significant for Urine culture with no growth, glucose is normal, white blood cell count down to normal, creatinine down to 1.09.     Time spent: 25 minutes [facility] 05/26/2021, 7:50 PM

## 2021-05-26 NOTE — Assessment & Plan Note (Signed)
-   CPAP at night °

## 2021-05-26 NOTE — Assessment & Plan Note (Signed)
Presented with tachypnea, hypotension, leukocytosis, qSOFA 2.  Also acute kidney injury. Renal CT showed punctate tiny nephrolithiasis without obstruction nor abscess.  Unfortunately, urine culture not collected on admission.  Urine culture no growth.  Fever curve improving slowly.  Discussed with ID by phone.  Given slowness, ?prostatitis or abscess.  Or urinary retention.   - Continue Rocephin  - Check post-void residual - Obtain CT with contrast (needs steroid prep)

## 2021-05-26 NOTE — Assessment & Plan Note (Signed)
Chronic, baseline is 100 bpm.

## 2021-05-26 NOTE — Assessment & Plan Note (Signed)
A1c 6.4% at baseline. Glucose here mostly normal. - Hold metformin - Continue sliding scale corrections - Continue pravastatin

## 2021-05-26 NOTE — Assessment & Plan Note (Signed)
-   Continue montelukast Flonase - Hold cetirizine

## 2021-05-26 NOTE — Assessment & Plan Note (Signed)
Creatinine 1.93 on admission, now resolved to baseline  - Hold lisinopril, Mobic

## 2021-05-26 NOTE — Assessment & Plan Note (Signed)
Blood pressure normal - Hold home lisinopril

## 2021-05-27 ENCOUNTER — Encounter: Payer: Self-pay | Admitting: Internal Medicine

## 2021-05-27 ENCOUNTER — Inpatient Hospital Stay: Payer: BC Managed Care – PPO

## 2021-05-27 DIAGNOSIS — A419 Sepsis, unspecified organism: Secondary | ICD-10-CM | POA: Diagnosis not present

## 2021-05-27 DIAGNOSIS — N400 Enlarged prostate without lower urinary tract symptoms: Secondary | ICD-10-CM | POA: Diagnosis not present

## 2021-05-27 DIAGNOSIS — J309 Allergic rhinitis, unspecified: Secondary | ICD-10-CM | POA: Diagnosis not present

## 2021-05-27 DIAGNOSIS — N179 Acute kidney failure, unspecified: Secondary | ICD-10-CM | POA: Diagnosis not present

## 2021-05-27 LAB — BASIC METABOLIC PANEL
Anion gap: 9 (ref 5–15)
BUN: 21 mg/dL — ABNORMAL HIGH (ref 6–20)
CO2: 28 mmol/L (ref 22–32)
Calcium: 9.4 mg/dL (ref 8.9–10.3)
Chloride: 102 mmol/L (ref 98–111)
Creatinine, Ser: 1.1 mg/dL (ref 0.61–1.24)
GFR, Estimated: >60 mL/min (ref >60)
Glucose, Bld: 203 mg/dL — ABNORMAL HIGH (ref 70–99)
Potassium: 4.1 mmol/L (ref 3.5–5.1)
Sodium: 139 mmol/L (ref 135–145)

## 2021-05-27 LAB — GLUCOSE, CAPILLARY
Glucose-Capillary: 238 mg/dL — ABNORMAL HIGH (ref 70–99)
Glucose-Capillary: 195 mg/dL — ABNORMAL HIGH (ref 70–99)
Glucose-Capillary: 224 mg/dL — ABNORMAL HIGH (ref 70–99)
Glucose-Capillary: 233 mg/dL — ABNORMAL HIGH (ref 70–99)

## 2021-05-27 LAB — CBC
HCT: 49.1 % (ref 39.0–52.0)
Hemoglobin: 17.6 g/dL — ABNORMAL HIGH (ref 13.0–17.0)
MCH: 31.7 pg (ref 26.0–34.0)
MCHC: 35.8 g/dL (ref 30.0–36.0)
MCV: 88.5 fL (ref 80.0–100.0)
Platelets: 169 10*3/uL (ref 150–400)
RBC: 5.55 MIL/uL (ref 4.22–5.81)
RDW: 12.7 % (ref 11.5–15.5)
WBC: 5.5 10*3/uL (ref 4.0–10.5)
nRBC: 0 % (ref 0.0–0.2)

## 2021-05-27 LAB — CULTURE, BLOOD (SINGLE)
Culture: NO GROWTH
Culture: NO GROWTH
Special Requests: ADEQUATE

## 2021-05-27 MED ORDER — IOHEXOL 350 MG/ML SOLN
100.0000 mL | Freq: Once | INTRAVENOUS | Status: AC | PRN
  Administered 2021-05-27: 100 mL via INTRAVENOUS

## 2021-05-27 MED ORDER — LEVOFLOXACIN IN D5W 750 MG/150ML IV SOLN
750.0000 mg | INTRAVENOUS | Status: DC
  Administered 2021-05-27: 750 mg via INTRAVENOUS
  Filled 2021-05-27 (×2): qty 150

## 2021-05-27 MED ORDER — DIPHENHYDRAMINE HCL 25 MG PO CAPS: 25.0000 mg | ORAL_CAPSULE | Freq: Four times a day (QID) | ORAL | Status: DC | PRN

## 2021-05-27 NOTE — Assessment & Plan Note (Signed)
PVR normal - Continue Flomax

## 2021-05-27 NOTE — Assessment & Plan Note (Addendum)
Presented with tachypnea, hypotension, leukocytosis, qSOFA 2.  Also acute kidney injury. Renal CT showed punctate tiny nephrolithiasis without obstruction nor abscess.  Unfortunately, urine culture not collected on admission.  Urine culture no growth.  Fever curve slower than expected to resolve.  Still very symptomatic, sweats last night, weak.    CT obtained today showed prostatitis with small 1cm abscess.  Discussed with Urology who recommend transition to Bactrim at discharge for 3 week course, follow up with primary Urologist Dr. Noah Delaine in 2-3 weeks    Of note, had prostatitis in May, treated with Bactrim and Cipro, has still felt weak and tired and malaise to this point.   - Switch to Levaquin IV for better prostate penetration - Diphenhdyramine for itching with FQ  - Given significant sweats today, will give one more day IV antibiotics, then transition to Bactrim for 3 weeks with outpatient follow up with Urology for likely discharge tomorrow

## 2021-05-27 NOTE — Progress Notes (Signed)
Mobility Specialist - Progress Note   05/27/21 1542  Mobility  Activity Ambulated in hall  Level of Assistance Independent  Assistive Device None  Distance Ambulated (ft) 380 ft  Mobility Ambulated independently in hallway  Mobility Response Tolerated well  Mobility performed by Mobility specialist  $Mobility charge 1 Mobility    Pre-mobility: 101 HR, 98% SpO2 During mobility: 114 HR, 95% SpO2   Pt ambulated in hallway independently. Max HR 114 bpm. No complaints.    Kathee Delton Mobility Specialist 05/27/21, 3:47 PM

## 2021-05-27 NOTE — Consult Note (Signed)
Asked to see [name] for possible prostatic abscess.  Admitted 05/22/2021 with chills and subjective fever associated with left low back pain, dysuria and urgency.  Urinalysis with significant pyuria.  Urine culture was not ordered until after treatment was started.  Urine culture was negative as well as blood cultures.  He has been on IV ceftriaxone.  He has mild dysuria which he states is improving.  Afebrile past 24 hours.  CT abdomen pelvis with contrast performed 05/26/2021 with a 12 mm hypodense area with enhancing rim left prostate potentially representing a small abscess.  I was asked to see regarding any surgical intervention.  He is clinically improving and afebrile past 24 hours and would recommend continuing antibiotic therapy.  Okay to switch to oral antibiotics and would use an antibiotic with good prostate penetration (Septra DS)  and treat for at least 3 weeks.  He is followed by Dr. Carro[facility] and would recommend follow-up appointment in reasonable 2-3 weeks after discharge.   [name], MD

## 2021-05-27 NOTE — Assessment & Plan Note (Signed)
Creatinine 1.93 on admission, now resolved to baseline  - Hold Mobic - Resume lisinopril at discharge

## 2021-05-27 NOTE — Assessment & Plan Note (Signed)
Chronic, baseline is 100 bpm.

## 2021-05-27 NOTE — Assessment & Plan Note (Signed)
-   Continue montelukast Flonase - Hold cetirizine

## 2021-05-27 NOTE — Assessment & Plan Note (Signed)
Blood pressure normal - Hold home lisinopril

## 2021-05-27 NOTE — Progress Notes (Signed)
Progress Note    [name]   [id number]:[id number]  DOB: 06-11-1961  DOA: 05/22/2021     5 Date of Service: 05/27/2021    Brief summary: [name] is a 60 y.o. M with DM, HTN, OSA, GERD and hx nephrolithiasis who presented from PCP due to cloudy urine, low back for 1 day, with chills, dizziness, and tachycardia, hypotension.  In the ER, BP 93/66, HR 104, WBC elevated, Cr up to 1.93 from baseline 1.0.  CT renal showed left nephrolithiasis without obsruction.     9/29: Admitted and started on fluids and antibiotics for UTI sepsis.  10/1: Tachycardic and febrile overnight, resolved to baseline 10/2: Still dysuria, still fevering overnight 10/3: Fever curve improving, still chills/rigors 10/4: CT shows prostatitis, abscess, Urology consulted        Subjective:  Bad sweats last night, chills, malasie.  But better today.  no confusion.  no fever.  No chest pain or dyspnea.  Hospital Problems * Sepsis [facility]) Presented with tachypnea, hypotension, leukocytosis, qSOFA 2.  Also acute kidney injury. Renal CT showed punctate tiny nephrolithiasis without obstruction nor abscess.  Unfortunately, urine culture not collected on admission.  Urine culture no growth.  Fever curve slower than expected to resolve.  Still very symptomatic, sweats last night, weak.    CT obtained today showed prostatitis with small 1cm abscess.  Discussed with Urology who recommend transition to Bactrim at discharge for 3 week course, follow up with primary Urologist [name] in 2-3 weeks    Of note, had prostatitis in May, treated with Bactrim and Cipro, has still felt weak and tired and malaise to this point.   - Switch to Levaquin IV for better prostate penetration - Diphenhdyramine for itching with FQ  - Given significant sweats today, will give one more day IV antibiotics, then transition to Bactrim for 3 weeks with outpatient follow up with Urology for likely discharge tomorrow  AKI (acute kidney  injury) ([place]) Creatinine 1.93 on admission, now resolved to baseline  - Hold Mobic - Resume lisinopril at discharge   Sinus tachycardia Chronic, baseline is 100 bpm.   Essential hypertension Blood pressure normal - Hold home lisinopril  Diabetes mellitus without complication (HCC) J8S 5.0% at baseline. Glucose here mostly normal. - Hold metformin - Continue sliding scale corrections - Continue pravastatin  Allergic rhinitis - Continue montelukast Flonase - Hold cetirizine  Sleep apnea - CPAP at night  BPH (benign prostatic hyperplasia) PVR normal - Continue Flomax       Objective Vital signs were reviewed and unremarkable.  Vitals:   05/27/21 0440 05/27/21 0855 05/27/21 1522 05/27/21 1911  BP: 135/87 (!) 129/92 140/84 139/88  Pulse: 94 94 97 (!) 102  Resp: 18 18 19 17   Temp: 97.7 F (36.5 C) (!) 96.7 F (35.9 C) 97.9 F (36.6 C) 97.9 F (36.6 C)  TempSrc: Oral  Oral Oral  SpO2: 98% 96% 97% 95%  Weight:      Height:       104.3 kg  Exam Physical Exam Constitutional:      General: He is not in acute distress. HENT:     Nose: No nasal deformity or rhinorrhea.     Mouth/Throat:     Lips: Pink. No lesions.     Mouth: Mucous membranes are moist. No oral lesions.     Dentition: Normal dentition.     Pharynx: Oropharynx is clear. No posterior oropharyngeal erythema.  Eyes:     General: Lids are normal.  Gaze aligned appropriately.     Extraocular Movements: Extraocular movements intact.     Conjunctiva/sclera: Conjunctivae normal.  Cardiovascular:     Rate and Rhythm: Normal rate and regular rhythm.     Pulses:          Radial pulses are 2+ on the right side and 2+ on the left side.     Heart sounds: Normal heart sounds, S1 normal and S2 normal. No murmur heard. Pulmonary:     Effort: Pulmonary effort is normal. No respiratory distress.     Breath sounds: No wheezing or rales.  Abdominal:     General: There is no distension.     Palpations: Abdomen  is soft.     Tenderness: There is no abdominal tenderness. There is no guarding or rebound.  Musculoskeletal:     Right lower leg: No edema.     Left lower leg: No edema.  Skin:    General: Skin is warm and dry.     Findings: No lesion or rash.  Neurological:     Mental Status: He is alert and oriented to person, place, and time.     Cranial Nerves: Cranial nerves are intact.     Motor: No weakness.  Psychiatric:        Attention and Perception: Attention normal.        Mood and Affect: Mood and affect normal.        Behavior: Behavior is cooperative.        Cognition and Memory: Memory normal.        Judgment: Judgment normal.       Labs / Other Information My review of labs, imaging, notes and other tests shows no new significant findings.     Time spent: 25 minutes [facility] 05/27/2021, 8:58 PM

## 2021-05-27 NOTE — Assessment & Plan Note (Signed)
A1c 6.4% at baseline. Glucose here mostly normal. - Hold metformin - Continue sliding scale corrections - Continue pravastatin

## 2021-05-27 NOTE — Assessment & Plan Note (Signed)
-   CPAP at night °

## 2021-05-28 DIAGNOSIS — A419 Sepsis, unspecified organism: Secondary | ICD-10-CM | POA: Diagnosis not present

## 2021-05-28 LAB — GLUCOSE, CAPILLARY: Glucose-Capillary: 173 mg/dL — ABNORMAL HIGH (ref 70–99)

## 2021-05-28 MED ORDER — SULFAMETHOXAZOLE-TRIMETHOPRIM 800-160 MG PO TABS: 1.0000 | ORAL_TABLET | Freq: Two times a day (BID) | ORAL | 0 refills | Status: AC

## 2021-05-28 NOTE — Discharge Summary (Signed)
Physician Discharge Summary  [name] SFK:[id number] DOB: 23-Nov-1960 DOA: 05/22/2021  PCP: Dion Body, MD  Admit date: 05/22/2021 Discharge date: 05/28/2021  Admitted From: Home Disposition: Home  Recommendations for Outpatient Follow-up:  Follow up with PCP in 1-2 weeks Follow-up with urology in 2 weeks  Home Health: No Equipment/Devices: None  Discharge Condition: Stable CODE STATUS: Full Diet recommendation: Regular  Brief/Interim Summary:  [name] is a 60 y.o. M with DM, HTN, OSA, GERD and hx nephrolithiasis who presented from PCP due to cloudy urine, low back for 1 day, with chills, dizziness, and tachycardia, hypotension.   In the ER, BP 93/66, HR 104, WBC elevated, Cr up to 1.93 from baseline 1.0.  CT renal showed left nephrolithiasis without obsruction.       9/29: Admitted and started on fluids and antibiotics for UTI sepsis.  10/1: Tachycardic and febrile overnight, resolved to baseline 10/2: Still dysuria, still fevering overnight 10/3: Fever curve improving, still chills/rigors 10/4: CT shows prostatitis, abscess, Urology consulted 10/5: Urology consult appreciated.  No surgical intervention warranted.  Cleared for discharge.  Will discharge on 3 weeks of antibiotics.  Of note the patient was previously on Bactrim with poor response however given the lack of other viable options will discharge on Bactrim x3 weeks.  Follow-up with urology within 2 weeks    Discharge Diagnoses:  Principal Problem:   Sepsis ([place]) Active Problems:   Essential hypertension   BPH (benign prostatic hyperplasia)   Diabetes mellitus without complication (HCC)   AKI (acute kidney injury) ([place])   Sleep apnea   Allergic rhinitis   Sinus tachycardia * Sepsis ([place]) Presented with tachypnea, hypotension, leukocytosis, qSOFA 2.  Also acute kidney injury. Renal CT showed punctate tiny nephrolithiasis without obstruction nor abscess.  Unfortunately, urine culture not collected  on admission.   Urine culture no growth.  Fever curve slower than expected to resolve.  Still very symptomatic, sweats last night, weak.     CT obtained today showed prostatitis with small 1cm abscess.  Discussed with Urology who recommend transition to Bactrim at discharge for 3 week course, follow up with primary Urologist Dr. Noah Delaine in 2-3 weeks     Of note, had prostatitis in May, treated with Bactrim and Cipro, has still felt weak and tired and malaise to this point.   Transition to Septra DS 1 tab twice daily x3 weeks.  Outpatient follow-up with urology in [place]   AKI (acute kidney injury) ([place]) Creatinine 1.93 on admission, now resolved to baseline  - Hold Mobic - Resume lisinopril at discharge     Sinus tachycardia Chronic, baseline is 100 bpm.     Essential hypertension Blood pressure normal - Hold home lisinopril   Diabetes mellitus without complication (HCC) F7C 9.4% at baseline. Glucose here mostly normal. - Hold metformin - Continue sliding scale corrections - Continue pravastatin   Allergic rhinitis - Continue montelukast Flonase - Hold cetirizine   Sleep apnea - CPAP at night   BPH (benign prostatic hyperplasia) PVR normal - Continue Flomax   Discharge Instructions  Discharge Instructions     Diet - low sodium heart healthy   Complete by: As directed    Increase activity slowly   Complete by: As directed       Allergies as of 05/28/2021       Reactions   Ciprofloxacin Itching   Contrast Media [iodinated Diagnostic Agents] Itching   Gets hot   Erythromycin    Stomach pain   Ioxaglate  Itching   Metrizamide Itching   Codeine Itching   Hydrocodone-acetaminophen Itching   Metformin And Related Diarrhea   The plain metformin caused severe diarrhea but the ER version doesnt        Medication List     STOP taking these medications    benzonatate 200 MG capsule Commonly known as: TESSALON   Mobic 15 MG tablet Generic drug:  meloxicam       TAKE these medications    acetaminophen 500 MG tablet Commonly known as: TYLENOL Take 1,000 mg by mouth every 8 (eight) hours as needed for moderate pain or headache.   albuterol 108 (90 Base) MCG/ACT inhaler Commonly known as: VENTOLIN HFA Inhale 2 puffs into the lungs every 4 (four) hours as needed for wheezing or shortness of breath.   aspirin 81 MG EC tablet Take 162 mg by mouth daily.   azelastine 0.1 % nasal spray Commonly known as: ASTELIN Place 1 spray into both nostrils 2 (two) times daily as needed for allergies.   Calcium 250 MG Caps Take 250 mg by mouth daily.   cetirizine 10 MG tablet Commonly known as: ZYRTEC Take 10 mg by mouth daily as needed for allergies.   CoQ10 200 MG Caps Take 200 mg by mouth daily.   cyclobenzaprine 10 MG tablet Commonly known as: FLEXERIL Take 1 tablet by mouth every 8 (eight) hours as needed.   Dialyvite Vitamin D 5000 125 MCG (5000 UT) capsule Generic drug: Cholecalciferol Take 5,000 Units by mouth daily.   fluticasone 50 MCG/ACT nasal spray Commonly known as: FLONASE Place 1 spray into the nose daily as needed for allergies.   guaiFENesin 600 MG 12 hr tablet Commonly known as: MUCINEX Take 600 mg by mouth every 12 (twelve) hours as needed (congestion).   ibuprofen 200 MG tablet Commonly known as: ADVIL Take 400 mg by mouth every 6 (six) hours as needed for headache or moderate pain.   lisinopril 20 MG tablet Commonly known as: ZESTRIL Take 20 mg by mouth daily.   lovastatin 20 MG tablet Commonly known as: MEVACOR Take 20 mg by mouth at bedtime.   magnesium oxide 400 MG tablet Commonly known as: MAG-OX Take 400 mg by mouth every Monday, Wednesday, and Friday.   metFORMIN 500 MG 24 hr tablet Commonly known as: GLUCOPHAGE-XR Take 500 mg by mouth 2 (two) times daily.   montelukast 10 MG tablet Commonly known as: SINGULAIR Take 10 mg by mouth at bedtime.   multivitamin tablet Take 1 tablet  by mouth daily.   Potassium 99 MG Tabs Take 99 mg by mouth daily.   PROBIOTIC ADVANCED PO Take 1 tablet by mouth daily.   pseudoephedrine 30 MG tablet Commonly known as: SUDAFED Take 30 mg by mouth every 4 (four) hours as needed for congestion.   sulfamethoxazole-trimethoprim 800-160 MG tablet Commonly known as: BACTRIM DS Take 1 tablet by mouth 2 (two) times daily for 21 days.   tadalafil 20 MG tablet Commonly known as: CIALIS Take 1 tablet (20 mg total) by mouth daily as needed for erectile dysfunction.   tamsulosin 0.4 MG Caps capsule Commonly known as: FLOMAX TAKE 1 CAPSULE BY MOUTH AT BEDTIME   testosterone cypionate 200 MG/ML injection Commonly known as: DEPOTESTOSTERONE CYPIONATE Inject 0.6 mLs (120 mg total) into the muscle every 7 (seven) days.   vitamin B-12 1000 MCG tablet Commonly known as: CYANOCOBALAMIN Take 1,000 mcg by mouth daily.        Allergies  Allergen Reactions  Ciprofloxacin Itching   Contrast Media [Iodinated Diagnostic Agents] Itching    Gets hot   Erythromycin     Stomach pain   Ioxaglate Itching   Metrizamide Itching   Codeine Itching   Hydrocodone-Acetaminophen Itching   Metformin And Related Diarrhea    The plain metformin caused severe diarrhea but the ER version doesnt    Consultations: Urology   Procedures/Studies: CT ABDOMEN PELVIS W CONTRAST  Addendum Date: 05/27/2021   ADDENDUM REPORT: 05/27/2021 11:26 ADDENDUM: There should also be an Impression #4 which reads: "4.  Congenital midgut malrotation." Electronically Signed   By: Genevie Ann M.D.   On: 05/27/2021 11:26   Result Date: 05/27/2021 CLINICAL DATA:  60 year old male with UTI sepsis. Left nephrolithiasis on recent noncontrast CT Abdomen and Pelvis. Premedicated for contrast with no adverse reactions noted. EXAM: CT ABDOMEN AND PELVIS WITH CONTRAST TECHNIQUE: Multidetector CT imaging of the abdomen and pelvis was performed using the standard protocol following bolus  administration of intravenous contrast. CONTRAST:  149mL OMNIPAQUE IOHEXOL 350 MG/ML SOLN COMPARISON:  CT Abdomen and Pelvis 05/22/2021 and earlier. FINDINGS: Lower chest: Negative. Hepatobiliary: Possible hepatic steatosis. Otherwise negative liver and gallbladder. Pancreas: Negative. Spleen: Negative. Adrenals/Urinary Tract: Normal adrenal glands. Symmetric renal enhancement and contrast excretion with no obstructive uropathy. No evidence of renal infection. Punctate left midpole nephrolithiasis redemonstrated (series 2, image 34). Both ureters appear negative. Bladder is diminutive, within normal limits. Stomach/Bowel: Distal large bowel appears stable and within normal limits. Occasional diverticula in the descending colon. Redundant transverse colon. Retained stool but no active inflammation. Terminal ileum is decompressed and negative. Diminutive and normal appendix medial to the cecum on coronal image 42. No dilated small bowel. The duodenum does not cross midline. Proximal small bowel loops are in the right abdomen. But no free air, free fluid, or mesenteric inflammation identified. Vascular/Lymphatic: Major arterial structures are patent. No lymphadenopathy. Portal venous system is patent. Reproductive: Prostatomegaly, increased since 2013. Heterogeneous prostate parenchyma with the benefit of contrast today, including a small 12 mm rim enhancing low-density area in the right lower prostate seen on coronal image 67. No obvious regional soft tissue inflammation. Other: No pelvic free fluid. Musculoskeletal: No acute osseous abnormality identified. Occasional benign bone islands are stable since 2013. IMPRESSION: 1. Prostatomegaly, with heterogeneous prostate parenchyma including a small 1.2 cm rim enhancing low-density area in the right lower prostate (series 5, image 67). Acute Prostatitis and/or small Prostate Abscess not excluded. 2. But no evidence of renal infection or obstructive uropathy. Punctate  left nephrolithiasis again noted. 3. No other acute or inflammatory process identified in the abdomen or pelvis. Electronically Signed: By: Genevie Ann M.D. On: 05/27/2021 10:58   DG Chest Port 1 View  Result Date: 05/24/2021 CLINICAL DATA:  Tachycardia. EXAM: PORTABLE CHEST 1 VIEW COMPARISON:  09/10/2020 FINDINGS: Perihilar interstitial prominence. Normal heart size and mediastinal contours. There is no edema, consolidation, effusion, or pneumothorax. IMPRESSION: Interstitial prominence about the hila may be vascular congestion. Normal heart size. Electronically Signed   By: Jorje Guild M.D.   On: 05/24/2021 06:04   CT Renal Stone Study  Result Date: 05/22/2021 CLINICAL DATA:  Bilateral flank pain.  Dysuria. EXAM: CT ABDOMEN AND PELVIS WITHOUT CONTRAST TECHNIQUE: Multidetector CT imaging of the abdomen and pelvis was performed following the standard protocol without IV contrast. COMPARISON:  01/13/2021 FINDINGS: Lower chest: Clear lung bases. Normal heart size without pericardial or pleural effusion. Hepatobiliary: Moderate hepatic steatosis. Normal gallbladder, without biliary ductal dilatation. Pancreas: Normal, without  mass or ductal dilatation. Spleen: Normal in size, without focal abnormality. Adrenals/Urinary Tract: Normal adrenal glands. Punctate interpolar left renal collecting system calculus. No right renal calculi. No hydronephrosis. No hydroureter or ureteric calculi. No bladder calculi. Stomach/Bowel: Small bowel malrotation again identified. Normal position of the ileocecal junction. Appendix is not visualized but there is no evidence of right lower quadrant inflammation. Otherwise normal small bowel. Vascular/Lymphatic: Aortic atherosclerosis. No abdominopelvic adenopathy. Reproductive: Mild prostatomegaly. Other: No significant free fluid.  No free intraperitoneal air. 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 Lower thoracic and less so upper lumbar  spondylosis. IMPRESSION: 1. Left nephrolithiasis. No obstructive uropathy. 2. Hepatic steatosis. 3. Small bowel malrotation, as before. 4. Prostatomegaly. 5. Aortic Atherosclerosis (ICD10-I70.0). Electronically Signed   By: Abigail Miyamoto M.D.   On: 05/22/2021 10:35   (Echo, Carotid, EGD, Colonoscopy, ERCP)    Subjective: Seen and examined on the day of discharge.  Stable no distress.  Pain-free.  Discharge Exam: Vitals:   05/28/21 0334 05/28/21 0758  BP: 140/90 124/85  Pulse: 82 84  Resp: 18 18  Temp: 97.6 F (36.4 C) (!) 97.5 F (36.4 C)  SpO2: 96% 97%   Vitals:   05/27/21 1522 05/27/21 1911 05/28/21 0334 05/28/21 0758  BP: 140/84 139/88 140/90 124/85  Pulse: 97 (!) 102 82 84  Resp: 19 17 18 18   Temp: 97.9 F (36.6 C) 97.9 F (36.6 C) 97.6 F (36.4 C) (!) 97.5 F (36.4 C)  TempSrc: Oral Oral Oral Oral  SpO2: 97% 95% 96% 97%  Weight:      Height:        General: Pt is alert, awake, not in acute distress Cardiovascular: RRR, S1/S2 +, no rubs, no gallops Respiratory: CTA bilaterally, no wheezing, no rhonchi Abdominal: Soft, NT, ND, bowel sounds + Extremities: no edema, no cyanosis    The results of significant diagnostics from this hospitalization (including imaging, microbiology, ancillary and laboratory) are listed below for reference.     Microbiology: Recent Results (from the past 240 hour(s))  Blood culture (single)     Status: None   Collection Time: 05/22/21  9:05 AM   Specimen: BLOOD  Result Value Ref Range Status   Specimen Description BLOOD BLOOD RIGHT HAND  Final   Special Requests   Final    BOTTLES DRAWN AEROBIC AND ANAEROBIC Blood Culture adequate volume   Culture   Final    NO GROWTH 5 DAYS Performed at [facility], [address] [place], [place] [address]    Report Status 05/27/2021 FINAL  Final  Blood culture (single)     Status: None   Collection Time: 05/22/21  9:38 AM   Specimen: BLOOD LEFT HAND  Result Value Ref Range Status    Specimen Description BLOOD LEFT HAND  Final   Special Requests   Final    BOTTLES DRAWN AEROBIC AND ANAEROBIC Blood Culture results may not be optimal due to an inadequate volume of blood received in culture bottles   Culture   Final    NO GROWTH 5 DAYS Performed at [facility], [address], [place], [place] [address]    Report Status 05/27/2021 FINAL  Final  Resp Panel by RT-PCR (Flu A&B, Covid) Nasopharyngeal Swab     Status: None   Collection Time: 05/22/21 12:03 PM   Specimen: Nasopharyngeal Swab; Nasopharyngeal(NP) swabs in vial transport medium  Result Value Ref Range Status   SARS Coronavirus 2 by RT PCR NEGATIVE NEGATIVE Final  Comment: (NOTE) SARS-CoV-2 target nucleic acids are NOT DETECTED.  The SARS-CoV-2 RNA is generally detectable in upper respiratory specimens during the acute phase of infection. The lowest concentration of SARS-CoV-2 viral copies this assay can detect is 138 copies/mL. A negative result does not preclude SARS-Cov-2 infection and should not be used as the sole basis for treatment or other patient management decisions. A negative result may occur with  improper specimen collection/handling, submission of specimen other than nasopharyngeal swab, presence of viral mutation(s) within the areas targeted by this assay, and inadequate number of viral copies(<138 copies/mL). A negative result must be combined with clinical observations, patient history, and epidemiological information. The expected result is Negative.  Fact Sheet for Patients:  EntrepreneurPulse.com.au  Fact Sheet for Healthcare Providers:  IncredibleEmployment.be  This test is no t yet approved or cleared by the [place] FDA and  has been authorized for detection and/or diagnosis of SARS-CoV-2 by FDA under an Emergency Use Authorization (EUA). This EUA will remain  in effect (meaning this test can be used) for the duration of  the COVID-19 declaration under Section 564(b)(1) of the Act, 21 U.S.C.section 360bbb-3(b)(1), unless the authorization is terminated  or revoked sooner.       Influenza A by PCR NEGATIVE NEGATIVE Final   Influenza B by PCR NEGATIVE NEGATIVE Final    Comment: (NOTE) The Xpert Xpress SARS-CoV-2/FLU/RSV plus assay is intended as an aid in the diagnosis of influenza from Nasopharyngeal swab specimens and should not be used as a sole basis for treatment. Nasal washings and aspirates are unacceptable for Xpert Xpress SARS-CoV-2/FLU/RSV testing.  Fact Sheet for Patients: EntrepreneurPulse.com.au  Fact Sheet for Healthcare Providers: IncredibleEmployment.be  This test is not yet approved or cleared by the [place] FDA and has been authorized for detection and/or diagnosis of SARS-CoV-2 by FDA under an Emergency Use Authorization (EUA). This EUA will remain in effect (meaning this test can be used) for the duration of the COVID-19 declaration under Section 564(b)(1) of the Act, 21 U.S.C. section 360bbb-3(b)(1), unless the authorization is terminated or revoked.  Performed at [facility], [address], [place], [place] [address]   Urine Culture     Status: None   Collection Time: 05/24/21  8:39 AM   Specimen: Urine, Clean Catch  Result Value Ref Range Status   Specimen Description   Final    URINE, CLEAN CATCH Performed at [facility], [address], [place], [place] [address]    Special Requests   Final    NONE Performed at [facility], [address] [place], [place] [address]    Culture   Final    NO GROWTH Performed at [facility], [place] [address], [place],  [address]    Report Status 05/25/2021 FINAL  Final     Labs: BNP (last 3 results) No results for input(s): BNP in the last 8760 hours. Basic Metabolic Panel: Recent Labs  Lab 05/23/21 0519 05/24/21 0431 05/25/21 0828  05/26/21 0451 05/27/21 0441  NA 135 138 139 137 139  K 3.6 4.0 4.3 4.0 4.1  CL 100 100 97* 98 102  CO2 27 30 29 31 28   GLUCOSE 144* 111* 144* 122* 203*  BUN 19 13 16 16  21*  CREATININE 1.31* 1.29* 1.26* 1.06 1.10  CALCIUM 8.1* 8.5* 9.2 8.8* 9.4  MG 2.0  --   --   --   --    Liver Function Tests: Recent Labs  Lab 05/22/21  0630 05/24/21 0431  AST 27 26  ALT 35 28  ALKPHOS 55 51  BILITOT 1.4* 1.1  PROT 7.0 6.2*  ALBUMIN 4.4 3.5   No results for input(s): LIPASE, AMYLASE in the last 168 hours. No results for input(s): AMMONIA in the last 168 hours. CBC: Recent Labs  Lab 05/22/21 0833 05/24/21 0431 05/25/21 0828 05/26/21 0451 05/27/21 0441  WBC 24.0* 6.5 5.0 4.3 5.5  HGB 17.8* 15.1 16.7 16.8 17.6*  HCT 49.1 42.7 47.7 46.7 49.1  MCV 90.1 90.3 90.2 89.0 88.5  PLT 186 111* 129* 129* 169   Cardiac Enzymes: No results for input(s): CKTOTAL, CKMB, CKMBINDEX, TROPONINI in the last 168 hours. BNP: Invalid input(s): POCBNP CBG: Recent Labs  Lab 05/27/21 0728 05/27/21 1137 05/27/21 1644 05/27/21 2201 05/28/21 0758  GLUCAP 238* 195* 224* 233* 173*   D-Dimer No results for input(s): DDIMER in the last 72 hours. Hgb A1c No results for input(s): HGBA1C in the last 72 hours. Lipid Profile No results for input(s): CHOL, HDL, LDLCALC, TRIG, CHOLHDL, LDLDIRECT in the last 72 hours. Thyroid function studies No results for input(s): TSH, T4TOTAL, T3FREE, THYROIDAB in the last 72 hours.  Invalid input(s): FREET3 Anemia work up No results for input(s): VITAMINB12, FOLATE, FERRITIN, TIBC, IRON, RETICCTPCT in the last 72 hours. Urinalysis    Component Value Date/Time   COLORURINE AMBER (A) 05/22/2021 0833   APPEARANCEUR HAZY (A) 05/22/2021 0833   APPEARANCEUR Clear 08/09/2020 1546   LABSPEC 1.032 (H) 05/22/2021 0833   LABSPEC 1.015 11/03/2013 1218   PHURINE 5.0 05/22/2021 0833   GLUCOSEU NEGATIVE 05/22/2021 0833   GLUCOSEU NEGATIVE 11/03/2013 1218   HGBUR MODERATE (A)  05/22/2021 0833   BILIRUBINUR SMALL (A) 05/22/2021 0833   BILIRUBINUR Negative 08/09/2020 1546   BILIRUBINUR Negative 11/13/2011 1910   KETONESUR 5 (A) 05/22/2021 0833   PROTEINUR >=300 (A) 05/22/2021 0833   UROBILINOGEN 2.0 (H) 08/23/2014 2100   NITRITE NEGATIVE 05/22/2021 0833   LEUKOCYTESUR MODERATE (A) 05/22/2021 0833   LEUKOCYTESUR Negative 11/13/2011 1910   Sepsis Labs Invalid input(s): PROCALCITONIN,  WBC,  LACTICIDVEN Microbiology Recent Results (from the past 240 hour(s))  Blood culture (single)     Status: None   Collection Time: 05/22/21  9:05 AM   Specimen: BLOOD  Result Value Ref Range Status   Specimen Description BLOOD BLOOD RIGHT HAND  Final   Special Requests   Final    BOTTLES DRAWN AEROBIC AND ANAEROBIC Blood Culture adequate volume   Culture   Final    NO GROWTH 5 DAYS Performed at [facility], [address] [place], [place] [address]    Report Status 05/27/2021 FINAL  Final  Blood culture (single)     Status: None   Collection Time: 05/22/21  9:38 AM   Specimen: BLOOD LEFT HAND  Result Value Ref Range Status   Specimen Description BLOOD LEFT HAND  Final   Special Requests   Final    BOTTLES DRAWN AEROBIC AND ANAEROBIC Blood Culture results may not be optimal due to an inadequate volume of blood received in culture bottles   Culture   Final    NO GROWTH 5 DAYS Performed at [facility], [address] [place], [place] [address]    Report Status 05/27/2021 FINAL  Final  Resp Panel by RT-PCR (Flu A&B, Covid) Nasopharyngeal Swab     Status: None   Collection Time: 05/22/21 12:03 PM   Specimen: Nasopharyngeal Swab; Nasopharyngeal(NP) swabs in vial transport medium  Result  Value Ref Range Status   SARS Coronavirus 2 by RT PCR NEGATIVE NEGATIVE Final    Comment: (NOTE) SARS-CoV-2 target nucleic acids are NOT DETECTED.  The SARS-CoV-2 RNA is generally detectable in upper respiratory specimens during the acute phase of infection. The  lowest concentration of SARS-CoV-2 viral copies this assay can detect is 138 copies/mL. A negative result does not preclude SARS-Cov-2 infection and should not be used as the sole basis for treatment or other patient management decisions. A negative result may occur with  improper specimen collection/handling, submission of specimen other than nasopharyngeal swab, presence of viral mutation(s) within the areas targeted by this assay, and inadequate number of viral copies(<138 copies/mL). A negative result must be combined with clinical observations, patient history, and epidemiological information. The expected result is Negative.  Fact Sheet for Patients:  EntrepreneurPulse.com.au  Fact Sheet for Healthcare Providers:  IncredibleEmployment.be  This test is no t yet approved or cleared by the [place] FDA and  has been authorized for detection and/or diagnosis of SARS-CoV-2 by FDA under an Emergency Use Authorization (EUA). This EUA will remain  in effect (meaning this test can be used) for the duration of the COVID-19 declaration under Section 564(b)(1) of the Act, 21 U.S.C.section 360bbb-3(b)(1), unless the authorization is terminated  or revoked sooner.       Influenza A by PCR NEGATIVE NEGATIVE Final   Influenza B by PCR NEGATIVE NEGATIVE Final    Comment: (NOTE) The Xpert Xpress SARS-CoV-2/FLU/RSV plus assay is intended as an aid in the diagnosis of influenza from Nasopharyngeal swab specimens and should not be used as a sole basis for treatment. Nasal washings and aspirates are unacceptable for Xpert Xpress SARS-CoV-2/FLU/RSV testing.  Fact Sheet for Patients: EntrepreneurPulse.com.au  Fact Sheet for Healthcare Providers: IncredibleEmployment.be  This test is not yet approved or cleared by the [place] FDA and has been authorized for detection and/or diagnosis of SARS-CoV-2 by FDA under  an Emergency Use Authorization (EUA). This EUA will remain in effect (meaning this test can be used) for the duration of the COVID-19 declaration under Section 564(b)(1) of the Act, 21 U.S.C. section 360bbb-3(b)(1), unless the authorization is terminated or revoked.  Performed at [facility], [address], [place], [place] [address]   Urine Culture     Status: None   Collection Time: 05/24/21  8:39 AM   Specimen: Urine, Clean Catch  Result Value Ref Range Status   Specimen Description   Final    URINE, CLEAN CATCH Performed at [facility], [address], [place], [place] [address]    Special Requests   Final    NONE Performed at [facility], [address], [place], [place] [address]    Culture   Final    NO GROWTH Performed at [facility], [place] [address], [place], [place] [address]    Report Status 05/25/2021 FINAL  Final     Time coordinating discharge: Over 30 minutes  SIGNED:   Sidney Ace, MD  [facility] 05/28/2021, 12:07 PM Pager   If 7PM-7AM, please contact night-coverage

## 2021-06-20 ENCOUNTER — Ambulatory Visit (INDEPENDENT_AMBULATORY_CARE_PROVIDER_SITE_OTHER): Payer: BC Managed Care – PPO | Admitting: Urology

## 2021-06-20 ENCOUNTER — Other Ambulatory Visit: Payer: Self-pay

## 2021-06-20 VITALS — BP 110/68 | HR 98

## 2021-06-20 DIAGNOSIS — N401 Enlarged prostate with lower urinary tract symptoms: Secondary | ICD-10-CM | POA: Diagnosis not present

## 2021-06-20 DIAGNOSIS — N412 Abscess of prostate: Secondary | ICD-10-CM | POA: Diagnosis not present

## 2021-06-20 DIAGNOSIS — R351 Nocturia: Secondary | ICD-10-CM

## 2021-06-20 LAB — URINALYSIS, ROUTINE W REFLEX MICROSCOPIC
Bilirubin, UA: NEGATIVE
Glucose, UA: NEGATIVE
Ketones, UA: NEGATIVE
Leukocytes,UA: NEGATIVE
Nitrite, UA: NEGATIVE
Protein,UA: NEGATIVE
RBC, UA: NEGATIVE
Specific Gravity, UA: 1.025 (ref 1.005–1.030)
Urobilinogen, Ur: 0.2 mg/dL (ref 0.2–1.0)
pH, UA: 6.5 (ref 5.0–7.5)

## 2021-06-20 MED ORDER — SILODOSIN 8 MG PO CAPS: 8.0000 mg | ORAL_CAPSULE | Freq: Every day | ORAL | 11 refills | Status: DC

## 2021-06-20 NOTE — Progress Notes (Signed)
06/20/2021 10:03 AM   [name] 1960/09/12 [id number]  Referring provider: Dion Body, MD [facility] [place],  [place] [address]  Followup BPH   HPI: [name] is a 60yo here for followup for BPH. He was hospitalized 1 month ago for sepsis from a urinary source. He was found to have a 1.2cm prostatic abscess during that hospitalization. He has since completed his course of antibiotics and his LUTS have returned to his baseline. He is currently on flomax 0.4mg  daily. IPSS 18 QOL 3. He has an intermittent weak urinary stream. Nocturia 1-2x. He has intermittent straining to urinate. No other complaints today   PMH: Past Medical History:  Diagnosis Date   BPH (benign prostatic hyperplasia)    Diabetes mellitus without complication ([place])    Fatty liver    GERD (gastroesophageal reflux disease)    no meds   History of hiatal hernia    Hyperlipidemia    Hypertension    Low testosterone    Medical history non-contributory    Polycythemia    Sleep apnea    uses a cpap nightly    Surgical History: Past Surgical History:  Procedure Laterality Date   COLONOSCOPY     I & D EXTREMITY Left 06/20/2013   Procedure: MINOR IRRIGATION AND DEBRIDEMENT LEFT INDEX FINGER;  Surgeon: Tennis Must, MD;  Location: [place];  Service: Orthopedics;  Laterality: Left;   NASAL SINUS SURGERY     UPPER GASTROINTESTINAL ENDOSCOPY     WISDOM TOOTH EXTRACTION      Home Medications:  Allergies as of 06/20/2021       Reactions   Ciprofloxacin Itching   Contrast Media [iodinated Diagnostic Agents] Itching   Gets hot   Erythromycin    Stomach pain   Ioxaglate Itching   Metrizamide Itching   Codeine Itching   Hydrocodone-acetaminophen Itching   Metformin And Related Diarrhea   The plain metformin caused severe diarrhea but the ER version doesnt        Medication List        Accurate as of June 20, 2021 10:03 AM. If you have  any questions, ask your nurse or doctor.          acetaminophen 500 MG tablet Commonly known as: TYLENOL Take 1,000 mg by mouth every 8 (eight) hours as needed for moderate pain or headache.   albuterol 108 (90 Base) MCG/ACT inhaler Commonly known as: VENTOLIN HFA Inhale 2 puffs into the lungs every 4 (four) hours as needed for wheezing or shortness of breath.   aspirin 81 MG EC tablet Take 162 mg by mouth daily.   azelastine 0.1 % nasal spray Commonly known as: ASTELIN Place 1 spray into both nostrils 2 (two) times daily as needed for allergies.   Calcium 250 MG Caps Take 250 mg by mouth daily.   cetirizine 10 MG tablet Commonly known as: ZYRTEC Take 10 mg by mouth daily as needed for allergies.   CoQ10 200 MG Caps Take 200 mg by mouth daily.   cyclobenzaprine 10 MG tablet Commonly known as: FLEXERIL Take 1 tablet by mouth every 8 (eight) hours as needed.   Dialyvite Vitamin D 5000 125 MCG (5000 UT) capsule Generic drug: Cholecalciferol Take 5,000 Units by mouth daily.   fluticasone 50 MCG/ACT nasal spray Commonly known as: FLONASE Place 1 spray into the nose daily as needed for allergies.   guaiFENesin 600 MG 12 hr tablet Commonly known as:  MUCINEX Take 600 mg by mouth every 12 (twelve) hours as needed (congestion).   ibuprofen 200 MG tablet Commonly known as: ADVIL Take 400 mg by mouth every 6 (six) hours as needed for headache or moderate pain.   lisinopril 20 MG tablet Commonly known as: ZESTRIL Take 20 mg by mouth daily.   lovastatin 20 MG tablet Commonly known as: MEVACOR Take 20 mg by mouth at bedtime.   magnesium oxide 400 MG tablet Commonly known as: MAG-OX Take 400 mg by mouth every Monday, Wednesday, and Friday.   metFORMIN 500 MG 24 hr tablet Commonly known as: GLUCOPHAGE-XR Take 500 mg by mouth 2 (two) times daily.   montelukast 10 MG tablet Commonly known as: SINGULAIR Take 10 mg by mouth at bedtime.   multivitamin tablet Take 1  tablet by mouth daily.   Potassium 99 MG Tabs Take 99 mg by mouth daily.   PROBIOTIC ADVANCED PO Take 1 tablet by mouth daily.   pseudoephedrine 30 MG tablet Commonly known as: SUDAFED Take 30 mg by mouth every 4 (four) hours as needed for congestion.   tadalafil 20 MG tablet Commonly known as: CIALIS Take 1 tablet (20 mg total) by mouth daily as needed for erectile dysfunction.   tamsulosin 0.4 MG Caps capsule Commonly known as: FLOMAX TAKE 1 CAPSULE BY MOUTH AT BEDTIME   testosterone cypionate 200 MG/ML injection Commonly known as: DEPOTESTOSTERONE CYPIONATE Inject 0.6 mLs (120 mg total) into the muscle every 7 (seven) days.   vitamin B-12 1000 MCG tablet Commonly known as: CYANOCOBALAMIN Take 1,000 mcg by mouth daily.        Allergies:  Allergies  Allergen Reactions   Ciprofloxacin Itching   Contrast Media [Iodinated Diagnostic Agents] Itching    Gets hot   Erythromycin     Stomach pain   Ioxaglate Itching   Metrizamide Itching   Codeine Itching   Hydrocodone-Acetaminophen Itching   Metformin And Related Diarrhea    The plain metformin caused severe diarrhea but the ER version doesnt    Family History: Family History  Problem Relation Age of Onset   Colon cancer Father     Social History:  reports that he has never smoked. He has never used smokeless tobacco. He reports current alcohol use. He reports that he does not use drugs.  ROS: All other review of systems were reviewed and are negative except what is noted above in HPI  Physical Exam: BP 110/68   Pulse 98   Constitutional:  Alert and oriented, No acute distress. HEENT: [place] AT, moist mucus membranes.  Trachea midline, no masses. Cardiovascular: No clubbing, cyanosis, or edema. Respiratory: Normal respiratory effort, no increased work of breathing. GI: Abdomen is soft, nontender, nondistended, no abdominal masses GU: No CVA tenderness.  Lymph: No cervical or inguinal lymphadenopathy. Skin:  No rashes, bruises or suspicious lesions. Neurologic: Grossly intact, no focal deficits, moving all 4 extremities. Psychiatric: Normal mood and affect.  Laboratory Data: Lab Results  Component Value Date   WBC 5.5 05/27/2021   HGB 17.6 (H) 05/27/2021   HCT 49.1 05/27/2021   MCV 88.5 05/27/2021   PLT 169 05/27/2021    Lab Results  Component Value Date   CREATININE 1.10 05/27/2021    No results found for: PSA  Lab Results  Component Value Date   TESTOSTERONE 391 02/03/2021    Lab Results  Component Value Date   HGBA1C 6.4 (H) 05/22/2021    Urinalysis    Component Value Date/Time  COLORURINE AMBER (A) 05/22/2021 0833   APPEARANCEUR HAZY (A) 05/22/2021 0833   APPEARANCEUR Clear 08/09/2020 1546   LABSPEC 1.032 (H) 05/22/2021 0833   LABSPEC 1.015 11/03/2013 1218   PHURINE 5.0 05/22/2021 0833   GLUCOSEU NEGATIVE 05/22/2021 0833   GLUCOSEU NEGATIVE 11/03/2013 1218   HGBUR MODERATE (A) 05/22/2021 0833   BILIRUBINUR SMALL (A) 05/22/2021 0833   BILIRUBINUR Negative 08/09/2020 1546   BILIRUBINUR Negative 11/13/2011 1910   KETONESUR 5 (A) 05/22/2021 0833   PROTEINUR >=300 (A) 05/22/2021 0833   UROBILINOGEN 2.0 (H) 08/23/2014 2100   NITRITE NEGATIVE 05/22/2021 0833   LEUKOCYTESUR MODERATE (A) 05/22/2021 0833   LEUKOCYTESUR Negative 11/13/2011 1910    Lab Results  Component Value Date   LABMICR Comment 08/09/2020   BACTERIA MANY (A) 05/22/2021    Pertinent Imaging:  No results found for this or any previous visit.  No results found for this or any previous visit.  No results found for this or any previous visit.  No results found for this or any previous visit.  No results found for this or any previous visit.  No results found for this or any previous visit.  No results found for this or any previous visit.  Results for orders placed during the hospital encounter of 05/22/21  CT Renal Stone Study  Narrative CLINICAL DATA:  Bilateral flank pain.   Dysuria.  EXAM: CT ABDOMEN AND PELVIS WITHOUT CONTRAST  TECHNIQUE: Multidetector CT imaging of the abdomen and pelvis was performed following the standard protocol without IV contrast.  COMPARISON:  01/13/2021  FINDINGS: Lower chest: Clear lung bases. Normal heart size without pericardial or pleural effusion.  Hepatobiliary: Moderate hepatic steatosis. Normal gallbladder, without biliary ductal dilatation.  Pancreas: Normal, without mass or ductal dilatation.  Spleen: Normal in size, without focal abnormality.  Adrenals/Urinary Tract: Normal adrenal glands. Punctate interpolar left renal collecting system calculus. No right renal calculi. No hydronephrosis. No hydroureter or ureteric calculi. No bladder calculi.  Stomach/Bowel: Small bowel malrotation again identified. Normal position of the ileocecal junction. Appendix is not visualized but there is no evidence of right lower quadrant inflammation. Otherwise normal small bowel.  Vascular/Lymphatic: Aortic atherosclerosis. No abdominopelvic adenopathy.  Reproductive: Mild prostatomegaly.  Other: No significant free fluid.  No free intraperitoneal air.  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 Lower thoracic and less so upper lumbar spondylosis.  IMPRESSION: 1. Left nephrolithiasis. No obstructive uropathy. 2. Hepatic steatosis. 3. Small bowel malrotation, as before. 4. Prostatomegaly. 5. Aortic Atherosclerosis (ICD10-I70.0).   Electronically Signed By: Abigail Miyamoto M.D. On: 05/22/2021 10:35   Assessment & Plan:    1. Nocturia -Rapaflo 8mg  qhs - Urinalysis, Routine w reflex microscopic  2. BPH with LUTS -rapaflo 8mg  qhs  3. Prostate abscess -CT pelvis in 1 month   No follow-ups on file.  Nicolette Bang, MD  [facility] [place]

## 2021-06-20 NOTE — Progress Notes (Signed)
Urological Symptom Review  Patient is experiencing the following symptoms: Frequent urination Get up at night to urinate Leakage of urine Urinary tract infection   Review of Systems  Gastrointestinal (upper)  : Negative for upper GI symptoms  Gastrointestinal (lower) : Negative for lower GI symptoms  Constitutional : Negative for symptoms  Skin: Negative for skin symptoms  Eyes: Negative for eye symptoms  Ear/Nose/Throat : Negative for Ear/Nose/Throat symptoms  Hematologic/Lymphatic: Negative for Hematologic/Lymphatic symptoms  Cardiovascular : Negative for cardiovascular symptoms  Respiratory : Negative for respiratory symptoms  Endocrine: Negative for endocrine symptoms  Musculoskeletal: Negative for musculoskeletal symptoms  Neurological: Negative for neurological symptoms  Psychologic: Negative for psychiatric symptoms

## 2021-06-26 ENCOUNTER — Encounter: Payer: Self-pay | Admitting: Urology

## 2021-06-26 NOTE — Patient Instructions (Signed)
Benign Prostatic Hyperplasia Benign prostatic hyperplasia (BPH) is an enlarged prostate gland that is caused by the normal aging process and not by cancer. The prostate is a walnut-sized gland that is involved in the production of semen. It is located in front of the rectum and below the bladder. The bladder stores urine and the urethra is the tube that carries the urine out of the body. The prostate may get bigger as a man gets older. An enlarged prostate can press on the urethra. This can make it harder to pass urine. The build-up of urine in the bladder can cause infection. Back pressure and infection may progress to bladder damage and kidney (renal) failure. What are the causes? This condition is part of a normal aging process. However, not all men develop problems from this condition. If the prostate enlarges away from the urethra, urine flow will not be blocked. If it enlarges toward the urethra and compresses it, there will be problems passing urine. What increases the risk? This condition is more likely to develop in men over the age of 50 years. What are the signs or symptoms? Symptoms of this condition include: Getting up often during the night to urinate. Needing to urinate frequently during the day. Difficulty starting urine flow. Decrease in size and strength of your urine stream. Leaking (dribbling) after urinating. Inability to pass urine. This needs immediate treatment. Inability to completely empty your bladder. Pain when you pass urine. This is more common if there is also an infection. Urinary tract infection (UTI). How is this diagnosed? This condition is diagnosed based on your medical history, a physical exam, and your symptoms. Tests will also be done, such as: A post-void bladder scan. This measures any amount of urine that may remain in your bladder after you finish urinating. A digital rectal exam. In a rectal exam, your health care provider checks your prostate by  putting a lubricated, gloved finger into your rectum to feel the back of your prostate gland. This exam detects the size of your gland and any abnormal lumps or growths. An exam of your urine (urinalysis). A prostate specific antigen (PSA) screening. This is a blood test used to screen for prostate cancer. An ultrasound. This test uses sound waves to electronically produce a picture of your prostate gland. Your health care provider may refer you to a specialist in kidney and prostate diseases (urologist). How is this treated? Once symptoms begin, your health care provider will monitor your condition (active surveillance or watchful waiting). Treatment for this condition will depend on the severity of your condition. Treatment may include: Observation and yearly exams. This may be the only treatment needed if your condition and symptoms are mild. Medicines to relieve your symptoms, including: Medicines to shrink the prostate. Medicines to relax the muscle of the prostate. Surgery in severe cases. Surgery may include: Prostatectomy. In this procedure, the prostate tissue is removed completely through an open incision or with a laparoscope or robotics. Transurethral resection of the prostate (TURP). In this procedure, a tool is inserted through the opening at the tip of the penis (urethra). It is used to cut away tissue of the inner core of the prostate. The pieces are removed through the same opening of the penis. This removes the blockage. Transurethral incision (TUIP). In this procedure, small cuts are made in the prostate. This lessens the prostate's pressure on the urethra. Transurethral microwave thermotherapy (TUMT). This procedure uses microwaves to create heat. The heat destroys and removes a   small amount of prostate tissue. Transurethral needle ablation (TUNA). This procedure uses radio frequencies to destroy and remove a small amount of prostate tissue. Interstitial laser coagulation (ILC).  This procedure uses a laser to destroy and remove a small amount of prostate tissue. Transurethral electrovaporization (TUVP). This procedure uses electrodes to destroy and remove a small amount of prostate tissue. Prostatic urethral lift. This procedure inserts an implant to push the lobes of the prostate away from the urethra. Follow these instructions at home: Take over-the-counter and prescription medicines only as told by your health care provider. Monitor your symptoms for any changes. Contact your health care provider with any changes. Avoid drinking large amounts of liquid before going to bed or out in public. Avoid or reduce how much caffeine or alcohol you drink. Give yourself time when you urinate. Keep all follow-up visits as told by your health care provider. This is important. Contact a health care provider if: You have unexplained back pain. Your symptoms do not get better with treatment. You develop side effects from the medicine you are taking. Your urine becomes very dark or has a bad smell. Your lower abdomen becomes distended and you have trouble passing your urine. Get help right away if: You have a fever or chills. You suddenly cannot urinate. You feel lightheaded, or very dizzy, or you faint. There are large amounts of blood or clots in the urine. Your urinary problems become hard to manage. You develop moderate to severe low back or flank pain. The flank is the side of your body between the ribs and the hip. These symptoms may represent a serious problem that is an emergency. Do not wait to see if the symptoms will go away. Get medical help right away. Call your local emergency services (911 in the [place] Do not drive yourself to the hospital. Summary Benign prostatic hyperplasia (BPH) is an enlarged prostate that is caused by the normal aging process and not by cancer. An enlarged prostate can press on the urethra. This can make it hard to pass urine. This  condition is part of a normal aging process and is more likely to develop in men over the age of 50 years. Get help right away if you suddenly cannot urinate. This information is not intended to replace advice given to you by your health care provider. Make sure you discuss any questions you have with your health care provider. Document Revised: 11/20/2020 Document Reviewed: 04/18/2020 Elsevier Patient Education  2022 [employer]  

## 2021-07-24 ENCOUNTER — Other Ambulatory Visit: Payer: Self-pay

## 2021-07-24 DIAGNOSIS — N412 Abscess of prostate: Secondary | ICD-10-CM

## 2021-07-24 MED ORDER — PREDNISONE 50 MG PO TABS: ORAL_TABLET | ORAL | 0 refills | Status: DC

## 2021-07-24 MED ORDER — DIPHENHYDRAMINE HCL 25 MG PO TABS: 25.0000 mg | ORAL_TABLET | Freq: Once | ORAL | 0 refills | Status: AC

## 2021-07-24 NOTE — Progress Notes (Signed)
Patient called and made aware to pick up medication for CT protocol - non anaphylaxis.

## 2021-07-25 ENCOUNTER — Ambulatory Visit (HOSPITAL_COMMUNITY)
Admission: RE | Admit: 2021-07-25 | Discharge: 2021-07-25 | Disposition: A | Discharge status: Home / Self Care | Payer: BC Managed Care – PPO | Source: Ambulatory Visit | Attending: Urology | Admitting: Urology

## 2021-07-25 DIAGNOSIS — N412 Abscess of prostate: Secondary | ICD-10-CM | POA: Insufficient documentation

## 2021-07-25 LAB — POCT I-STAT CREATININE: Creatinine, Ser: 1 mg/dL (ref 0.61–1.24)

## 2021-07-25 MED ORDER — SODIUM CHLORIDE (PF) 0.9 % IJ SOLN
INTRAMUSCULAR | Status: AC
  Filled 2021-07-25: qty 50

## 2021-07-25 MED ORDER — IOHEXOL 350 MG/ML SOLN
80.0000 mL | Freq: Once | INTRAVENOUS | Status: AC | PRN
  Administered 2021-07-25: 80 mL via INTRAVENOUS

## 2021-08-08 ENCOUNTER — Ambulatory Visit (INDEPENDENT_AMBULATORY_CARE_PROVIDER_SITE_OTHER): Payer: BC Managed Care – PPO | Admitting: Urology

## 2021-08-08 ENCOUNTER — Encounter: Payer: Self-pay | Admitting: Urology

## 2021-08-08 ENCOUNTER — Other Ambulatory Visit: Payer: Self-pay

## 2021-08-08 VITALS — BP 130/83 | HR 102 | Wt 229.0 lb

## 2021-08-08 DIAGNOSIS — E291 Testicular hypofunction: Secondary | ICD-10-CM | POA: Diagnosis not present

## 2021-08-08 DIAGNOSIS — N5201 Erectile dysfunction due to arterial insufficiency: Secondary | ICD-10-CM

## 2021-08-08 DIAGNOSIS — N401 Enlarged prostate with lower urinary tract symptoms: Secondary | ICD-10-CM

## 2021-08-08 DIAGNOSIS — R351 Nocturia: Secondary | ICD-10-CM

## 2021-08-08 LAB — URINALYSIS, ROUTINE W REFLEX MICROSCOPIC
Bilirubin, UA: NEGATIVE
Ketones, UA: NEGATIVE
Leukocytes,UA: NEGATIVE
Nitrite, UA: NEGATIVE
RBC, UA: NEGATIVE
Specific Gravity, UA: >1.03 — ABNORMAL HIGH (ref 1.005–1.030)
Urobilinogen, Ur: 0.2 mg/dL (ref 0.2–1.0)
pH, UA: 5.5 (ref 5.0–7.5)

## 2021-08-08 MED ORDER — TESTOSTERONE CYPIONATE 200 MG/ML IM SOLN: 120.0000 mg | INTRAMUSCULAR | 2 refills | Status: DC

## 2021-08-08 NOTE — Progress Notes (Signed)
08/08/2021 9:52 AM   [name] Sep 11, 1960 [id number]  Referring provider: Dion Body, MD [place] [facility],  [place] [address]  Followup prostate abscess   HPI: [name] is a 60yo here for followup for prostate abscess, BPH and erectile dysfunction. No recent testosterone labs. CT pelvis 07/25/2021 shows resolution of the prostatic abscess. He has stable mild LUTS rapaflo 8mg  daily. IPSS 13 QOL 2. Nocturia 1-2x. He uses tadalafil 20mg  PRN for his erectile dysfunction which works well.    PMH: Past Medical History:  Diagnosis Date   BPH (benign prostatic hyperplasia)    Diabetes mellitus without complication ([place])    Fatty liver    GERD (gastroesophageal reflux disease)    no meds   History of hiatal hernia    Hyperlipidemia    Hypertension    Low testosterone    Medical history non-contributory    Polycythemia    Sleep apnea    uses a cpap nightly    Surgical History: Past Surgical History:  Procedure Laterality Date   COLONOSCOPY     I & D EXTREMITY Left 06/20/2013   Procedure: MINOR IRRIGATION AND DEBRIDEMENT LEFT INDEX FINGER;  Surgeon: Tennis Must, MD;  Location: [facility];  Service: Orthopedics;  Laterality: Left;   NASAL SINUS SURGERY     UPPER GASTROINTESTINAL ENDOSCOPY     WISDOM TOOTH EXTRACTION      Home Medications:  Allergies as of 08/08/2021       Reactions   Ciprofloxacin Itching   Contrast Media [iodinated Diagnostic Agents] Itching   Gets hot   Erythromycin    Stomach pain   Ioxaglate Itching   Metrizamide Itching   Codeine Itching   Hydrocodone-acetaminophen Itching   Metformin And Related Diarrhea   The plain metformin caused severe diarrhea but the ER version doesnt        Medication List        Accurate as of August 08, 2021  9:52 AM. If you have any questions, ask your nurse or doctor.          STOP taking these medications    predniSONE 50 MG  tablet Commonly known as: DELTASONE Stopped by: Nicolette Bang, MD       TAKE these medications    acetaminophen 500 MG tablet Commonly known as: TYLENOL Take 1,000 mg by mouth every 8 (eight) hours as needed for moderate pain or headache.   albuterol 108 (90 Base) MCG/ACT inhaler Commonly known as: VENTOLIN HFA Inhale 2 puffs into the lungs every 4 (four) hours as needed for wheezing or shortness of breath.   aspirin 81 MG EC tablet Take 162 mg by mouth daily.   azelastine 0.1 % nasal spray Commonly known as: ASTELIN Place 1 spray into both nostrils 2 (two) times daily as needed for allergies.   Calcium 250 MG Caps Take 250 mg by mouth daily.   cetirizine 10 MG tablet Commonly known as: ZYRTEC Take 10 mg by mouth daily as needed for allergies.   CoQ10 200 MG Caps Take 200 mg by mouth daily.   cyclobenzaprine 10 MG tablet Commonly known as: FLEXERIL Take 1 tablet by mouth every 8 (eight) hours as needed.   Dialyvite Vitamin D 5000 125 MCG (5000 UT) capsule Generic drug: Cholecalciferol Take 5,000 Units by mouth daily.   diphenhydrAMINE 25 MG tablet Commonly known as: Benadryl Allergy Take 1 tablet (25 mg total) by mouth once for 1 dose. Benadryl  1 hour prior to scan   fluticasone 50 MCG/ACT nasal spray Commonly known as: FLONASE Place 1 spray into the nose daily as needed for allergies.   guaiFENesin 600 MG 12 hr tablet Commonly known as: MUCINEX Take 600 mg by mouth every 12 (twelve) hours as needed (congestion).   ibuprofen 200 MG tablet Commonly known as: ADVIL Take 400 mg by mouth every 6 (six) hours as needed for headache or moderate pain.   lisinopril 20 MG tablet Commonly known as: ZESTRIL Take 20 mg by mouth daily.   lovastatin 20 MG tablet Commonly known as: MEVACOR Take 20 mg by mouth at bedtime.   magnesium oxide 400 MG tablet Commonly known as: MAG-OX Take 400 mg by mouth every Monday, Wednesday, and Friday.   metFORMIN 500 MG 24 hr  tablet Commonly known as: GLUCOPHAGE-XR Take 500 mg by mouth 2 (two) times daily.   montelukast 10 MG tablet Commonly known as: SINGULAIR Take 10 mg by mouth at bedtime.   multivitamin tablet Take 1 tablet by mouth daily.   Potassium 99 MG Tabs Take 99 mg by mouth daily.   PROBIOTIC ADVANCED PO Take 1 tablet by mouth daily.   pseudoephedrine 30 MG tablet Commonly known as: SUDAFED Take 30 mg by mouth every 4 (four) hours as needed for congestion.   silodosin 8 MG Caps capsule Commonly known as: RAPAFLO Take 1 capsule (8 mg total) by mouth at bedtime.   tadalafil 20 MG tablet Commonly known as: CIALIS Take 1 tablet (20 mg total) by mouth daily as needed for erectile dysfunction.   tamsulosin 0.4 MG Caps capsule Commonly known as: FLOMAX TAKE 1 CAPSULE BY MOUTH AT BEDTIME   testosterone cypionate 200 MG/ML injection Commonly known as: DEPOTESTOSTERONE CYPIONATE Inject 0.6 mLs (120 mg total) into the muscle every 7 (seven) days.   vitamin B-12 1000 MCG tablet Commonly known as: CYANOCOBALAMIN Take 1,000 mcg by mouth daily.        Allergies:  Allergies  Allergen Reactions   Ciprofloxacin Itching   Contrast Media [Iodinated Diagnostic Agents] Itching    Gets hot   Erythromycin     Stomach pain   Ioxaglate Itching   Metrizamide Itching   Codeine Itching   Hydrocodone-Acetaminophen Itching   Metformin And Related Diarrhea    The plain metformin caused severe diarrhea but the ER version doesnt    Family History: Family History  Problem Relation Age of Onset   Colon cancer Father     Social History:  reports that he has never smoked. He has never used smokeless tobacco. He reports current alcohol use. He reports that he does not use drugs.  ROS: All other review of systems were reviewed and are negative except what is noted above in HPI  Physical Exam: BP 130/83    Pulse (!) 102    Wt 229 lb (103.9 kg)    BMI 28.62 kg/m   Constitutional:  Alert and  oriented, No acute distress. HEENT: [place] AT, moist mucus membranes.  Trachea midline, no masses. Cardiovascular: No clubbing, cyanosis, or edema. Respiratory: Normal respiratory effort, no increased work of breathing. GI: Abdomen is soft, nontender, nondistended, no abdominal masses GU: No CVA tenderness.  Lymph: No cervical or inguinal lymphadenopathy. Skin: No rashes, bruises or suspicious lesions. Neurologic: Grossly intact, no focal deficits, moving all 4 extremities. Psychiatric: Normal mood and affect.  Laboratory Data: Lab Results  Component Value Date   WBC 5.5 05/27/2021   HGB 17.6 (H) 05/27/2021  HCT 49.1 05/27/2021   MCV 88.5 05/27/2021   PLT 169 05/27/2021    Lab Results  Component Value Date   CREATININE 1.00 07/25/2021    No results found for: PSA  Lab Results  Component Value Date   TESTOSTERONE 391 02/03/2021    Lab Results  Component Value Date   HGBA1C 6.4 (H) 05/22/2021    Urinalysis    Component Value Date/Time   COLORURINE AMBER (A) 05/22/2021 0833   APPEARANCEUR Clear 06/20/2021 0857   LABSPEC 1.032 (H) 05/22/2021 0833   LABSPEC 1.015 11/03/2013 1218   PHURINE 5.0 05/22/2021 0833   GLUCOSEU Negative 06/20/2021 0857   GLUCOSEU NEGATIVE 11/03/2013 1218   HGBUR MODERATE (A) 05/22/2021 0833   BILIRUBINUR Negative 06/20/2021 0857   BILIRUBINUR Negative 11/13/2011 1910   KETONESUR 5 (A) 05/22/2021 0833   PROTEINUR Negative 06/20/2021 0857   PROTEINUR >=300 (A) 05/22/2021 0833   UROBILINOGEN 2.0 (H) 08/23/2014 2100   NITRITE Negative 06/20/2021 0857   NITRITE NEGATIVE 05/22/2021 0833   LEUKOCYTESUR Negative 06/20/2021 0857   LEUKOCYTESUR MODERATE (A) 05/22/2021 0833   LEUKOCYTESUR Negative 11/13/2011 1910    Lab Results  Component Value Date   LABMICR Comment 06/20/2021   BACTERIA MANY (A) 05/22/2021    Pertinent Imaging:  No results found for this or any previous visit.  No results found for this or any previous visit.  No  results found for this or any previous visit.  No results found for this or any previous visit.  No results found for this or any previous visit.  No results found for this or any previous visit.  No results found for this or any previous visit.  Results for orders placed during the hospital encounter of 05/22/21  CT Renal Stone Study  Narrative CLINICAL DATA:  Bilateral flank pain.  Dysuria.  EXAM: CT ABDOMEN AND PELVIS WITHOUT CONTRAST  TECHNIQUE: Multidetector CT imaging of the abdomen and pelvis was performed following the standard protocol without IV contrast.  COMPARISON:  01/13/2021  FINDINGS: Lower chest: Clear lung bases. Normal heart size without pericardial or pleural effusion.  Hepatobiliary: Moderate hepatic steatosis. Normal gallbladder, without biliary ductal dilatation.  Pancreas: Normal, without mass or ductal dilatation.  Spleen: Normal in size, without focal abnormality.  Adrenals/Urinary Tract: Normal adrenal glands. Punctate interpolar left renal collecting system calculus. No right renal calculi. No hydronephrosis. No hydroureter or ureteric calculi. No bladder calculi.  Stomach/Bowel: Small bowel malrotation again identified. Normal position of the ileocecal junction. Appendix is not visualized but there is no evidence of right lower quadrant inflammation. Otherwise normal small bowel.  Vascular/Lymphatic: Aortic atherosclerosis. No abdominopelvic adenopathy.  Reproductive: Mild prostatomegaly.  Other: No significant free fluid.  No free intraperitoneal air.  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 Lower thoracic and less so upper lumbar spondylosis.  IMPRESSION: 1. Left nephrolithiasis. No obstructive uropathy. 2. Hepatic steatosis. 3. Small bowel malrotation, as before. 4. Prostatomegaly. 5. Aortic Atherosclerosis (ICD10-I70.0).   Electronically Signed By: Abigail Miyamoto M.D. On: 05/22/2021  10:35   Assessment & Plan:    1. Nocturia -continue rapaflo 8mg  qhs - Urinalysis, Routine w reflex microscopic  2. Benign localized prostatic hyperplasia with lower urinary tract symptoms (LUTS) -COntinue rapaflo8mg  qhs  3. Erectile dysfunction due to arterial insufficiency -Continue tadlafil 20mg  prn  4. Male hypogonadism  - Testosterone,Free and Total; Future - Comprehensive metabolic panel; Future - CBC With differential/Platelet; Future - Testosterone,Free and Total; Future - Comprehensive metabolic panel; Future - CBC  With differential/Platelet; Future - testosterone cypionate (DEPOTESTOSTERONE CYPIONATE) 200 MG/ML injection; Inject 0.6 mLs (120 mg total) into the muscle every 7 (seven) days.  Dispense: 10 mL; Refill: 2   Return in about 6 months (around 02/06/2022) for testosterone labs.  Nicolette Bang, MD  [facility] [place]

## 2021-08-08 NOTE — Progress Notes (Signed)

## 2021-08-08 NOTE — Patient Instructions (Signed)
Testosterone Injection What is this medication? TESTOSTERONE (tes TOS ter one) is used to increase testosterone levels in your body. It belongs to a group of medications called androgen hormones. This medicine may be used for other purposes; ask your health care provider or pharmacist if you have questions. COMMON BRAND NAME(S): Andro-L.A., Aveed, Delatestryl, Depo-Testosterone, Virilon What should I tell my care team before I take this medication? They need to know if you have any of these conditions: Cancer Diabetes Heart disease Kidney disease Liver disease Lung disease Prostate disease An unusual or allergic reaction to testosterone, other medications, foods, dyes, or preservatives If a male partner is pregnant or trying to get pregnant Breast-feeding How should I use this medication? This medication is for injection into a muscle. It is usually given in a hospital or clinic. A special MedGuide will be given to you by the pharmacist with each prescription and refill. Be sure to read this information carefully each time. Contact your care team regarding the use of this medication in children. While this medication may be prescribed for children as young as 64 years of age for selected conditions, precautions do apply. Overdosage: If you think you have taken too much of this medicine contact a poison control center or emergency room at once. NOTE: This medicine is only for you. Do not share this medicine with others. What if I miss a dose? Try not to miss a dose. Your care team will tell you when your next injection is due. Notify the office if you are unable to keep an appointment. What may interact with this medication? Medications for diabetes Medications that treat or prevent blood clots like warfarin Oxyphenbutazone Propranolol Steroid medications like prednisone or cortisone This list may not describe all possible interactions. Give your health care provider a list of all the  medicines, herbs, non-prescription drugs, or dietary supplements you use. Also tell them if you smoke, drink alcohol, or use illegal drugs. Some items may interact with your medicine. What should I watch for while using this medication? Visit your care team for regular checks on your progress. They will need to check the level of testosterone in your blood. This medication is only approved for use in men who have low levels of testosterone related to certain medical conditions. Heart attacks and strokes have been reported with the use of this medication. Notify your care team and seek emergency treatment if you develop breathing problems; changes in vision; confusion; chest pain or chest tightness; sudden arm pain; severe, sudden headache; trouble speaking or understanding; sudden numbness or weakness of the face, arm or leg; loss of balance or coordination. Talk to your care team about the risks and benefits of this medication. This medication may affect blood sugar levels. If you have diabetes, check with your care team before you change your diet or the dose of your diabetic medication. Testosterone injections are not commonly used in women. Women should inform their care team if they wish to become pregnant or think they might be pregnant. There is a potential for serious side effects to an unborn child. Talk to your care team or pharmacist for more information. Talk with your care team about your birth control options while taking this medication. This medication is banned from use in athletes by most athletic organizations. What side effects may I notice from receiving this medication? Side effects that you should report to your care team as soon as possible: Allergic reactions--skin rash, itching, hives, swelling of the  face, lips, tongue, or throat Blood clot--pain, swelling, or warmth in the leg, shortness of breath, chest pain Heart attack--pain or tightness in the chest, shoulders, arms or jaw,  nausea, shortness of breath, cold or clammy skin, feeling faint or lightheaded Increase in blood pressure Liver injury--right upper belly pain, loss of appetite, nausea, light-colored stool, dark yellow or brown urine, yellowing of the skin or eyes, unusual weakness or fatigue Mood swings, irritability, or hostility Prolonged or painful erection Sleep apnea--loud snoring, gasping during sleep, daytime sleepiness Stroke--sudden numbness or weakness of the face, arm or leg, trouble speaking, confusion, trouble walking, loss of balance or coordination, dizziness, severe headache, change in vision Swelling of the ankles, hands, or feet Thoughts of suicide or self-harm, worsening mood, feelings of depression Side effects that usually do not require medical attention (report to your care team if they continue or are bothersome): Acne Change in sex drive or performance Pain, redness, or irritation at the application site Unexpected breast tissue growth This list may not describe all possible side effects. Call your doctor for medical advice about side effects. You may report side effects to FDA at 1-800-FDA-1088. Where should I keep my medication? Keep out of the reach of children. This medication can be abused. Keep your medication in a safe place to protect it from theft. Do not share this medication with anyone. Selling or giving away this medication is dangerous and against the law. Store at room temperature between 20 and 25 degrees C (68 and 77 degrees F). Do not freeze. Protect from light. Follow the directions for the product you are prescribed. Throw away any unused medication after the expiration date. NOTE: This sheet is a summary. It may not cover all possible information. If you have questions about this medicine, talk to your doctor, pharmacist, or health care provider.  2022 Elsevier/Gold Standard (2020-08-07 00:00:00)

## 2021-08-15 ENCOUNTER — Other Ambulatory Visit: Payer: BC Managed Care – PPO

## 2021-08-22 ENCOUNTER — Other Ambulatory Visit: Payer: BC Managed Care – PPO

## 2021-08-22 ENCOUNTER — Other Ambulatory Visit: Payer: Self-pay

## 2021-08-22 DIAGNOSIS — E291 Testicular hypofunction: Secondary | ICD-10-CM

## 2021-08-22 MED ORDER — TESTOSTERONE CYPIONATE 200 MG/ML IM SOLN
120.0000 mg | INTRAMUSCULAR | 2 refills | Status: DC
Start: 2021-08-22 — End: 2022-01-22

## 2021-08-22 NOTE — Telephone Encounter (Signed)
Mail order pharmacy called to request testosterone quantity for 79ml vs 81ml. Message sent to MD to complete if appropriate.

## 2021-08-27 LAB — COMPREHENSIVE METABOLIC PANEL
ALT: 30 IU/L (ref 0–44)
AST: 20 IU/L (ref 0–40)
Albumin/Globulin Ratio: 3.2 — ABNORMAL HIGH (ref 1.2–2.2)
Albumin: 4.5 g/dL (ref 3.8–4.9)
Alkaline Phosphatase: 73 IU/L (ref 44–121)
BUN/Creatinine Ratio: 9 (ref 9–20)
BUN: 11 mg/dL (ref 6–24)
Bilirubin Total: 0.6 mg/dL (ref 0.0–1.2)
CO2: 27 mmol/L (ref 20–29)
Calcium: 9.4 mg/dL (ref 8.7–10.2)
Chloride: 100 mmol/L (ref 96–106)
Creatinine, Ser: 1.18 mg/dL (ref 0.76–1.27)
Globulin, Total: 1.4 g/dL — ABNORMAL LOW (ref 1.5–4.5)
Glucose: 156 mg/dL — ABNORMAL HIGH (ref 70–99)
Potassium: 5.5 mmol/L — ABNORMAL HIGH (ref 3.5–5.2)
Sodium: 138 mmol/L (ref 134–144)
Total Protein: 5.9 g/dL — ABNORMAL LOW (ref 6.0–8.5)
eGFR: 72 mL/min/{1.73_m2} (ref >59)

## 2021-08-27 LAB — CBC WITH DIFFERENTIAL
Basophils Absolute: 0 10*3/uL (ref 0.0–0.2)
Basos: 1 %
EOS (ABSOLUTE): 0.1 10*3/uL (ref 0.0–0.4)
Eos: 2 %
Hematocrit: 50.4 % (ref 37.5–51.0)
Hemoglobin: 17.7 g/dL (ref 13.0–17.7)
Immature Grans (Abs): 0 10*3/uL (ref 0.0–0.1)
Immature Granulocytes: 0 %
Lymphocytes Absolute: 1.2 10*3/uL (ref 0.7–3.1)
Lymphs: 26 %
MCH: 32.1 pg (ref 26.6–33.0)
MCHC: 35.1 g/dL (ref 31.5–35.7)
MCV: 91 fL (ref 79–97)
Monocytes Absolute: 0.3 10*3/uL (ref 0.1–0.9)
Monocytes: 8 %
Neutrophils Absolute: 2.9 10*3/uL (ref 1.4–7.0)
Neutrophils: 63 %
RBC: 5.52 x10E6/uL (ref 4.14–5.80)
RDW: 13.2 % (ref 11.6–15.4)
WBC: 4.6 10*3/uL (ref 3.4–10.8)

## 2021-08-27 LAB — TESTOSTERONE,FREE AND TOTAL
Testosterone, Free: 14.8 pg/mL (ref 7.2–24.0)
Testosterone: 726 ng/dL (ref 264–916)

## 2021-09-24 IMAGING — US US PELVIS LIMITED
1 series · 9 of 9 positions shown · non-contrast
Comparison: None

CLINICAL DATA: LEFT inguinal pain and swelling intermittently
question hernia

EXAM:
LIMITED ULTRASOUND OF PELVIS
TECHNIQUE: Limited transabdominal ultrasound examination of the pelvis was
performed.

[Series 1: us pelvis limited (transabdominal only) · 9 acquisitions, 9 frames shown]
[im 1/9]
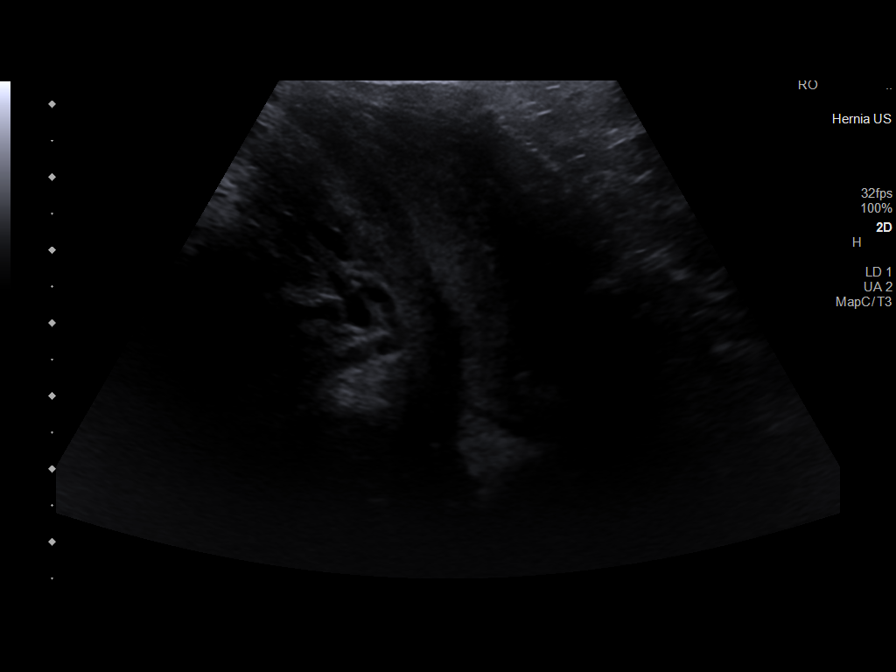
[im 2/9]
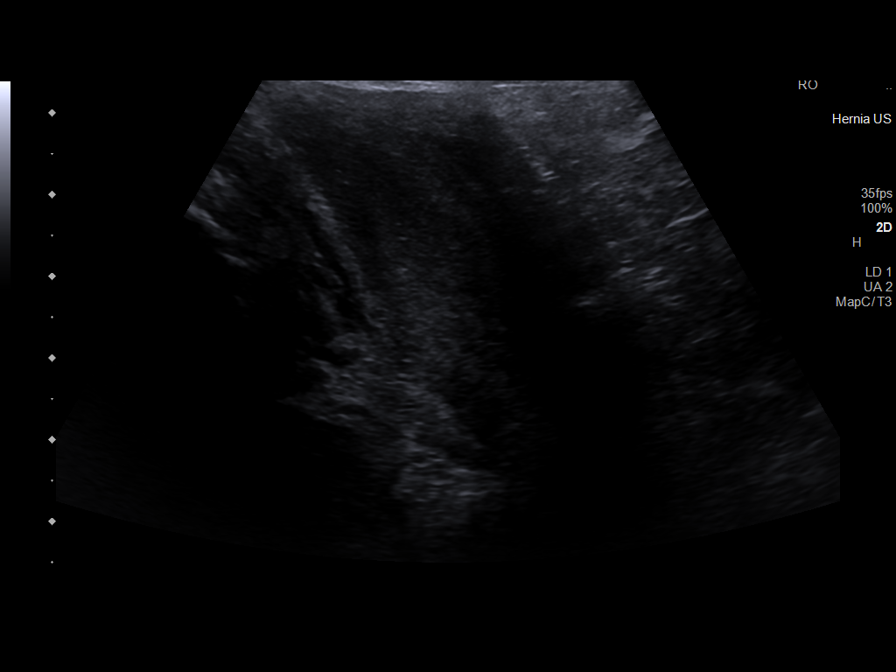
[im 3/9]
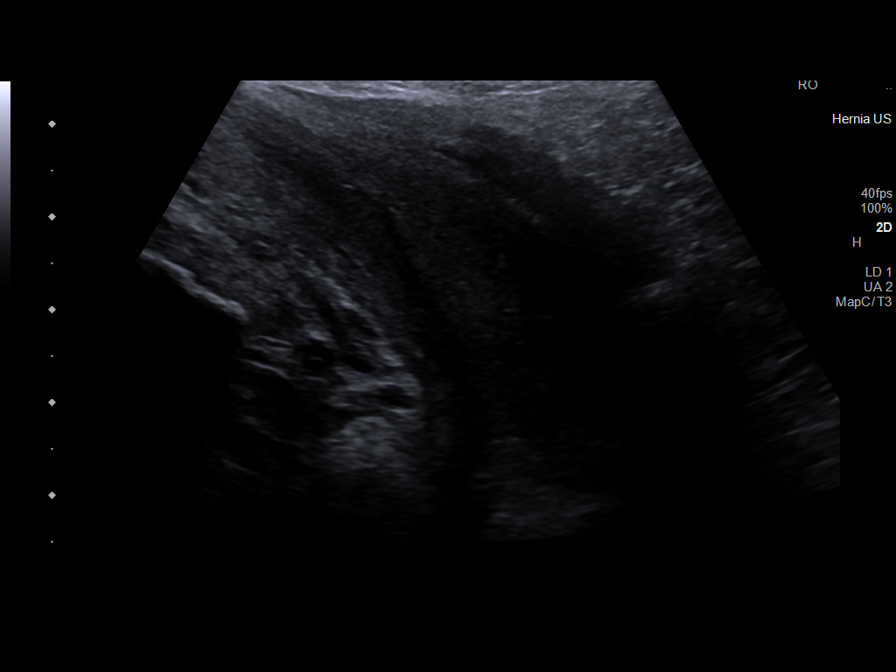
[im 4/9]
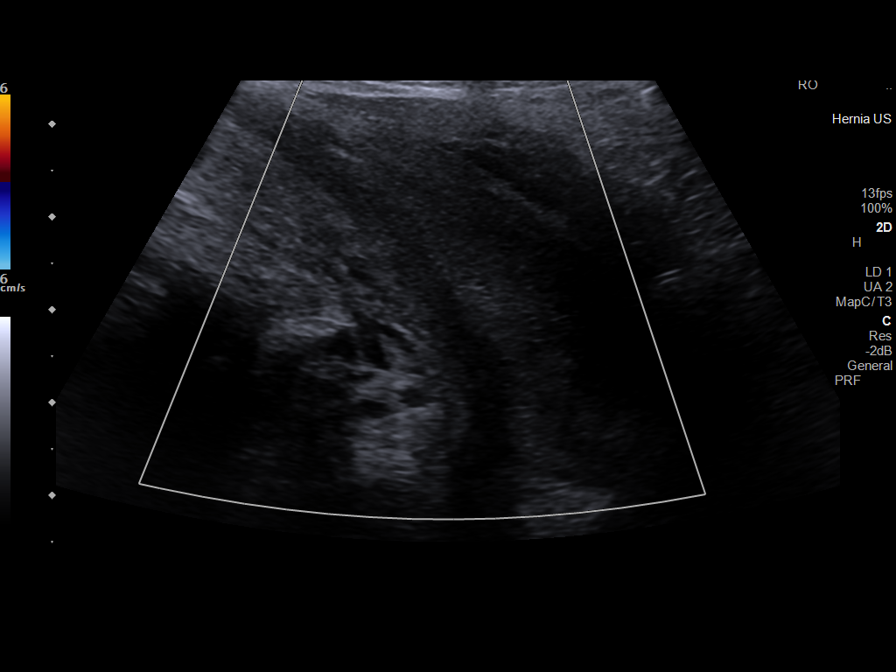
[im 5/9]
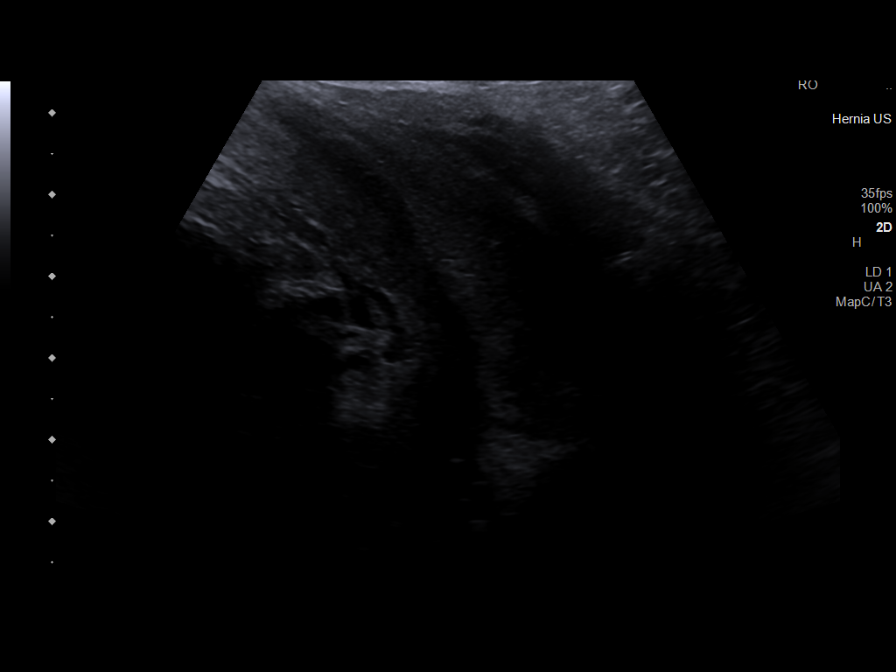
[im 6/9]
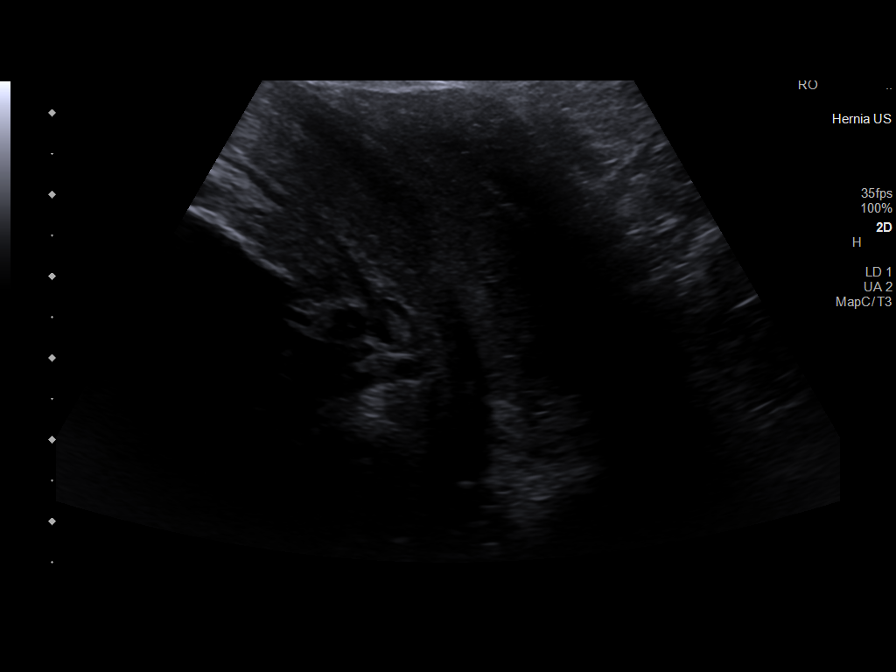
[im 7/9]
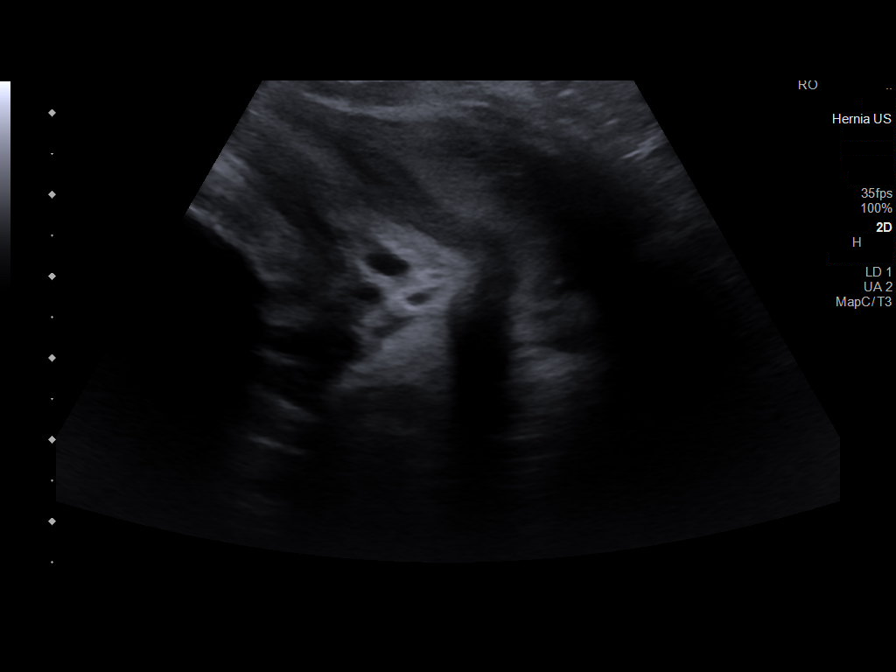
[im 8/9]
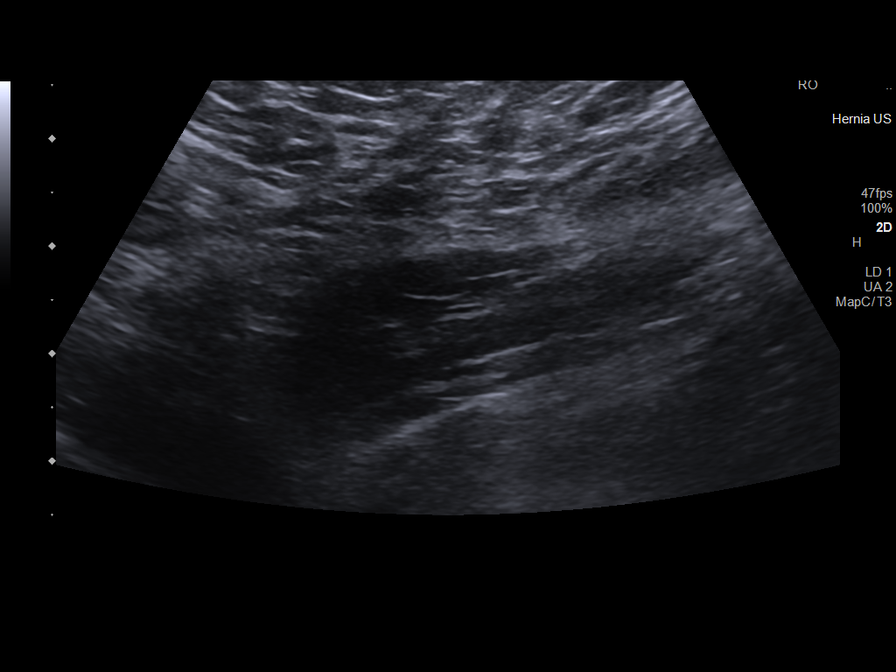
[im 9/9]
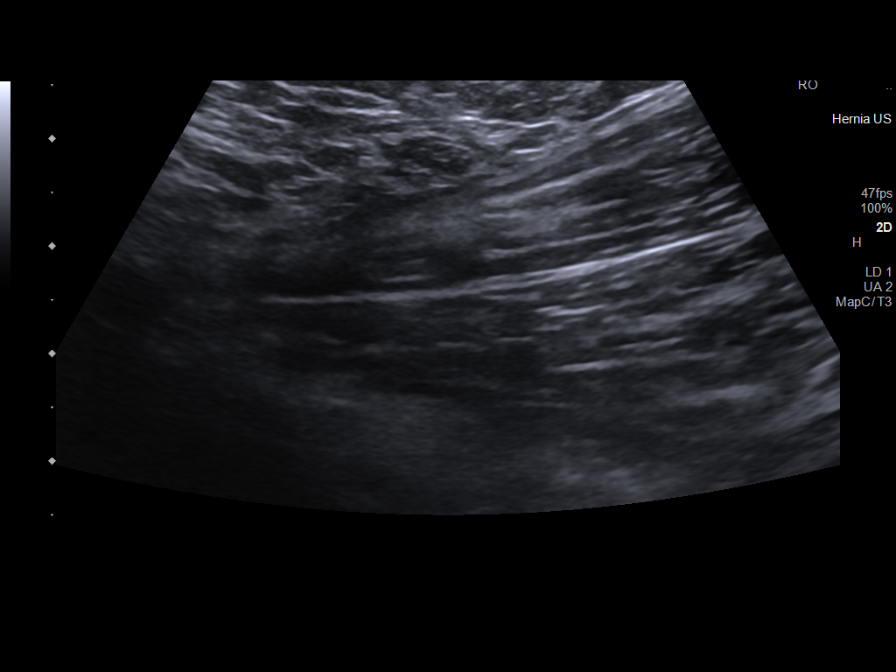

[9 of 9 positions shown; findings below may reference images not displayed]

FINDINGS: No LEFT inguinal mass or adenopathy identified.

No abnormal fluid collections.

Imaging of the LEFT inguinal canal and groin shows no evidence of
inguinal hernia.

Visualized LEFT testis at real-time imaging was unremarkable.
IMPRESSION: Negative ultrasound of the LEFT inguinal canal and LEFT groin.

Specifically no evidence of LEFT inguinal hernia is sonographically
identified.

## 2021-09-29 ENCOUNTER — Ambulatory Visit (INDEPENDENT_AMBULATORY_CARE_PROVIDER_SITE_OTHER): Payer: BC Managed Care – PPO | Admitting: Physician Assistant

## 2021-09-29 ENCOUNTER — Other Ambulatory Visit: Payer: Self-pay

## 2021-09-29 VITALS — BP 127/81 | HR 116

## 2021-09-29 DIAGNOSIS — R350 Frequency of micturition: Secondary | ICD-10-CM | POA: Insufficient documentation

## 2021-09-29 DIAGNOSIS — N401 Enlarged prostate with lower urinary tract symptoms: Secondary | ICD-10-CM

## 2021-09-29 LAB — URINALYSIS, ROUTINE W REFLEX MICROSCOPIC
Bilirubin, UA: NEGATIVE
Ketones, UA: NEGATIVE
Leukocytes,UA: NEGATIVE
Nitrite, UA: NEGATIVE
Protein,UA: NEGATIVE
RBC, UA: NEGATIVE
Specific Gravity, UA: 1.025 (ref 1.005–1.030)
Urobilinogen, Ur: 0.2 mg/dL (ref 0.2–1.0)
pH, UA: 5.5 (ref 5.0–7.5)

## 2021-09-29 MED ORDER — MIRABEGRON ER 25 MG PO TB24: 25.0000 mg | ORAL_TABLET | Freq: Every day | ORAL | 0 refills | Status: DC

## 2021-09-29 NOTE — Progress Notes (Signed)
post void residual= 20  Urological Symptom Review  Patient is experiencing the following symptoms: Frequent urination Hard to postpone urination Get up at night to urinate Leakage of urine   Review of Systems  Gastrointestinal (upper)  : Negative for upper GI symptoms  Gastrointestinal (lower) : Negative for lower GI symptoms  Constitutional : Negative for symptoms  Skin: Negative for skin symptoms  Eyes: Negative for eye symptoms  Ear/Nose/Throat : Negative for Ear/Nose/Throat symptoms  Hematologic/Lymphatic: Negative for Hematologic/Lymphatic symptoms  Cardiovascular : Negative for cardiovascular symptoms  Respiratory : Negative for respiratory symptoms  Endocrine: Negative for endocrine symptoms  Musculoskeletal: Negative for musculoskeletal symptoms  Neurological: Negative for neurological symptoms  Psychologic: Negative for psychiatric symptoms

## 2021-09-29 NOTE — Progress Notes (Signed)
Assessment: 1. Urinary frequency   2. Benign localized prostatic hyperplasia with lower urinary tract symptoms (LUTS)     Plan: Samples of Myrbetriq 25mg  given and pt will continue Rapaflo. Timed voiding discussed. He will FU in 4-6 weeks for PVR or call sooner if his sxs progress. SE of medication discussed at length. If sxs not improving, may consider PTNS. Pt will also keep FU appt with Dr. Alyson Ingles in 6 months as scheduled.  Chief Complaint: No chief complaint on file. Urinary frequency and urgency  HPI: 09/29/21 [name] is a 61 y.o. male with h/o prostate abscess who presents for evaluation of urinary frequency Q 2 hours, urgency, incontinence, and nocturia x 4x/night. Pt has been on Rapaflo and doing well since last visit, but started having LUTS 1-2 weeks ago. No fever, chills, bodyaches. No dysuria, burning, hematuria  IPSS=17, QOL=6 UA clear PVR= 23ml  08/08/21 [name] is a 61yo here for followup for prostate abscess, BPH and erectile dysfunction. No recent testosterone labs. CT pelvis 07/25/2021 shows resolution of the prostatic abscess. He has stable mild LUTS rapaflo 8mg  daily. IPSS 13 QOL 2. Nocturia 1-2x. He uses tadalafil 20mg  PRN for his erectile dysfunction which works well.   Portions of the above documentation were copied from a prior visit for review purposes only.  Allergies: Allergies  Allergen Reactions   Ciprofloxacin Itching   Contrast Media [Iodinated Contrast Media] Itching    Gets hot   Erythromycin     Stomach pain   Ioxaglate Itching   Metrizamide Itching   Codeine Itching   Hydrocodone-Acetaminophen Itching   Metformin And Related Diarrhea    The plain metformin caused severe diarrhea but the ER version doesnt    PMH: Past Medical History:  Diagnosis Date   BPH (benign prostatic hyperplasia)    Diabetes mellitus without complication (HCC)    Fatty liver    GERD (gastroesophageal reflux disease)    no meds   History of  hiatal hernia    Hyperlipidemia    Hypertension    Low testosterone    Medical history non-contributory    Polycythemia    Sleep apnea    uses a cpap nightly    PSH: Past Surgical History:  Procedure Laterality Date   COLONOSCOPY     I & D EXTREMITY Left 06/20/2013   Procedure: MINOR IRRIGATION AND DEBRIDEMENT LEFT INDEX FINGER;  Surgeon: Tennis Must, MD;  Location: [facility];  Service: Orthopedics;  Laterality: Left;   NASAL SINUS SURGERY     UPPER GASTROINTESTINAL ENDOSCOPY     WISDOM TOOTH EXTRACTION      SH: Social History   Tobacco Use   Smoking status: Never   Smokeless tobacco: Never  Vaping Use   Vaping Use: Never used  Substance Use Topics   Alcohol use: Yes    Comment: occ   Drug use: No    ROS: Constitutional:  Negative for fever, chills, weight loss CV: Negative for chest pain, previous MI, hypertension Respiratory:  Negative for shortness of breath, wheezing, sleep apnea, frequent cough GI:  Negative for nausea, vomiting, bloody stool, GERD  PE: BP 127/81    Pulse (!) 116  GENERAL APPEARANCE:  Well appearing, well developed, well nourished, NAD HEENT:  Atraumatic, normocephalic NECK:  Supple. Trachea midline ABDOMEN:  Soft, non-tender, no masses GU: Prostate exam- base of gland non-tender without induration or mass. Enlargement noted. Cannot reach apex of gland. EXTREMITIES:  Moves all extremities well,  without clubbing, cyanosis, or edema NEUROLOGIC:  Alert and oriented x 3, normal gait, CN II-XII grossly intact MENTAL STATUS:  appropriate BACK:  Non-tender to palpation, No CVAT SKIN:  Warm, dry, and intact   Results: Laboratory Data: Lab Results  Component Value Date   WBC 4.6 08/22/2021   HGB 17.7 08/22/2021   HCT 50.4 08/22/2021   MCV 91 08/22/2021   PLT 169 05/27/2021    Lab Results  Component Value Date   CREATININE 1.18 08/22/2021    No results found for: PSA  Lab Results  Component Value Date    TESTOSTERONE 726 08/22/2021    Lab Results  Component Value Date   HGBA1C 6.4 (H) 05/22/2021    Urinalysis    Component Value Date/Time   COLORURINE AMBER (A) 05/22/2021 0833   APPEARANCEUR Clear 08/08/2021 1006   LABSPEC 1.032 (H) 05/22/2021 0833   LABSPEC 1.015 11/03/2013 1218   PHURINE 5.0 05/22/2021 0833   GLUCOSEU 1+ (A) 08/08/2021 1006   GLUCOSEU NEGATIVE 11/03/2013 1218   HGBUR MODERATE (A) 05/22/2021 0833   BILIRUBINUR Negative 08/08/2021 1006   BILIRUBINUR Negative 11/13/2011 1910   KETONESUR 5 (A) 05/22/2021 0833   PROTEINUR Trace (A) 08/08/2021 1006   PROTEINUR >=300 (A) 05/22/2021 0833   UROBILINOGEN 2.0 (H) 08/23/2014 2100   NITRITE Negative 08/08/2021 1006   NITRITE NEGATIVE 05/22/2021 0833   LEUKOCYTESUR Negative 08/08/2021 1006   LEUKOCYTESUR MODERATE (A) 05/22/2021 0833   LEUKOCYTESUR Negative 11/13/2011 1910    Lab Results  Component Value Date   LABMICR Comment 08/08/2021   BACTERIA MANY (A) 05/22/2021    Pertinent Imaging:   Results for orders placed during the hospital encounter of 05/22/21  CT Renal Stone Study  Narrative CLINICAL DATA:  Bilateral flank pain.  Dysuria.  EXAM: CT ABDOMEN AND PELVIS WITHOUT CONTRAST  TECHNIQUE: Multidetector CT imaging of the abdomen and pelvis was performed following the standard protocol without IV contrast.  COMPARISON:  01/13/2021  FINDINGS: Lower chest: Clear lung bases. Normal heart size without pericardial or pleural effusion.  Hepatobiliary: Moderate hepatic steatosis. Normal gallbladder, without biliary ductal dilatation.  Pancreas: Normal, without mass or ductal dilatation.  Spleen: Normal in size, without focal abnormality.  Adrenals/Urinary Tract: Normal adrenal glands. Punctate interpolar left renal collecting system calculus. No right renal calculi. No hydronephrosis. No hydroureter or ureteric calculi. No bladder calculi.  Stomach/Bowel: Small bowel malrotation again  identified. Normal position of the ileocecal junction. Appendix is not visualized but there is no evidence of right lower quadrant inflammation. Otherwise normal small bowel.  Vascular/Lymphatic: Aortic atherosclerosis. No abdominopelvic adenopathy.  Reproductive: Mild prostatomegaly.  Other: No significant free fluid.  No free intraperitoneal air.  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 Lower thoracic and less so upper lumbar spondylosis.  IMPRESSION: 1. Left nephrolithiasis. No obstructive uropathy. 2. Hepatic steatosis. 3. Small bowel malrotation, as before. 4. Prostatomegaly. 5. Aortic Atherosclerosis (ICD10-I70.0).   Electronically Signed By: Abigail Miyamoto M.D. On: 05/22/2021 10:35  No results found for this or any previous visit (from the past 24 hour(s)).

## 2021-10-15 ENCOUNTER — Other Ambulatory Visit: Payer: Self-pay

## 2021-10-15 ENCOUNTER — Encounter: Payer: Self-pay | Admitting: Urology

## 2021-10-15 ENCOUNTER — Ambulatory Visit (INDEPENDENT_AMBULATORY_CARE_PROVIDER_SITE_OTHER): Payer: BC Managed Care – PPO | Admitting: Urology

## 2021-10-15 VITALS — BP 145/95 | HR 98

## 2021-10-15 DIAGNOSIS — R350 Frequency of micturition: Secondary | ICD-10-CM | POA: Diagnosis not present

## 2021-10-15 DIAGNOSIS — R351 Nocturia: Secondary | ICD-10-CM

## 2021-10-15 DIAGNOSIS — N401 Enlarged prostate with lower urinary tract symptoms: Secondary | ICD-10-CM

## 2021-10-15 LAB — URINALYSIS, ROUTINE W REFLEX MICROSCOPIC
Bilirubin, UA: NEGATIVE
Leukocytes,UA: NEGATIVE
Nitrite, UA: NEGATIVE
RBC, UA: NEGATIVE
Specific Gravity, UA: 1.025 (ref 1.005–1.030)
Urobilinogen, Ur: 0.2 mg/dL (ref 0.2–1.0)
pH, UA: 5.5 (ref 5.0–7.5)

## 2021-10-15 LAB — MICROSCOPIC EXAMINATION
Bacteria, UA: NONE SEEN
RBC, Urine: NONE SEEN /hpf (ref 0–2)
Renal Epithel, UA: NONE SEEN /hpf
WBC, UA: NONE SEEN /hpf (ref 0–5)

## 2021-10-15 MED ORDER — GEMTESA 75 MG PO TABS: 1.0000 | ORAL_TABLET | Freq: Every day | ORAL | 0 refills | Status: DC

## 2021-10-15 MED ORDER — SILODOSIN 8 MG PO CAPS: 8.0000 mg | ORAL_CAPSULE | Freq: Every day | ORAL | 11 refills | Status: DC

## 2021-10-15 NOTE — Patient Instructions (Signed)

## 2021-10-19 NOTE — Progress Notes (Signed)
10/15/2021 7:16 PM   [name] 10/31/1960 [id number]  Referring provider: Dion Body, MD [facility] [place],  [place] [id number]  Urinary frequency   HPI: [name] is a 61yo here for followup for BPh with urinary frequency. He was 2/6 with increasing urinary frequency and was started on mirabegron 25mg . This failed to improve his urinary frequency and his blood pressure has increased while on the mirabegron. IPSS 15 QOL 4. Urinary frequency is every 45-60 minutes. Nocturia 3-4x. No feeling of incomplete emptying. No straining to urinate. Urine stream strong on rapaflo 8mg . No other complaints today   PMH: Past Medical History:  Diagnosis Date   BPH (benign prostatic hyperplasia)    Diabetes mellitus without complication (HCC)    Fatty liver    GERD (gastroesophageal reflux disease)    no meds   History of hiatal hernia    Hyperlipidemia    Hypertension    Low testosterone    Medical history non-contributory    Polycythemia    Sleep apnea    uses a cpap nightly    Surgical History: Past Surgical History:  Procedure Laterality Date   COLONOSCOPY     I & D EXTREMITY Left 06/20/2013   Procedure: MINOR IRRIGATION AND DEBRIDEMENT LEFT INDEX FINGER;  Surgeon: Tennis Must, MD;  Location: [facility];  Service: Orthopedics;  Laterality: Left;   NASAL SINUS SURGERY     UPPER GASTROINTESTINAL ENDOSCOPY     WISDOM TOOTH EXTRACTION      Home Medications:  Allergies as of 10/15/2021       Reactions   Ciprofloxacin Itching   Contrast Media [iodinated Contrast Media] Itching   Gets hot   Erythromycin    Stomach pain   Ioxaglate Itching   Metrizamide Itching   Codeine Itching   Hydrocodone-acetaminophen Itching   Metformin And Related Diarrhea   The plain metformin caused severe diarrhea but the ER version doesnt        Medication List        Accurate as of October 15, 2021 11:59 PM. If you have any  questions, ask your nurse or doctor.          STOP taking these medications    mirabegron ER 25 MG Tb24 tablet Commonly known as: MYRBETRIQ Stopped by: Nicolette Bang, MD       TAKE these medications    acetaminophen 500 MG tablet Commonly known as: TYLENOL Take 1,000 mg by mouth every 8 (eight) hours as needed for moderate pain or headache.   albuterol 108 (90 Base) MCG/ACT inhaler Commonly known as: VENTOLIN HFA Inhale 2 puffs into the lungs every 4 (four) hours as needed for wheezing or shortness of breath.   aspirin 81 MG EC tablet Take 162 mg by mouth daily.   azelastine 0.1 % nasal spray Commonly known as: ASTELIN Place 1 spray into both nostrils 2 (two) times daily as needed for allergies.   Calcium 250 MG Caps Take 250 mg by mouth daily.   cetirizine 10 MG tablet Commonly known as: ZYRTEC Take 10 mg by mouth daily as needed for allergies.   CoQ10 200 MG Caps Take 200 mg by mouth daily.   cyclobenzaprine 10 MG tablet Commonly known as: FLEXERIL Take 1 tablet by mouth every 8 (eight) hours as needed.   Dialyvite Vitamin D 5000 125 MCG (5000 UT) capsule Generic drug: Cholecalciferol Take 5,000 Units by mouth daily.   diphenhydrAMINE 25 MG  tablet Commonly known as: Benadryl Allergy Take 1 tablet (25 mg total) by mouth once for 1 dose. Benadryl 1 hour prior to scan   fluticasone 50 MCG/ACT nasal spray Commonly known as: FLONASE Place 1 spray into the nose daily as needed for allergies.   Gemtesa 75 MG Tabs Generic drug: Vibegron Take 1 capsule by mouth daily. Started by: Nicolette Bang, MD   guaiFENesin 600 MG 12 hr tablet Commonly known as: MUCINEX Take 600 mg by mouth every 12 (twelve) hours as needed (congestion).   ibuprofen 200 MG tablet Commonly known as: ADVIL Take 400 mg by mouth every 6 (six) hours as needed for headache or moderate pain.   lisinopril 20 MG tablet Commonly known as: ZESTRIL Take 20 mg by mouth daily.    lovastatin 20 MG tablet Commonly known as: MEVACOR Take 20 mg by mouth at bedtime.   magnesium oxide 400 MG tablet Commonly known as: MAG-OX Take 400 mg by mouth every Monday, Wednesday, and Friday.   metFORMIN 500 MG 24 hr tablet Commonly known as: GLUCOPHAGE-XR Take 500 mg by mouth 2 (two) times daily.   montelukast 10 MG tablet Commonly known as: SINGULAIR Take 10 mg by mouth at bedtime.   multivitamin tablet Take 1 tablet by mouth daily.   Potassium 99 MG Tabs Take 99 mg by mouth daily.   PROBIOTIC ADVANCED PO Take 1 tablet by mouth daily.   pseudoephedrine 30 MG tablet Commonly known as: SUDAFED Take 30 mg by mouth every 4 (four) hours as needed for congestion.   silodosin 8 MG Caps capsule Commonly known as: RAPAFLO Take 1 capsule (8 mg total) by mouth at bedtime.   tadalafil 20 MG tablet Commonly known as: CIALIS Take 1 tablet (20 mg total) by mouth daily as needed for erectile dysfunction.   tamsulosin 0.4 MG Caps capsule Commonly known as: FLOMAX TAKE 1 CAPSULE BY MOUTH AT BEDTIME   testosterone cypionate 200 MG/ML injection Commonly known as: DEPOTESTOSTERONE CYPIONATE Inject 0.6 mLs (120 mg total) into the muscle every 7 (seven) days.   vitamin B-12 1000 MCG tablet Commonly known as: CYANOCOBALAMIN Take 1,000 mcg by mouth daily.        Allergies:  Allergies  Allergen Reactions   Ciprofloxacin Itching   Contrast Media [Iodinated Contrast Media] Itching    Gets hot   Erythromycin     Stomach pain   Ioxaglate Itching   Metrizamide Itching   Codeine Itching   Hydrocodone-Acetaminophen Itching   Metformin And Related Diarrhea    The plain metformin caused severe diarrhea but the ER version doesnt    Family History: Family History  Problem Relation Age of Onset   Colon cancer Father     Social History:  reports that he has never smoked. He has never used smokeless tobacco. He reports current alcohol use. He reports that he does not use  drugs.  ROS: All other review of systems were reviewed and are negative except what is noted above in HPI  Physical Exam: BP (!) 145/95    Pulse 98   Constitutional:  Alert and oriented, No acute distress. HEENT: [place] AT, moist mucus membranes.  Trachea midline, no masses. Cardiovascular: No clubbing, cyanosis, or edema. Respiratory: Normal respiratory effort, no increased work of breathing. GI: Abdomen is soft, nontender, nondistended, no abdominal masses GU: No CVA tenderness.  Lymph: No cervical or inguinal lymphadenopathy. Skin: No rashes, bruises or suspicious lesions. Neurologic: Grossly intact, no focal deficits, moving all 4 extremities. Psychiatric:  Normal mood and affect.  Laboratory Data: Lab Results  Component Value Date   WBC 4.6 08/22/2021   HGB 17.7 08/22/2021   HCT 50.4 08/22/2021   MCV 91 08/22/2021   PLT 169 05/27/2021    Lab Results  Component Value Date   CREATININE 1.18 08/22/2021    No results found for: PSA  Lab Results  Component Value Date   TESTOSTERONE 726 08/22/2021    Lab Results  Component Value Date   HGBA1C 6.4 (H) 05/22/2021    Urinalysis    Component Value Date/Time   COLORURINE AMBER (A) 05/22/2021 0833   APPEARANCEUR Clear 10/15/2021 1605   LABSPEC 1.032 (H) 05/22/2021 0833   LABSPEC 1.015 11/03/2013 1218   PHURINE 5.0 05/22/2021 0833   GLUCOSEU Trace (A) 10/15/2021 1605   GLUCOSEU NEGATIVE 11/03/2013 1218   HGBUR MODERATE (A) 05/22/2021 0833   BILIRUBINUR Negative 10/15/2021 1605   BILIRUBINUR Negative 11/13/2011 1910   KETONESUR 5 (A) 05/22/2021 0833   PROTEINUR 1+ (A) 10/15/2021 1605   PROTEINUR >=300 (A) 05/22/2021 0833   UROBILINOGEN 2.0 (H) 08/23/2014 2100   NITRITE Negative 10/15/2021 1605   NITRITE NEGATIVE 05/22/2021 0833   LEUKOCYTESUR Negative 10/15/2021 1605   LEUKOCYTESUR MODERATE (A) 05/22/2021 0833   LEUKOCYTESUR Negative 11/13/2011 1910    Lab Results  Component Value Date   LABMICR See below:  10/15/2021   WBCUA None seen 10/15/2021   LABEPIT 0-10 10/15/2021   MUCUS Present 10/15/2021   BACTERIA None seen 10/15/2021    Pertinent Imaging:  No results found for this or any previous visit.  No results found for this or any previous visit.  No results found for this or any previous visit.  No results found for this or any previous visit.  No results found for this or any previous visit.  No results found for this or any previous visit.  No results found for this or any previous visit.  Results for orders placed during the hospital encounter of 05/22/21  CT Renal Stone Study  Narrative CLINICAL DATA:  Bilateral flank pain.  Dysuria.  EXAM: CT ABDOMEN AND PELVIS WITHOUT CONTRAST  TECHNIQUE: Multidetector CT imaging of the abdomen and pelvis was performed following the standard protocol without IV contrast.  COMPARISON:  01/13/2021  FINDINGS: Lower chest: Clear lung bases. Normal heart size without pericardial or pleural effusion.  Hepatobiliary: Moderate hepatic steatosis. Normal gallbladder, without biliary ductal dilatation.  Pancreas: Normal, without mass or ductal dilatation.  Spleen: Normal in size, without focal abnormality.  Adrenals/Urinary Tract: Normal adrenal glands. Punctate interpolar left renal collecting system calculus. No right renal calculi. No hydronephrosis. No hydroureter or ureteric calculi. No bladder calculi.  Stomach/Bowel: Small bowel malrotation again identified. Normal position of the ileocecal junction. Appendix is not visualized but there is no evidence of right lower quadrant inflammation. Otherwise normal small bowel.  Vascular/Lymphatic: Aortic atherosclerosis. No abdominopelvic adenopathy.  Reproductive: Mild prostatomegaly.  Other: No significant free fluid.  No free intraperitoneal air.  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 Lower thoracic and less so upper lumbar  spondylosis.  IMPRESSION: 1. Left nephrolithiasis. No obstructive uropathy. 2. Hepatic steatosis. 3. Small bowel malrotation, as before. 4. Prostatomegaly. 5. Aortic Atherosclerosis (ICD10-I70.0).   Electronically Signed By: Abigail Miyamoto M.D. On: 05/22/2021 10:35   Assessment & Plan:    1. Urinary frequency -We will trial gemtesa 75mg  daily - Urinalysis, Routine w reflex microscopic  2. Benign localized prostatic hyperplasia with lower urinary tract symptoms (LUTS) -  Continue rapaflo 8mg  daily - Urinalysis, Routine w reflex microscopic  3. Nocturia -Continue rapaflo 8mg  daily   Return in about 4 weeks (around 11/12/2021).  Nicolette Bang, MD  [facility]

## 2021-11-03 ENCOUNTER — Ambulatory Visit: Payer: BC Managed Care – PPO | Admitting: Physician Assistant

## 2021-11-21 ENCOUNTER — Ambulatory Visit (INDEPENDENT_AMBULATORY_CARE_PROVIDER_SITE_OTHER): Payer: BC Managed Care – PPO | Admitting: Urology

## 2021-11-21 ENCOUNTER — Encounter: Payer: Self-pay | Admitting: Urology

## 2021-11-21 VITALS — BP 109/73 | HR 86

## 2021-11-21 DIAGNOSIS — R351 Nocturia: Secondary | ICD-10-CM

## 2021-11-21 DIAGNOSIS — E291 Testicular hypofunction: Secondary | ICD-10-CM

## 2021-11-21 DIAGNOSIS — R350 Frequency of micturition: Secondary | ICD-10-CM

## 2021-11-21 DIAGNOSIS — N401 Enlarged prostate with lower urinary tract symptoms: Secondary | ICD-10-CM | POA: Diagnosis not present

## 2021-11-21 LAB — URINALYSIS, ROUTINE W REFLEX MICROSCOPIC
Bilirubin, UA: NEGATIVE
Leukocytes,UA: NEGATIVE
Nitrite, UA: NEGATIVE
RBC, UA: NEGATIVE
Specific Gravity, UA: >1.03 — ABNORMAL HIGH (ref 1.005–1.030)
Urobilinogen, Ur: 0.2 mg/dL (ref 0.2–1.0)
pH, UA: 5.5 (ref 5.0–7.5)

## 2021-11-21 LAB — BLADDER SCAN AMB NON-IMAGING: Scan Result: 20

## 2021-11-21 MED ORDER — GEMTESA 75 MG PO TABS: 1.0000 | ORAL_TABLET | Freq: Every day | ORAL | 11 refills | Status: DC

## 2021-11-21 MED ORDER — SILODOSIN 8 MG PO CAPS: 8.0000 mg | ORAL_CAPSULE | Freq: Every day | ORAL | 11 refills | Status: DC

## 2021-11-21 NOTE — Progress Notes (Signed)
? ?11/21/2021 ?10:20 AM  ? ?[name] ?Dec 09, 1960 ?[id number] ? ?Referring provider: Dion Body, MD ?[place] ?[facility] ?[place],  [place] [address] ? ?Followup BPH and urinary frequency ? ? ?HPI: ?[name] is a 61yo here for followup for BPH with urinary frequency.,  ?IPSS 7 QOl 1 on rapaflo '8mg'$  daily and gemtesa '75mg'$  daily. He had diarrhea and hypertension while taking mirabegron. Gemtesa signifciantly improved his urinary frequency. Urinary frequency improved from every 30-45 minutes to 4-5 hours. Nocturia 1x. No other complaints today ? ?PMH: ?Past Medical History:  ?Diagnosis Date  ? BPH (benign prostatic hyperplasia)   ? Diabetes mellitus without complication ([place])   ? Fatty liver   ? GERD (gastroesophageal reflux disease)   ? no meds  ? History of hiatal hernia   ? Hyperlipidemia   ? Hypertension   ? Low testosterone   ? Medical history non-contributory   ? Polycythemia   ? Sleep apnea   ? uses a cpap nightly  ? ? ?Surgical History: ?Past Surgical History:  ?Procedure Laterality Date  ? COLONOSCOPY    ? I & D EXTREMITY Left 06/20/2013  ? Procedure: MINOR IRRIGATION AND DEBRIDEMENT LEFT INDEX FINGER;  Surgeon: Tennis Must, MD;  Location: [facility];  Service: Orthopedics;  Laterality: Left;  ? NASAL SINUS SURGERY    ? UPPER GASTROINTESTINAL ENDOSCOPY    ? WISDOM TOOTH EXTRACTION    ? ? ?Home Medications:  ?Allergies as of 11/21/2021   ? ?   Reactions  ? Ciprofloxacin Itching  ? Contrast Media [iodinated Contrast Media] Itching  ? Gets hot  ? Erythromycin   ? Stomach pain  ? Ioxaglate Itching  ? Metrizamide Itching  ? Codeine Itching  ? Hydrocodone-acetaminophen Itching  ? Metformin And Related Diarrhea  ? The plain metformin caused severe diarrhea but the ER version doesnt  ? ?  ? ?  ?Medication List  ?  ? ?  ? Accurate as of November 21, 2021 10:20 AM. If you have any questions, ask your nurse or doctor.  ?  ?  ? ?  ? ?acetaminophen 500 MG tablet ?Commonly known  as: TYLENOL ?Take 1,000 mg by mouth every 8 (eight) hours as needed for moderate pain or headache. ?  ?albuterol 108 (90 Base) MCG/ACT inhaler ?Commonly known as: VENTOLIN HFA ?Inhale 2 puffs into the lungs every 4 (four) hours as needed for wheezing or shortness of breath. ?  ?aspirin 81 MG EC tablet ?Take 162 mg by mouth daily. ?  ?azelastine 0.1 % nasal spray ?Commonly known as: ASTELIN ?Place 1 spray into both nostrils 2 (two) times daily as needed for allergies. ?  ?Calcium 250 MG Caps ?Take 250 mg by mouth daily. ?  ?cetirizine 10 MG tablet ?Commonly known as: ZYRTEC ?Take 10 mg by mouth daily as needed for allergies. ?  ?CoQ10 200 MG Caps ?Take 200 mg by mouth daily. ?  ?cyclobenzaprine 10 MG tablet ?Commonly known as: FLEXERIL ?Take 1 tablet by mouth every 8 (eight) hours as needed. ?  ?Dialyvite Vitamin D 5000 125 MCG (5000 UT) capsule ?Generic drug: Cholecalciferol ?Take 5,000 Units by mouth daily. ?  ?diphenhydrAMINE 25 MG tablet ?Commonly known as: Benadryl Allergy ?Take 1 tablet (25 mg total) by mouth once for 1 dose. Benadryl 1 hour prior to scan ?  ?fluticasone 50 MCG/ACT nasal spray ?Commonly known as: FLONASE ?Place 1 spray into the nose daily as needed for allergies. ?  ?Gemtesa 75  MG Tabs ?Generic drug: Vibegron ?Take 1 capsule by mouth daily. ?  ?guaiFENesin 600 MG 12 hr tablet ?Commonly known as: [place] ?Take 600 mg by mouth every 12 (twelve) hours as needed (congestion). ?  ?ibuprofen 200 MG tablet ?Commonly known as: ADVIL ?Take 400 mg by mouth every 6 (six) hours as needed for headache or moderate pain. ?  ?lisinopril 20 MG tablet ?Commonly known as: ZESTRIL ?Take 20 mg by mouth daily. ?  ?lovastatin 20 MG tablet ?Commonly known as: MEVACOR ?Take 20 mg by mouth at bedtime. ?  ?magnesium oxide 400 MG tablet ?Commonly known as: MAG-OX ?Take 400 mg by mouth every Monday, Wednesday, and Friday. ?  ?metFORMIN 500 MG 24 hr tablet ?Commonly known as: GLUCOPHAGE-XR ?Take 500 mg by mouth 2 (two)  times daily. ?  ?montelukast 10 MG tablet ?Commonly known as: SINGULAIR ?Take 10 mg by mouth at bedtime. ?  ?multivitamin tablet ?Take 1 tablet by mouth daily. ?  ?Potassium 99 MG Tabs ?Take 99 mg by mouth daily. ?  ?PROBIOTIC ADVANCED PO ?Take 1 tablet by mouth daily. ?  ?pseudoephedrine 30 MG tablet ?Commonly known as: SUDAFED ?Take 30 mg by mouth every 4 (four) hours as needed for congestion. ?  ?silodosin 8 MG Caps capsule ?Commonly known as: RAPAFLO ?Take 1 capsule (8 mg total) by mouth at bedtime. ?  ?tadalafil 20 MG tablet ?Commonly known as: CIALIS ?Take 1 tablet (20 mg total) by mouth daily as needed for erectile dysfunction. ?  ?tamsulosin 0.4 MG Caps capsule ?Commonly known as: FLOMAX ?TAKE 1 CAPSULE BY MOUTH AT BEDTIME ?  ?testosterone cypionate 200 MG/ML injection ?Commonly known as: DEPOTESTOSTERONE CYPIONATE ?Inject 0.6 mLs (120 mg total) into the muscle every 7 (seven) days. ?  ?vitamin B-12 1000 MCG tablet ?Commonly known as: CYANOCOBALAMIN ?Take 1,000 mcg by mouth daily. ?  ? ?  ? ? ?Allergies:  ?Allergies  ?Allergen Reactions  ? Ciprofloxacin Itching  ? Contrast Media [Iodinated Contrast Media] Itching  ?  Gets hot  ? Erythromycin   ?  Stomach pain  ? Ioxaglate Itching  ? Metrizamide Itching  ? Codeine Itching  ? Hydrocodone-Acetaminophen Itching  ? Metformin And Related Diarrhea  ?  The plain metformin caused severe diarrhea but the ER version doesnt  ? ? ?Family History: ?Family History  ?Problem Relation Age of Onset  ? Colon cancer Father   ? ? ?Social History:  reports that he has never smoked. He has never used smokeless tobacco. He reports current alcohol use. He reports that he does not use drugs. ? ?ROS: ?All other review of systems were reviewed and are negative except what is noted above in HPI ? ?Physical Exam: ?BP 109/73   Pulse 86   ?Constitutional:  Alert and oriented, No acute distress. ?HEENT: [place] AT, moist mucus membranes.  Trachea midline, no masses. ?Cardiovascular: No  clubbing, cyanosis, or edema. ?Respiratory: Normal respiratory effort, no increased work of breathing. ?GI: Abdomen is soft, nontender, nondistended, no abdominal masses ?GU: No CVA tenderness.  ?Lymph: No cervical or inguinal lymphadenopathy. ?Skin: No rashes, bruises or suspicious lesions. ?Neurologic: Grossly intact, no focal deficits, moving all 4 extremities. ?Psychiatric: Normal mood and affect. ? ?Laboratory Data: ?Lab Results  ?Component Value Date  ? WBC 4.6 08/22/2021  ? HGB 17.7 08/22/2021  ? HCT 50.4 08/22/2021  ? MCV 91 08/22/2021  ? PLT 169 05/27/2021  ? ? ?Lab Results  ?Component Value Date  ? CREATININE 1.18 08/22/2021  ? ? ?No results  found for: PSA ? ?Lab Results  ?Component Value Date  ? TESTOSTERONE 726 08/22/2021  ? ? ?Lab Results  ?Component Value Date  ? HGBA1C 6.4 (H) 05/22/2021  ? ? ?Urinalysis ?   ?Component Value Date/Time  ? COLORURINE AMBER (A) 05/22/2021 6384  ? APPEARANCEUR Clear 10/15/2021 1605  ? LABSPEC 1.032 (H) 05/22/2021 6659  ? LABSPEC 1.015 11/03/2013 1218  ? PHURINE 5.0 05/22/2021 0833  ? GLUCOSEU Trace (A) 10/15/2021 1605  ? [place] NEGATIVE 11/03/2013 1218  ? HGBUR MODERATE (A) 05/22/2021 0833  ? BILIRUBINUR Negative 10/15/2021 1605  ? BILIRUBINUR Negative 11/13/2011 1910  ? KETONESUR 5 (A) 05/22/2021 9357  ? PROTEINUR 1+ (A) 10/15/2021 1605  ? PROTEINUR >=300 (A) 05/22/2021 0177  ? UROBILINOGEN 2.0 (H) 08/23/2014 2100  ? NITRITE Negative 10/15/2021 1605  ? NITRITE NEGATIVE 05/22/2021 0833  ? LEUKOCYTESUR Negative 10/15/2021 1605  ? LEUKOCYTESUR MODERATE (A) 05/22/2021 9390  ? LEUKOCYTESUR Negative 11/13/2011 1910  ? ? ?Lab Results  ?Component Value Date  ? LABMICR See below: 10/15/2021  ? [place] None seen 10/15/2021  ? LABEPIT 0-10 10/15/2021  ? MUCUS Present 10/15/2021  ? BACTERIA None seen 10/15/2021  ? ? ?Pertinent Imaging: ? ?No results found for this or any previous visit. ? ?No results found for this or any previous visit. ? ?No results found for this or any previous  visit. ? ?No results found for this or any previous visit. ? ?No results found for this or any previous visit. ? ?No results found for this or any previous visit. ? ?No results found for this or any previous visit

## 2021-11-21 NOTE — Patient Instructions (Signed)

## 2021-12-15 ENCOUNTER — Telehealth: Payer: Self-pay

## 2021-12-15 NOTE — Telephone Encounter (Signed)
Patient called requesting a PA for the Gemtesa rx be sent to his pharmacy.  He also requested more samples of the rx until approved.  PA sent to pharmacy as requested and samples left at front desk for patient to pick up.  ?

## 2022-01-14 ENCOUNTER — Other Ambulatory Visit: Payer: Self-pay | Admitting: Urology

## 2022-01-14 DIAGNOSIS — E291 Testicular hypofunction: Secondary | ICD-10-CM

## 2022-01-23 ENCOUNTER — Other Ambulatory Visit: Payer: BC Managed Care – PPO

## 2022-01-23 DIAGNOSIS — E291 Testicular hypofunction: Secondary | ICD-10-CM

## 2022-01-25 LAB — COMPREHENSIVE METABOLIC PANEL
ALT: 30 IU/L (ref 0–44)
AST: 23 IU/L (ref 0–40)
Albumin/Globulin Ratio: 2.4 — ABNORMAL HIGH (ref 1.2–2.2)
Albumin: 4.3 g/dL (ref 3.8–4.9)
Alkaline Phosphatase: 92 IU/L (ref 44–121)
BUN/Creatinine Ratio: 11 (ref 9–20)
BUN: 11 mg/dL (ref 6–24)
Bilirubin Total: 0.3 mg/dL (ref 0.0–1.2)
CO2: 21 mmol/L (ref 20–29)
Calcium: 9 mg/dL (ref 8.7–10.2)
Chloride: 102 mmol/L (ref 96–106)
Creatinine, Ser: 1.04 mg/dL (ref 0.76–1.27)
Globulin, Total: 1.8 g/dL (ref 1.5–4.5)
Glucose: 186 mg/dL — ABNORMAL HIGH (ref 70–99)
Potassium: 4.4 mmol/L (ref 3.5–5.2)
Sodium: 140 mmol/L (ref 134–144)
Total Protein: 6.1 g/dL (ref 6.0–8.5)
eGFR: 83 mL/min/{1.73_m2} (ref >59)

## 2022-01-25 LAB — CBC WITH DIFFERENTIAL
Basophils Absolute: 0 10*3/uL (ref 0.0–0.2)
Basos: 0 %
EOS (ABSOLUTE): 0.1 10*3/uL (ref 0.0–0.4)
Eos: 2 %
Hematocrit: 51.2 % — ABNORMAL HIGH (ref 37.5–51.0)
Hemoglobin: 18.5 g/dL — ABNORMAL HIGH (ref 13.0–17.7)
Immature Grans (Abs): 0 10*3/uL (ref 0.0–0.1)
Immature Granulocytes: 0 %
Lymphocytes Absolute: 1.9 10*3/uL (ref 0.7–3.1)
Lymphs: 33 %
MCH: 32.6 pg (ref 26.6–33.0)
MCHC: 36.1 g/dL — ABNORMAL HIGH (ref 31.5–35.7)
MCV: 90 fL (ref 79–97)
Monocytes Absolute: 0.4 10*3/uL (ref 0.1–0.9)
Monocytes: 7 %
Neutrophils Absolute: 3.3 10*3/uL (ref 1.4–7.0)
Neutrophils: 58 %
RBC: 5.68 x10E6/uL (ref 4.14–5.80)
RDW: 13 % (ref 11.6–15.4)
WBC: 5.8 10*3/uL (ref 3.4–10.8)

## 2022-01-25 LAB — TESTOSTERONE,FREE AND TOTAL
Testosterone, Free: 14.1 pg/mL (ref 7.2–24.0)
Testosterone: 670 ng/dL (ref 264–916)

## 2022-01-26 ENCOUNTER — Telehealth: Payer: Self-pay

## 2022-01-29 NOTE — Telephone Encounter (Signed)
[place] with pharmacy called to request increase on syringes for testosterone rx.  Verbal given ok to increase to 12 syringes for 90 day supply.

## 2022-01-30 ENCOUNTER — Ambulatory Visit (INDEPENDENT_AMBULATORY_CARE_PROVIDER_SITE_OTHER): Payer: BC Managed Care – PPO | Admitting: Urology

## 2022-01-30 VITALS — BP 129/89 | HR 108 | Ht 75.0 in | Wt 229.0 lb

## 2022-01-30 DIAGNOSIS — E291 Testicular hypofunction: Secondary | ICD-10-CM | POA: Diagnosis not present

## 2022-01-30 DIAGNOSIS — R350 Frequency of micturition: Secondary | ICD-10-CM | POA: Diagnosis not present

## 2022-01-30 DIAGNOSIS — N5201 Erectile dysfunction due to arterial insufficiency: Secondary | ICD-10-CM

## 2022-01-30 DIAGNOSIS — N401 Enlarged prostate with lower urinary tract symptoms: Secondary | ICD-10-CM

## 2022-01-30 LAB — URINALYSIS, ROUTINE W REFLEX MICROSCOPIC
Bilirubin, UA: NEGATIVE
Leukocytes,UA: NEGATIVE
Nitrite, UA: NEGATIVE
RBC, UA: NEGATIVE
Specific Gravity, UA: >1.03 — ABNORMAL HIGH (ref 1.005–1.030)
Urobilinogen, Ur: 1 mg/dL (ref 0.2–1.0)
pH, UA: 5.5 (ref 5.0–7.5)

## 2022-01-30 LAB — MICROSCOPIC EXAMINATION
Bacteria, UA: NONE SEEN
Epithelial Cells (non renal): NONE SEEN /hpf (ref 0–10)
RBC, Urine: NONE SEEN /hpf (ref 0–2)
Renal Epithel, UA: NONE SEEN /hpf
WBC, UA: NONE SEEN /hpf (ref 0–5)

## 2022-01-30 MED ORDER — TADALAFIL 20 MG PO TABS: 20.0000 mg | ORAL_TABLET | Freq: Every day | ORAL | 6 refills | Status: AC | PRN

## 2022-01-30 MED ORDER — TESTOSTERONE CYPIONATE 200 MG/ML IM SOLN: 120.0000 mg | INTRAMUSCULAR | 3 refills | Status: DC

## 2022-01-30 NOTE — Progress Notes (Unsigned)
01/30/2022 9:28 AM   [name] [birth year] [id number]  Referring provider: Dion Body, MD [place] [facility] [place],  [place] [address]  No chief complaint on file.   HPI:  Testosterone 670, CMP normal, HGB 18.5. He is having phlebotomy today.  IPSS 3 QOl 1 on gemtesa. He is off his alpha blockers. Urine stream strong. He takes tadalafil '20mg'$  prn with good results  PMH: Past Medical History:  Diagnosis Date   BPH (benign prostatic hyperplasia)    Diabetes mellitus without complication ([place])    Fatty liver    GERD (gastroesophageal reflux disease)    no meds   History of hiatal hernia    Hyperlipidemia    Hypertension    Low testosterone    Medical history non-contributory    Polycythemia    Sleep apnea    uses a cpap nightly    Surgical History: Past Surgical History:  Procedure Laterality Date   COLONOSCOPY     I & D EXTREMITY Left 06/20/2013   Procedure: MINOR IRRIGATION AND DEBRIDEMENT LEFT INDEX FINGER;  Surgeon: Tennis Must, MD;  Location: [facility];  Service: Orthopedics;  Laterality: Left;   NASAL SINUS SURGERY     UPPER GASTROINTESTINAL ENDOSCOPY     WISDOM TOOTH EXTRACTION      Home Medications:  Allergies as of 01/30/2022       Reactions   Ciprofloxacin Itching   Contrast Media [iodinated Contrast Media] Itching   Gets hot   Erythromycin    Stomach pain   Ioxaglate Itching   Metrizamide Itching   Codeine Itching   Hydrocodone-acetaminophen Itching   Metformin And Related Diarrhea   The plain metformin caused severe diarrhea but the ER version doesnt        Medication List        Accurate as of January 30, 2022  9:28 AM. If you have any questions, ask your nurse or doctor.          acetaminophen 500 MG tablet Commonly known as: TYLENOL Take 1,000 mg by mouth every 8 (eight) hours as needed for moderate pain or headache.   albuterol 108 (90 Base) MCG/ACT inhaler Commonly known as:  VENTOLIN HFA Inhale 2 puffs into the lungs every 4 (four) hours as needed for wheezing or shortness of breath.   aspirin EC 81 MG tablet Take 162 mg by mouth daily.   azelastine 0.1 % nasal spray Commonly known as: ASTELIN Place 1 spray into both nostrils 2 (two) times daily as needed for allergies.   Calcium 250 MG Caps Take 250 mg by mouth daily.   cetirizine 10 MG tablet Commonly known as: ZYRTEC Take 10 mg by mouth daily as needed for allergies.   CoQ10 200 MG Caps Take 200 mg by mouth daily.   cyclobenzaprine 10 MG tablet Commonly known as: FLEXERIL Take 1 tablet by mouth every 8 (eight) hours as needed.   Dialyvite Vitamin D 5000 125 MCG (5000 UT) capsule Generic drug: Cholecalciferol Take 5,000 Units by mouth daily.   diphenhydrAMINE 25 MG tablet Commonly known as: Benadryl Allergy Take 1 tablet (25 mg total) by mouth once for 1 dose. Benadryl 1 hour prior to scan   fluticasone 50 MCG/ACT nasal spray Commonly known as: FLONASE Place 1 spray into the nose daily as needed for allergies.   Gemtesa 75 MG Tabs Generic drug: Vibegron Take 1 capsule by mouth daily.   guaiFENesin 600 MG 12 hr tablet  Commonly known as: MUCINEX Take 600 mg by mouth every 12 (twelve) hours as needed (congestion).   ibuprofen 200 MG tablet Commonly known as: ADVIL Take 400 mg by mouth every 6 (six) hours as needed for headache or moderate pain.   lisinopril 20 MG tablet Commonly known as: ZESTRIL Take 20 mg by mouth daily.   lovastatin 20 MG tablet Commonly known as: MEVACOR Take 20 mg by mouth at bedtime.   magnesium oxide 400 MG tablet Commonly known as: MAG-OX Take 400 mg by mouth every Monday, Wednesday, and Friday.   metFORMIN 500 MG 24 hr tablet Commonly known as: GLUCOPHAGE-XR Take 500 mg by mouth 2 (two) times daily.   montelukast 10 MG tablet Commonly known as: SINGULAIR Take 10 mg by mouth at bedtime.   multivitamin tablet Take 1 tablet by mouth daily.    Potassium 99 MG Tabs Take 99 mg by mouth daily.   PROBIOTIC ADVANCED PO Take 1 tablet by mouth daily.   pseudoephedrine 30 MG tablet Commonly known as: SUDAFED Take 30 mg by mouth every 4 (four) hours as needed for congestion.   silodosin 8 MG Caps capsule Commonly known as: RAPAFLO Take 1 capsule (8 mg total) by mouth at bedtime.   tadalafil 20 MG tablet Commonly known as: CIALIS Take 1 tablet (20 mg total) by mouth daily as needed for erectile dysfunction.   tamsulosin 0.4 MG Caps capsule Commonly known as: FLOMAX TAKE 1 CAPSULE BY MOUTH AT BEDTIME   testosterone cypionate 200 MG/ML injection Commonly known as: DEPOTESTOSTERONE CYPIONATE INJECT 0.6 MLS (120 MG TOTAL) INTO THE MUSCLE EVERY 7 (SEVEN) DAYS.   vitamin B-12 1000 MCG tablet Commonly known as: CYANOCOBALAMIN Take 1,000 mcg by mouth daily.        Allergies:  Allergies  Allergen Reactions   Ciprofloxacin Itching   Contrast Media [Iodinated Contrast Media] Itching    Gets hot   Erythromycin     Stomach pain   Ioxaglate Itching   Metrizamide Itching   Codeine Itching   Hydrocodone-Acetaminophen Itching   Metformin And Related Diarrhea    The plain metformin caused severe diarrhea but the ER version doesnt    Family History: Family History  Problem Relation Age of Onset   Colon cancer Father     Social History:  reports that he has never smoked. He has never used smokeless tobacco. He reports current alcohol use. He reports that he does not use drugs.  ROS: All other review of systems were reviewed and are negative except what is noted above in HPI  Physical Exam: BP 129/89   Pulse (!) 108   Ht '6\' 3"'$  (1.905 m)   Wt 229 lb (103.9 kg)   BMI 28.62 kg/m   Constitutional:  Alert and oriented, No acute distress. HEENT: [place] AT, moist mucus membranes.  Trachea midline, no masses. Cardiovascular: No clubbing, cyanosis, or edema. Respiratory: Normal respiratory effort, no increased work of  breathing. GI: Abdomen is soft, nontender, nondistended, no abdominal masses GU: No CVA tenderness.  Lymph: No cervical or inguinal lymphadenopathy. Skin: No rashes, bruises or suspicious lesions. Neurologic: Grossly intact, no focal deficits, moving all 4 extremities. Psychiatric: Normal mood and affect.  Laboratory Data: Lab Results  Component Value Date   WBC 5.8 01/23/2022   HGB 18.5 (H) 01/23/2022   HCT 51.2 (H) 01/23/2022   MCV 90 01/23/2022   PLT 169 05/27/2021    Lab Results  Component Value Date   CREATININE 1.04 01/23/2022  No results found for: "PSA"  Lab Results  Component Value Date   TESTOSTERONE 670 01/23/2022    Lab Results  Component Value Date   HGBA1C 6.4 (H) 05/22/2021    Urinalysis    Component Value Date/Time   COLORURINE AMBER (A) 05/22/2021 0833   APPEARANCEUR [place] (A) 11/21/2021 1042   LABSPEC 1.032 (H) 05/22/2021 0833   LABSPEC 1.015 11/03/2013 1218   PHURINE 5.0 05/22/2021 0833   GLUCOSEU Trace (A) 11/21/2021 1042   GLUCOSEU NEGATIVE 11/03/2013 1218   HGBUR MODERATE (A) 05/22/2021 0833   BILIRUBINUR Negative 11/21/2021 1042   BILIRUBINUR Negative 11/13/2011 1910   KETONESUR 5 (A) 05/22/2021 0833   PROTEINUR Trace (A) 11/21/2021 1042   PROTEINUR >=300 (A) 05/22/2021 0833   UROBILINOGEN 2.0 (H) 08/23/2014 2100   NITRITE Negative 11/21/2021 1042   NITRITE NEGATIVE 05/22/2021 0833   LEUKOCYTESUR Negative 11/21/2021 1042   LEUKOCYTESUR MODERATE (A) 05/22/2021 0833   LEUKOCYTESUR Negative 11/13/2011 1910    Lab Results  Component Value Date   LABMICR Comment 11/21/2021   WBCUA None seen 10/15/2021   LABEPIT 0-10 10/15/2021   MUCUS Present 10/15/2021   BACTERIA None seen 10/15/2021    Pertinent Imaging:  No results found for this or any previous visit.  No results found for this or any previous visit.  No results found for this or any previous visit.  No results found for this or any previous visit.  No results found  for this or any previous visit.  No results found for this or any previous visit.  No results found for this or any previous visit.  Results for orders placed during the hospital encounter of 05/22/21  CT Renal Stone Study  Narrative CLINICAL DATA:  Bilateral flank pain.  Dysuria.  EXAM: CT ABDOMEN AND PELVIS WITHOUT CONTRAST  TECHNIQUE: Multidetector CT imaging of the abdomen and pelvis was performed following the standard protocol without IV contrast.  COMPARISON:  01/13/2021  FINDINGS: Lower chest: Clear lung bases. Normal heart size without pericardial or pleural effusion.  Hepatobiliary: Moderate hepatic steatosis. Normal gallbladder, without biliary ductal dilatation.  Pancreas: Normal, without mass or ductal dilatation.  Spleen: Normal in size, without focal abnormality.  Adrenals/Urinary Tract: Normal adrenal glands. Punctate interpolar left renal collecting system calculus. No right renal calculi. No hydronephrosis. No hydroureter or ureteric calculi. No bladder calculi.  Stomach/Bowel: Small bowel malrotation again identified. Normal position of the ileocecal junction. Appendix is not visualized but there is no evidence of right lower quadrant inflammation. Otherwise normal small bowel.  Vascular/Lymphatic: Aortic atherosclerosis. No abdominopelvic adenopathy.  Reproductive: Mild prostatomegaly.  Other: No significant free fluid.  No free intraperitoneal air.  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 Lower thoracic and less so upper lumbar spondylosis.  IMPRESSION: 1. Left nephrolithiasis. No obstructive uropathy. 2. Hepatic steatosis. 3. Small bowel malrotation, as before. 4. Prostatomegaly. 5. Aortic Atherosclerosis (ICD10-I70.0).   Electronically Signed By: Abigail Miyamoto M.D. On: 05/22/2021 10:35   Assessment & Plan:    1. Benign localized prostatic hyperplasia with lower urinary tract symptoms  (LUTS) -Continue gemtesa - Urinalysis, Routine w reflex microscopic  2. Urinary frequency -Continue gemtesa  3. Male hypogonadism -continue testosterone '120mg'$  every 7 days -RTC 6 months with testosterone labs  4. Erectile dysfunction due to arterial insufficiency -continue tadalafil '20mg'$  prn   No follow-ups on file.  Nicolette Bang, MD  [facility] [place]

## 2022-02-03 ENCOUNTER — Encounter: Payer: Self-pay | Admitting: Urology

## 2022-02-03 NOTE — Patient Instructions (Signed)

## 2022-03-06 ENCOUNTER — Ambulatory Visit (HOSPITAL_COMMUNITY)
Admission: EM | Admit: 2022-03-06 | Discharge: 2022-03-06 | Disposition: A | Discharge status: Home / Self Care | Payer: BC Managed Care – PPO | Attending: Emergency Medicine | Admitting: Emergency Medicine

## 2022-03-06 ENCOUNTER — Encounter (HOSPITAL_COMMUNITY): Payer: Self-pay | Admitting: Emergency Medicine

## 2022-03-06 DIAGNOSIS — W5501XA Bitten by cat, initial encounter: Secondary | ICD-10-CM | POA: Diagnosis not present

## 2022-03-06 DIAGNOSIS — S61432A Puncture wound without foreign body of left hand, initial encounter: Secondary | ICD-10-CM | POA: Diagnosis not present

## 2022-03-06 DIAGNOSIS — W5503XA Scratched by cat, initial encounter: Secondary | ICD-10-CM

## 2022-03-06 MED ORDER — AMOXICILLIN-POT CLAVULANATE 875-125 MG PO TABS: 1.0000 | ORAL_TABLET | Freq: Two times a day (BID) | ORAL | 0 refills | Status: AC

## 2022-03-06 NOTE — Discharge Instructions (Addendum)
Please take medication as prescribed. I recommend taking with food to help with upset stomach. Drink lots of water too!  Please watch for any signs of infection. You can return with any concerns.

## 2022-03-06 NOTE — ED Provider Notes (Signed)
[place]    CSN: [id number] Arrival date & time: 03/06/22  1331     History   Chief Complaint Chief Complaint  Patient presents with   Animal Bite    HPI [name] is a 61 y.o. male.  Presents with cat bite to the left hand. Occurred today when getting one of his 10 cats out of their carrier.  Cat got him in 3 different areas on the left hand.  Bleeding controlled.  He thoroughly irrigated with water and cleaned with hydrogen peroxide.  Reports cat is fully up-to-date on all vaccinations, just had a vet visit with a teeth cleaning today.  Patient reports his last tetanus was 2014, declines update today.  Mild pain with the bites. He called his hand doctor who recommended he be seen in urgent care.  Past Medical History:  Diagnosis Date   BPH (benign prostatic hyperplasia)    Diabetes mellitus without complication ([place])    Fatty liver    GERD (gastroesophageal reflux disease)    no meds   History of hiatal hernia    Hyperlipidemia    Hypertension    Low testosterone    Medical history non-contributory    Polycythemia    Sleep apnea    uses a cpap nightly    Patient Active Problem List   Diagnosis Date Noted   Urinary frequency 09/29/2021   Sinus tachycardia 05/24/2021   Allergic rhinitis 05/23/2021   Sepsis ([place]) 05/22/2021   Benign localized prostatic hyperplasia with lower urinary tract symptoms (LUTS)    Diabetes mellitus without complication ([place])    AKI (acute kidney injury) ([place])    Sleep apnea    Partial small bowel obstruction ([place]) 12/18/2017   Essential hypertension 12/18/2017   Low testosterone in male 12/18/2017    Past Surgical History:  Procedure Laterality Date   COLONOSCOPY     I & D EXTREMITY Left 06/20/2013   Procedure: MINOR IRRIGATION AND DEBRIDEMENT LEFT INDEX FINGER;  Surgeon: Tennis Must, MD;  Location: [facility];  Service: Orthopedics;  Laterality: Left;   NASAL SINUS SURGERY     UPPER  GASTROINTESTINAL ENDOSCOPY     WISDOM TOOTH EXTRACTION         Home Medications    Prior to Admission medications   Medication Sig Start Date End Date Taking? Authorizing Provider  amoxicillin-clavulanate (AUGMENTIN) 875-125 MG tablet Take 1 tablet by mouth 2 (two) times daily for 5 days. 03/06/22 03/11/22 Yes Matheus Spiker, Wells Guiles, PA-C  aspirin 81 MG EC tablet Take 162 mg by mouth daily.    Yes [provider]  Cholecalciferol (DIALYVITE VITAMIN D 5000) 125 MCG (5000 UT) capsule Take 5,000 Units by mouth daily.   Yes [provider]  Coenzyme Q10 (COQ10) 200 MG CAPS Take 200 mg by mouth daily.   Yes [provider]  lisinopril (ZESTRIL) 20 MG tablet Take 20 mg by mouth daily.  06/26/19  Yes [provider]  lovastatin (MEVACOR) 20 MG tablet Take 20 mg by mouth at bedtime.   Yes [provider]  magnesium oxide (MAG-OX) 400 MG tablet Take 400 mg by mouth every Monday, Wednesday, and Friday.   Yes [provider]  metFORMIN (GLUCOPHAGE-XR) 500 MG 24 hr tablet Take 500 mg by mouth 2 (two) times daily.   Yes [provider]  Potassium 99 MG TABS Take 99 mg by mouth daily.   Yes [provider]  Probiotic Product (PROBIOTIC ADVANCED PO) Take  1 tablet by mouth daily.   Yes [provider]  Vibegron (GEMTESA) 75 MG TABS Take 1 capsule by mouth daily. 11/21/21  Yes McKenzie, Candee Furbish, MD  vitamin B-12 (CYANOCOBALAMIN) 1000 MCG tablet Take 1,000 mcg by mouth daily.   Yes [provider]  acetaminophen (TYLENOL) 500 MG tablet Take 1,000 mg by mouth every 8 (eight) hours as needed for moderate pain or headache.    [provider]  albuterol (VENTOLIN HFA) 108 (90 Base) MCG/ACT inhaler Inhale 2 puffs into the lungs every 4 (four) hours as needed for wheezing or shortness of breath.  12/22/19 12/21/20  [provider]  azelastine (ASTELIN) 0.1 % nasal spray Place 1 spray into both nostrils 2 (two) times  daily as needed for allergies.  12/25/19 12/24/20  [provider]  Calcium 250 MG CAPS Take 250 mg by mouth daily.    [provider]  cetirizine (ZYRTEC) 10 MG tablet Take 10 mg by mouth daily as needed for allergies.     [provider]  cyclobenzaprine (FLEXERIL) 10 MG tablet Take 1 tablet by mouth every 8 (eight) hours as needed. 09/19/20   [provider]  diphenhydrAMINE (BENADRYL ALLERGY) 25 MG tablet Take 1 tablet (25 mg total) by mouth once for 1 dose. Benadryl 1 hour prior to scan 07/24/21 07/24/21  Cleon Gustin, MD  fluticasone [facility]) 50 MCG/ACT nasal spray Place 1 spray into the nose daily as needed for allergies.     [provider]  guaiFENesin (MUCINEX) 600 MG 12 hr tablet Take 600 mg by mouth every 12 (twelve) hours as needed (congestion).     [provider]  ibuprofen (ADVIL) 200 MG tablet Take 400 mg by mouth every 6 (six) hours as needed for headache or moderate pain.    [provider]  montelukast (SINGULAIR) 10 MG tablet Take 10 mg by mouth at bedtime.  04/07/20   [provider]  Multiple Vitamin (MULTIVITAMIN) tablet Take 1 tablet by mouth daily.    [provider]  pseudoephedrine (SUDAFED) 30 MG tablet Take 30 mg by mouth every 4 (four) hours as needed for congestion.     [provider]  silodosin (RAPAFLO) 8 MG CAPS capsule Take 1 capsule (8 mg total) by mouth at bedtime. 11/21/21   McKenzie, Candee Furbish, MD  tadalafil (CIALIS) 20 MG tablet Take 1 tablet (20 mg total) by mouth daily as needed for erectile dysfunction. 01/30/22   McKenzie, Candee Furbish, MD  tamsulosin (FLOMAX) 0.4 MG CAPS capsule TAKE 1 CAPSULE BY MOUTH AT BEDTIME 02/10/21   McKenzie, Candee Furbish, MD  testosterone cypionate (DEPOTESTOSTERONE CYPIONATE) 200 MG/ML injection Inject 0.6 mLs (120 mg total) into the muscle every 7 (seven) days. 01/30/22   McKenzie, Candee Furbish, MD    Family History Family History  Problem Relation  Age of Onset   Colon cancer Father     Social History Social History   Tobacco Use   Smoking status: Never   Smokeless tobacco: Never  Vaping Use   Vaping Use: Never used  Substance Use Topics   Alcohol use: Yes    Comment: occ   Drug use: No     Allergies   Ciprofloxacin, Contrast media [iodinated contrast media], Erythromycin, Ioxaglate, Metrizamide, Codeine, Hydrocodone-acetaminophen, and Metformin and related   Review of Systems Review of Systems Per HPI  Physical Exam Triage Vital Signs ED Triage Vitals  Enc Vitals Group     BP 03/06/22 1420 131/88  Pulse Rate 03/06/22 1418 97     Resp --      Temp 03/06/22 1418 98 F (36.7 C)     Temp Source 03/06/22 1418 Oral     SpO2 03/06/22 1418 97 %     Weight --      Height --      Head Circumference --      Peak Flow --      Pain Score 03/06/22 1422 5     Pain Loc --      Pain Edu? --      Excl. in [place]? --    No data found.  Updated Vital Signs BP 131/88 (BP Location: Left Arm)   Pulse 97   Temp 98 F (36.7 C) (Oral)   SpO2 97%    Physical Exam Vitals and nursing note reviewed.  Constitutional:      Appearance: Normal appearance.  HENT:     Mouth/Throat:     Pharynx: Oropharynx is clear.  Eyes:     Conjunctiva/sclera: Conjunctivae normal.  Cardiovascular:     Rate and Rhythm: Normal rate and regular rhythm.     Pulses: Normal pulses.     Heart sounds: Normal heart sounds.  Pulmonary:     Effort: Pulmonary effort is normal.     Breath sounds: Normal breath sounds.  Musculoskeletal:        General: Normal range of motion.     Cervical back: Normal range of motion.  Skin:    General: Skin is warm and dry.     Capillary Refill: Capillary refill takes less than 2 seconds.     Findings: Wound present.     Comments: 3 areas of shallow scratches/puncture bites to left hand.  No injuries over joints.  Nothing deep, already appear to be healing over. Not currently bleeding.  Full ROM of the hand.   Sensation intact distally  Neurological:     Mental Status: He is alert and oriented to person, place, and time.      UC Treatments / Results  Labs (all labs ordered are listed, but only abnormal results are displayed) Labs Reviewed - No data to display  EKG   Radiology No results found.  Procedures Procedures (including critical care time)  Medications Ordered in UC Medications - No data to display  Initial Impression / Assessment and Plan / UC Course  I have reviewed the triage vital signs and the nursing notes.  Pertinent labs & imaging results that were available during my care of the patient were reviewed by me and considered in my medical decision making (see chart for details).  Augmentin twice daily for 5 days.  Watch for signs of infection.  Keep clean with warm water and soap.  Strict return precautions.  Declined tetanus update, he is right on the 10-year range for needing updated shot.  Discussed signs and symptoms of tetanus to look for.  Patient agrees with plan and he is discharged in stable condition.  Final Clinical Impressions(s) / UC Diagnoses   Final diagnoses:  Cat bite, initial encounter     Discharge Instructions      Please take medication as prescribed. I recommend taking with food to help with upset stomach. Drink lots of water too!  Please watch for any signs of infection. You can return with any concerns.    ED Prescriptions     Medication Sig Dispense Auth. Provider   amoxicillin-clavulanate (AUGMENTIN) 875-125 MG tablet Take 1 tablet by  mouth 2 (two) times daily for 5 days. 10 tablet Emy Angevine, Wells Guiles, PA-C      PDMP not reviewed this encounter.   Pal Shell, Wells Guiles, [place] 03/06/22 1503

## 2022-03-06 NOTE — ED Triage Notes (Addendum)
Patient c/o animal bite that happened today.   Patient has bite present to LFT hand.   Patient endorses his cat bit him.   Patients cat is up to date with immunizations.   Bleeding is currently controlled.   Patient cleaned area with hydrogen peroxide and water.   History of DM.

## 2022-03-10 ENCOUNTER — Other Ambulatory Visit: Payer: Self-pay

## 2022-03-10 DIAGNOSIS — R350 Frequency of micturition: Secondary | ICD-10-CM

## 2022-03-10 MED ORDER — GEMTESA 75 MG PO TABS: 1.0000 | ORAL_TABLET | Freq: Every day | ORAL | 11 refills | Status: DC

## 2022-03-10 NOTE — Progress Notes (Signed)
Patient called for [name] to be sent to pharmacy for rx. Patient has been getting samples but will need prescription sent in order to start PA if needed. Sent to mail order pharmacy per patient.    Samples left at desk for patient until prescription is filled.

## 2022-03-15 ENCOUNTER — Ambulatory Visit (HOSPITAL_COMMUNITY)
Admission: EM | Admit: 2022-03-15 | Discharge: 2022-03-15 | Disposition: A | Discharge status: Home / Self Care | Payer: Worker's Compensation | Attending: Physician Assistant | Admitting: Physician Assistant

## 2022-03-15 DIAGNOSIS — M7711 Lateral epicondylitis, right elbow: Secondary | ICD-10-CM | POA: Diagnosis not present

## 2022-03-15 DIAGNOSIS — M25521 Pain in right elbow: Secondary | ICD-10-CM

## 2022-03-15 DIAGNOSIS — M7021 Olecranon bursitis, right elbow: Secondary | ICD-10-CM

## 2022-03-15 MED ORDER — PREDNISONE 10 MG PO TABS: 10.0000 mg | ORAL_TABLET | Freq: Three times a day (TID) | ORAL | 0 refills | Status: DC

## 2022-03-15 MED ORDER — IBUPROFEN 800 MG PO TABS: 800.0000 mg | ORAL_TABLET | Freq: Three times a day (TID) | ORAL | 0 refills | Status: DC

## 2022-03-15 MED ORDER — IBUPROFEN 800 MG PO TABS: 800.0000 mg | ORAL_TABLET | Freq: Three times a day (TID) | ORAL | 0 refills | Status: AC

## 2022-03-15 NOTE — ED Provider Notes (Signed)
[place]    CSN: [id number] Arrival date & time: 03/15/22  1423      History   Chief Complaint Chief Complaint  Patient presents with   Elbow Pain    HPI [name] is a 61 y.o. male.   61 year old male presents with right elbow pain.  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  Patient indicates since then [name] has had pain, tenderness, and limited motion of the right elbow.  Patient indicates the pain is in the posterior aspect of the elbow and the lateral side.  Patient indicates that it hurts when [name] bends the elbow or tries to stretch it out straight.  Patient indicates [name] does have swelling of the posterior elbow but has been using some ice and this is helped reduce it somewhat.  Patient indicates [name] is not had any trauma to the right elbow.  Patient relates [name] has been using some Advil and this is helping a little bit.  Patient decays [name] is not having any weakness, numbness, or tingling.     Past Medical History:  Diagnosis Date   BPH (benign prostatic hyperplasia)    Diabetes mellitus without complication ([place])    Fatty liver    GERD (gastroesophageal reflux disease)    no meds   History of hiatal hernia    Hyperlipidemia    Hypertension    Low testosterone    Medical history non-contributory    Polycythemia    Sleep apnea    uses a cpap nightly    Patient Active Problem List   Diagnosis Date Noted   Urinary frequency 09/29/2021   Sinus tachycardia 05/24/2021   Allergic rhinitis 05/23/2021   Sepsis ([place]) 05/22/2021   Benign localized prostatic hyperplasia with lower urinary tract symptoms (LUTS)    Diabetes mellitus without complication ([place])    AKI (acute kidney injury) ([place])    Sleep apnea    Partial small bowel obstruction ([place]) 12/18/2017   Essential hypertension 12/18/2017   Low testosterone in male 12/18/2017    Past Surgical History:  Procedure Laterality Date   COLONOSCOPY     I &  D EXTREMITY Left 06/20/2013   Procedure: MINOR IRRIGATION AND DEBRIDEMENT LEFT INDEX FINGER;  Surgeon: Tennis Must, MD;  Location: [facility];  Service: Orthopedics;  Laterality: Left;   NASAL SINUS SURGERY     UPPER GASTROINTESTINAL ENDOSCOPY     WISDOM TOOTH EXTRACTION         Home Medications    Prior to Admission medications   Medication Sig Start Date End Date Taking? Authorizing Provider  ibuprofen (ADVIL) 800 MG tablet Take 1 tablet (800 mg total) by mouth 3 (three) times daily. 03/15/22  Yes Nyoka Lint, PA-C  predniSONE (DELTASONE) 10 MG tablet Take 1 tablet (10 mg total) by mouth 3 (three) times daily. 03/15/22  Yes Nyoka Lint, PA-C  acetaminophen (TYLENOL) 500 MG tablet Take 1,000 mg by mouth every 8 (eight) hours as needed for moderate pain or headache.    [provider]  albuterol (VENTOLIN HFA) 108 (90 Base) MCG/ACT inhaler Inhale 2 puffs into the lungs every 4 (four) hours as needed for wheezing or shortness of breath.  12/22/19 12/21/20  [provider]  aspirin 81 MG EC tablet Take 162 mg by mouth daily.     [provider]  azelastine (ASTELIN) 0.1 % nasal spray Place 1 spray into both nostrils 2 (  two) times daily as needed for allergies.  12/25/19 12/24/20  [provider]  Calcium 250 MG CAPS Take 250 mg by mouth daily.    [provider]  cetirizine (ZYRTEC) 10 MG tablet Take 10 mg by mouth daily as needed for allergies.     [provider]  Cholecalciferol (DIALYVITE VITAMIN D 5000) 125 MCG (5000 UT) capsule Take 5,000 Units by mouth daily.    [provider]  Coenzyme Q10 (COQ10) 200 MG CAPS Take 200 mg by mouth daily.    [provider]  cyclobenzaprine (FLEXERIL) 10 MG tablet Take 1 tablet by mouth every 8 (eight) hours as needed. 09/19/20   [provider]  diphenhydrAMINE (BENADRYL ALLERGY) 25 MG tablet Take 1 tablet (25 mg total) by mouth once for 1 dose. Benadryl 1 hour  prior to scan 07/24/21 07/24/21  Cleon Gustin, MD  fluticasone [facility]) 50 MCG/ACT nasal spray Place 1 spray into the nose daily as needed for allergies.     [provider]  guaiFENesin (MUCINEX) 600 MG 12 hr tablet Take 600 mg by mouth every 12 (twelve) hours as needed (congestion).     [provider]  lisinopril (ZESTRIL) 20 MG tablet Take 20 mg by mouth daily.  06/26/19   [provider]  lovastatin (MEVACOR) 20 MG tablet Take 20 mg by mouth at bedtime.    [provider]  magnesium oxide (MAG-OX) 400 MG tablet Take 400 mg by mouth every Monday, Wednesday, and Friday.    [provider]  metFORMIN (GLUCOPHAGE-XR) 500 MG 24 hr tablet Take 500 mg by mouth 2 (two) times daily.    [provider]  montelukast (SINGULAIR) 10 MG tablet Take 10 mg by mouth at bedtime.  04/07/20   [provider]  Multiple Vitamin (MULTIVITAMIN) tablet Take 1 tablet by mouth daily.    [provider]  Potassium 99 MG TABS Take 99 mg by mouth daily.    [provider]  Probiotic Product (PROBIOTIC ADVANCED PO) Take 1 tablet by mouth daily.    [provider]  pseudoephedrine (SUDAFED) 30 MG tablet Take 30 mg by mouth every 4 (four) hours as needed for congestion.     [provider]  silodosin (RAPAFLO) 8 MG CAPS capsule Take 1 capsule (8 mg total) by mouth at bedtime. 11/21/21   McKenzie, Candee Furbish, MD  tadalafil (CIALIS) 20 MG tablet Take 1 tablet (20 mg total) by mouth daily as needed for erectile dysfunction. 01/30/22   McKenzie, Candee Furbish, MD  tamsulosin (FLOMAX) 0.4 MG CAPS capsule TAKE 1 CAPSULE BY MOUTH AT BEDTIME 02/10/21   McKenzie, Candee Furbish, MD  testosterone cypionate (DEPOTESTOSTERONE CYPIONATE) 200 MG/ML injection Inject 0.6 mLs (120 mg total) into the muscle every 7 (seven) days. 01/30/22   McKenzie, Candee Furbish, MD  Vibegron (GEMTESA) 75 MG TABS Take 1 capsule by mouth daily. 03/10/22   McKenzie, Candee Furbish, MD   vitamin B-12 (CYANOCOBALAMIN) 1000 MCG tablet Take 1,000 mcg by mouth daily.    [provider]    Family History Family History  Problem Relation Age of Onset   Colon cancer Father     Social History Social History   Tobacco Use   Smoking status: Never   Smokeless tobacco: Never  Vaping Use   Vaping Use: Never used  Substance Use Topics   Alcohol use: Yes    Comment: occ   Drug use: No     Allergies  Ciprofloxacin, Contrast media [iodinated contrast media], Erythromycin, Ioxaglate, Metrizamide, Codeine, Hydrocodone-acetaminophen, and Metformin and related   Review of Systems Review of Systems  Musculoskeletal:  Positive for joint swelling (right elbow).     Physical Exam Triage Vital Signs ED Triage Vitals  Enc Vitals Group     BP 03/15/22 1510 (!) 167/96     Pulse Rate 03/15/22 1510 96     Resp 03/15/22 1510 18     Temp 03/15/22 1510 (!) 97.4 F (36.3 C)     Temp src --      SpO2 03/15/22 1510 98 %     Weight --      Height --      Head Circumference --      Peak Flow --      Pain Score 03/15/22 1511 0     Pain Loc --      Pain Edu? --      Excl. in [place]? --    No data found.  Updated Vital Signs BP (!) 174/88   Pulse 74   Temp 98.2 F (36.8 C)   Resp 18   SpO2 98%   Visual Acuity Right Eye Distance:   Left Eye Distance:   Bilateral Distance:    Right Eye Near:   Left Eye Near:    Bilateral Near:     Physical Exam Constitutional:      Appearance: Normal appearance.  Musculoskeletal:     Comments: Right elbow: Swelling is 1+ posterior elbow, no redness, tenderness on palpation.  Range of motion is limited with pain on full flexion and extension.  Supination and pronation are normal with minimal pain, no crepitus with motion.  Tenderness is also palpated along the lateral epicondylar attachment.  Strength is mildly decreased in the right hand.  Neurological:     Mental Status: [name] is alert.      UC Treatments / Results   Labs (all labs ordered are listed, but only abnormal results are displayed) Labs Reviewed - No data to display  EKG   Radiology No results found.  Procedures Procedures (including critical care time)  Medications Ordered in UC Medications - No data to display  Initial Impression / Assessment and Plan / UC Course  I have reviewed the triage vital signs and the nursing notes.  Pertinent labs & imaging results that were available during my care of the patient were reviewed by me and considered in my medical decision making (see chart for details).    Plan: .  Advised take ibuprofen 800 mg 1 every 8 hours with food to help decrease the pain. Advised take prednisone 10 mg 1 3 times a day to help reduce the swelling. 3.  Advised to use ice therapy, 10 minutes on 20 minutes off, 3-4 times a day to help reduce the swelling and pain. 4.  Advised to follow-up with PCP or return to urgent care if symptoms fail to improve. Final Clinical Impressions(s) / UC Diagnoses   Final diagnoses:  Right elbow pain  Olecranon bursitis of right elbow  Right lateral epicondylitis     Discharge Instructions      Advised no heavy lifting greater than 10 pounds for the next 10 days. 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 Advised to ice the area frequently, 10 minutes on 20 minutes off, 3-4 times throughout the day. Advised to take ibuprofen 800 mg 1 every 8  hours with food to help decrease the swelling and the pain. Advised to take prednisone 10 mg 1- 3 times a day to help reduce the swelling and the discomfort. Advised to follow-up PCP or return to urgent care if symptoms fail to improve.    ED Prescriptions     Medication Sig Dispense Auth. Provider   predniSONE (DELTASONE) 10 MG tablet Take 1 tablet (10 mg total) by mouth 3 (three) times daily. 15 tablet Nyoka Lint, PA-C   ibuprofen (ADVIL) 800 MG tablet Take 1 tablet  (800 mg total) by mouth 3 (three) times daily. 30 tablet Nyoka Lint, PA-C      PDMP not reviewed this encounter.   Nyoka Lint, PA-C 03/15/22 1617

## 2022-03-15 NOTE — Discharge Instructions (Signed)
Advised no heavy lifting greater than 10 pounds for the next 10 days. 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 Advised to ice the area frequently, 10 minutes on 20 minutes off, 3-4 times throughout the day. Advised to take ibuprofen 800 mg 1 every 8 hours with food to help decrease the swelling and the pain. Advised to take prednisone 10 mg 1- 3 times a day to help reduce the swelling and the discomfort. Advised to follow-up PCP or return to urgent care if symptoms fail to improve.

## 2022-03-26 ENCOUNTER — Encounter: Payer: Self-pay | Admitting: Urology

## 2022-03-27 NOTE — Telephone Encounter (Signed)
Prior Authorization submitted.

## 2022-03-30 ENCOUNTER — Encounter: Payer: Self-pay | Admitting: Urology

## 2022-03-31 ENCOUNTER — Other Ambulatory Visit: Payer: Self-pay

## 2022-03-31 DIAGNOSIS — R350 Frequency of micturition: Secondary | ICD-10-CM

## 2022-03-31 MED ORDER — GEMTESA 75 MG PO TABS: 1.0000 | ORAL_TABLET | Freq: Every day | ORAL | 3 refills | Status: DC

## 2022-04-01 ENCOUNTER — Telehealth: Payer: Self-pay

## 2022-04-01 NOTE — Telephone Encounter (Signed)
Received PA for Testosterone and current insurance on file do not have prescription coverage. Patient was driving and unable to give prescription coverage and will return call later today with prescription coverage.

## 2022-04-03 ENCOUNTER — Encounter: Payer: Self-pay | Admitting: Urology

## 2022-04-03 NOTE — Telephone Encounter (Signed)
Patient sent in [employer] information for PA.

## 2022-04-03 NOTE — Telephone Encounter (Signed)
Tried calling patient with no answer to follow up on RX insurance coverage for PA. Will try back at a later time.

## 2022-06-08 ENCOUNTER — Emergency Department (HOSPITAL_BASED_OUTPATIENT_CLINIC_OR_DEPARTMENT_OTHER)
Admission: EM | Admit: 2022-06-08 | Discharge: 2022-06-08 | Disposition: A | Discharge status: Home / Self Care | Payer: BC Managed Care – PPO | Attending: Emergency Medicine | Admitting: Emergency Medicine

## 2022-06-08 ENCOUNTER — Encounter (HOSPITAL_BASED_OUTPATIENT_CLINIC_OR_DEPARTMENT_OTHER): Payer: Self-pay

## 2022-06-08 ENCOUNTER — Other Ambulatory Visit: Payer: Self-pay

## 2022-06-08 ENCOUNTER — Emergency Department (HOSPITAL_BASED_OUTPATIENT_CLINIC_OR_DEPARTMENT_OTHER): Payer: BC Managed Care – PPO | Admitting: Radiology

## 2022-06-08 DIAGNOSIS — Z23 Encounter for immunization: Secondary | ICD-10-CM | POA: Insufficient documentation

## 2022-06-08 DIAGNOSIS — Z7982 Long term (current) use of aspirin: Secondary | ICD-10-CM | POA: Insufficient documentation

## 2022-06-08 DIAGNOSIS — S8992XA Unspecified injury of left lower leg, initial encounter: Secondary | ICD-10-CM | POA: Diagnosis present

## 2022-06-08 DIAGNOSIS — W130XXA Fall from, out of or through balcony, initial encounter: Secondary | ICD-10-CM | POA: Diagnosis not present

## 2022-06-08 DIAGNOSIS — S40012A Contusion of left shoulder, initial encounter: Secondary | ICD-10-CM

## 2022-06-08 DIAGNOSIS — S80212A Abrasion, left knee, initial encounter: Secondary | ICD-10-CM

## 2022-06-08 MED ORDER — TETANUS-DIPHTH-ACELL PERTUSSIS 5-2.5-18.5 LF-MCG/0.5 IM SUSY
0.5000 mL | PREFILLED_SYRINGE | Freq: Once | INTRAMUSCULAR | Status: AC
  Administered 2022-06-08: 0.5 mL via INTRAMUSCULAR
  Filled 2022-06-08: qty 0.5

## 2022-06-08 NOTE — ED Triage Notes (Signed)
Patient here POV from Home.  Endorses Falling Off Porch today approximately 6 Feet into [place]. Fell onto Left Side.   No LOC. No Head Injury. No Anticoagulants. Abrasion to Left Knee. Pain to Left Shoulder.   NAD noted during Triage. A&Ox4. GCS 15. Ambulatory.

## 2022-06-11 NOTE — ED Provider Notes (Signed)
[place] [facility]    CSN: [id number] Arrival date & time: 06/08/22  1647      History   Chief Complaint Chief Complaint  Patient presents with   Fall    HPI [name] is a 61 y.o. male.   Pt reports he fell off of his porch.  Pt reports no impact of his head.  No loss of consciousness.  Pt reports he is clumsy.  No impact of head  no loc.  No neck or back pain  Pt complains of an abrasion to his leg and soreness in his shulder  The history is provided by the patient. No language interpreter was used.  Fall This is a new problem. The problem occurs constantly. The problem has not changed since onset.Pertinent negatives include no chest pain.    Past Medical History:  Diagnosis Date   BPH (benign prostatic hyperplasia)    Diabetes mellitus without complication ([place])    Fatty liver    GERD (gastroesophageal reflux disease)    no meds   History of hiatal hernia    Hyperlipidemia    Hypertension    Low testosterone    Medical history non-contributory    Polycythemia    Sleep apnea    uses a cpap nightly    Patient Active Problem List   Diagnosis Date Noted   Urinary frequency 09/29/2021   Sinus tachycardia 05/24/2021   Allergic rhinitis 05/23/2021   Sepsis ([place]) 05/22/2021   Benign localized prostatic hyperplasia with lower urinary tract symptoms (LUTS)    Diabetes mellitus without complication ([place])    AKI (acute kidney injury) ([place])    Sleep apnea    Partial small bowel obstruction ([place]) 12/18/2017   Essential hypertension 12/18/2017   Low testosterone in male 12/18/2017    Past Surgical History:  Procedure Laterality Date   COLONOSCOPY     I & D EXTREMITY Left 06/20/2013   Procedure: MINOR IRRIGATION AND DEBRIDEMENT LEFT INDEX FINGER;  Surgeon: Tennis Must, MD;  Location: [facility];  Service: Orthopedics;  Laterality: Left;   NASAL SINUS SURGERY     UPPER GASTROINTESTINAL ENDOSCOPY     WISDOM TOOTH EXTRACTION          Home Medications    Prior to Admission medications   Medication Sig Start Date End Date Taking? Authorizing Provider  acetaminophen (TYLENOL) 500 MG tablet Take 1,000 mg by mouth every 8 (eight) hours as needed for moderate pain or headache.    [provider]  albuterol (VENTOLIN HFA) 108 (90 Base) MCG/ACT inhaler Inhale 2 puffs into the lungs every 4 (four) hours as needed for wheezing or shortness of breath.  12/22/19 12/21/20  [provider]  aspirin 81 MG EC tablet Take 162 mg by mouth daily.     [provider]  azelastine (ASTELIN) 0.1 % nasal spray Place 1 spray into both nostrils 2 (two) times daily as needed for allergies.  12/25/19 12/24/20  [provider]  Calcium 250 MG CAPS Take 250 mg by mouth daily.    [provider]  cetirizine (ZYRTEC) 10 MG tablet Take 10 mg by mouth daily as needed for allergies.     [provider]  Cholecalciferol (DIALYVITE VITAMIN D 5000) 125 MCG (5000 UT) capsule Take 5,000 Units by mouth daily.    [provider]  Coenzyme Q10 (COQ10) 200 MG CAPS Take 200 mg by mouth daily.    [provider]  cyclobenzaprine (FLEXERIL) 10  MG tablet Take 1 tablet by mouth every 8 (eight) hours as needed. 09/19/20   [provider]  diphenhydrAMINE (BENADRYL ALLERGY) 25 MG tablet Take 1 tablet (25 mg total) by mouth once for 1 dose. Benadryl 1 hour prior to scan 07/24/21 07/24/21  Cleon Gustin, MD  fluticasone [facility]) 50 MCG/ACT nasal spray Place 1 spray into the nose daily as needed for allergies.     [provider]  guaiFENesin (MUCINEX) 600 MG 12 hr tablet Take 600 mg by mouth every 12 (twelve) hours as needed (congestion).     [provider]  ibuprofen (ADVIL) 800 MG tablet Take 1 tablet (800 mg total) by mouth 3 (three) times daily. 03/15/22   Nyoka Lint, PA-C  lisinopril (ZESTRIL) 20 MG tablet Take 20 mg by mouth daily.  06/26/19   [provider]  lovastatin (MEVACOR) 20 MG tablet Take 20 mg by mouth at bedtime.    [provider]  magnesium oxide (MAG-OX) 400 MG tablet Take 400 mg by mouth every Monday, Wednesday, and Friday.    [provider]  metFORMIN (GLUCOPHAGE-XR) 500 MG 24 hr tablet Take 500 mg by mouth 2 (two) times daily.    [provider]  montelukast (SINGULAIR) 10 MG tablet Take 10 mg by mouth at bedtime.  04/07/20   [provider]  Multiple Vitamin (MULTIVITAMIN) tablet Take 1 tablet by mouth daily.    [provider]  Potassium 99 MG TABS Take 99 mg by mouth daily.    [provider]  predniSONE (DELTASONE) 10 MG tablet Take 1 tablet (10 mg total) by mouth 3 (three) times daily. 03/15/22   Nyoka Lint, PA-C  Probiotic Product (PROBIOTIC ADVANCED PO) Take 1 tablet by mouth daily.    [provider]  pseudoephedrine (SUDAFED) 30 MG tablet Take 30 mg by mouth every 4 (four) hours as needed for congestion.     [provider]  silodosin (RAPAFLO) 8 MG CAPS capsule Take 1 capsule (8 mg total) by mouth at bedtime. 11/21/21   McKenzie, Candee Furbish, MD  tadalafil (CIALIS) 20 MG tablet Take 1 tablet (20 mg total) by mouth daily as needed for erectile dysfunction. 01/30/22   McKenzie, Candee Furbish, MD  tamsulosin (FLOMAX) 0.4 MG CAPS capsule TAKE 1 CAPSULE BY MOUTH AT BEDTIME 02/10/21   McKenzie, Candee Furbish, MD  testosterone cypionate (DEPOTESTOSTERONE CYPIONATE) 200 MG/ML injection Inject 0.6 mLs (120 mg total) into the muscle every 7 (seven) days. 01/30/22   McKenzie, Candee Furbish, MD  Vibegron (GEMTESA) 75 MG TABS Take 1 capsule by mouth daily. 03/31/22   McKenzie, Candee Furbish, MD  vitamin B-12 (CYANOCOBALAMIN) 1000 MCG tablet Take 1,000 mcg by mouth daily.    [provider]    Family History Family History  Problem Relation Age of Onset   Colon cancer Father     Social History Social History   Tobacco Use   Smoking status: Never   Smokeless tobacco:  Never  Vaping Use   Vaping Use: Never used  Substance Use Topics   Alcohol use: Yes    Comment: occ   Drug use: No     Allergies   Ciprofloxacin, Contrast media [iodinated contrast media], Erythromycin, Ioxaglate, Metrizamide, Codeine, Hydrocodone-acetaminophen, and Metformin and related   Review of Systems Review of Systems  Cardiovascular:  Negative for chest pain.  All other systems reviewed and are negative.    Physical Exam Triage Vital Signs ED Triage Vitals  Enc Vitals  Group     BP 06/08/22 1702 (!) 140/97     Pulse Rate 06/08/22 1702 98     Resp 06/08/22 1702 16     Temp 06/08/22 1702 98.1 F (36.7 C)     Temp Source 06/08/22 2104 Oral     SpO2 06/08/22 1702 99 %     Weight 06/08/22 1706 229 lb 0.9 oz (103.9 kg)     Height 06/08/22 1706 '6\' 3"'$  (1.905 m)     Head Circumference --      Peak Flow --      Pain Score 06/08/22 2118 0     Pain Loc --      Pain Edu? --      Excl. in [place]? --    No data found.  Updated Vital Signs BP 125/85   Pulse 86   Temp 98 F (36.7 C) (Oral)   Resp 16   Ht '6\' 3"'$  (1.905 m)   Wt 103.9 kg   SpO2 99%   BMI 28.63 kg/m   Visual Acuity Right Eye Distance:   Left Eye Distance:   Bilateral Distance:    Right Eye Near:   Left Eye Near:    Bilateral Near:     Physical Exam Vitals and nursing note reviewed.  Constitutional:      Appearance: He is well-developed.  HENT:     Head: Normocephalic.  Pulmonary:     Effort: Pulmonary effort is normal.  Abdominal:     General: There is no distension.  Musculoskeletal:        General: Normal range of motion.     Cervical back: Normal range of motion.     Comments: 2cm abrasion left lower leg,  tender left shoulder, pain with range of motion nv and ns intact   Neurological:     Mental Status: He is alert and oriented to person, place, and time.      UC Treatments / Results  Labs (all labs ordered are listed, but only abnormal results are displayed) Labs Reviewed - No  data to display  EKG   Radiology No results found.  Procedures Procedures (including critical care time)  Medications Ordered in UC Medications  Tdap (BOOSTRIX) injection 0.5 mL (0.5 mLs Intramuscular Given 06/08/22 2101)    Initial Impression / Assessment and Plan / UC Course  I have reviewed the triage vital signs and the nursing notes.  Pertinent labs & imaging results that were available during my care of the patient were reviewed by me and considered in my medical decision making (see chart for details).     MDM:  Pt advised ibuprofen  , tetanus  given  Primary care notes reviewed Xray reviewed  Final Clinical Impressions(s) / UC Diagnoses   Final diagnoses:  Abrasion of left knee, initial encounter  Contusion of left shoulder, initial encounter   Discharge Instructions   None    ED Prescriptions   None    PDMP not reviewed this encounter.   Fransico Meadow, [place] 06/11/22 [phone]

## 2022-07-01 NOTE — ED Provider Notes (Signed)
[facility] EMERGENCY DEPT Provider Note   CSN: [id number] Arrival date & time: 06/08/22  1647     History  Chief Complaint  Patient presents with   [name] is a 61 y.o. male.  Pt complains of pain in his shoulder.  Pt reports he fell off of his porch.  Pt complains of pain to his left shoulder and his left knee.  Pt reports he did not strike his head.  No loss of consciousness.  Pt is not on a blood thinner .  Pt does not have any pain in his neck or back.  Pt denies chest or abdominal pain   The history is provided by the patient. No language interpreter was used.  Fall This is a new problem. The problem has not changed since onset.Pertinent negatives include no chest pain and no abdominal pain. Nothing aggravates the symptoms. Nothing relieves the symptoms. He has tried nothing for the symptoms. The treatment provided no relief.       Home Medications Prior to Admission medications   Medication Sig Start Date End Date Taking? Authorizing Provider  acetaminophen (TYLENOL) 500 MG tablet Take 1,000 mg by mouth every 8 (eight) hours as needed for moderate pain or headache.    [provider]  albuterol (VENTOLIN HFA) 108 (90 Base) MCG/ACT inhaler Inhale 2 puffs into the lungs every 4 (four) hours as needed for wheezing or shortness of breath.  12/22/19 12/21/20  [provider]  aspirin 81 MG EC tablet Take 162 mg by mouth daily.     [provider]  azelastine (ASTELIN) 0.1 % nasal spray Place 1 spray into both nostrils 2 (two) times daily as needed for allergies.  12/25/19 12/24/20  [provider]  Calcium 250 MG CAPS Take 250 mg by mouth daily.    [provider]  cetirizine (ZYRTEC) 10 MG tablet Take 10 mg by mouth daily as needed for allergies.     [provider]  Cholecalciferol (DIALYVITE VITAMIN D 5000) 125 MCG (5000 UT) capsule Take 5,000 Units by mouth daily.    [provider]   Coenzyme Q10 (COQ10) 200 MG CAPS Take 200 mg by mouth daily.    [provider]  cyclobenzaprine (FLEXERIL) 10 MG tablet Take 1 tablet by mouth every 8 (eight) hours as needed. 09/19/20   [provider]  diphenhydrAMINE (BENADRYL ALLERGY) 25 MG tablet Take 1 tablet (25 mg total) by mouth once for 1 dose. Benadryl 1 hour prior to scan 07/24/21 07/24/21  Cleon Gustin, MD  fluticasone [facility]) 50 MCG/ACT nasal spray Place 1 spray into the nose daily as needed for allergies.     [provider]  guaiFENesin (MUCINEX) 600 MG 12 hr tablet Take 600 mg by mouth every 12 (twelve) hours as needed (congestion).     [provider]  ibuprofen (ADVIL) 800 MG tablet Take 1 tablet (800 mg total) by mouth 3 (three) times daily. 03/15/22   Nyoka Lint, PA-C  lisinopril (ZESTRIL) 20 MG tablet Take 20 mg by mouth daily.  06/26/19   [provider]  lovastatin (MEVACOR) 20 MG tablet Take 20 mg by mouth at bedtime.    [provider]  magnesium oxide (MAG-OX) 400 MG tablet Take 400 mg by mouth every Monday, Wednesday, and Friday.    [provider]  metFORMIN (GLUCOPHAGE-XR) 500 MG 24 hr tablet Take 500 mg by mouth 2 (two) times daily.    [provider]  montelukast (SINGULAIR) 10 MG tablet Take 10 mg by mouth at bedtime.  04/07/20   [provider]  Multiple Vitamin (MULTIVITAMIN) tablet Take 1 tablet by mouth daily.    [provider]  Potassium 99 MG TABS Take 99 mg by mouth daily.    [provider]  predniSONE (DELTASONE) 10 MG tablet Take 1 tablet (10 mg total) by mouth 3 (three) times daily. 03/15/22   Nyoka Lint, PA-C  Probiotic Product (PROBIOTIC ADVANCED PO) Take 1 tablet by mouth daily.    [provider]  pseudoephedrine (SUDAFED) 30 MG tablet Take 30 mg by mouth every 4 (four) hours as needed for congestion.     [provider]  silodosin (RAPAFLO) 8 MG CAPS capsule Take 1 capsule (8  mg total) by mouth at bedtime. 11/21/21   McKenzie, Candee Furbish, MD  tadalafil (CIALIS) 20 MG tablet Take 1 tablet (20 mg total) by mouth daily as needed for erectile dysfunction. 01/30/22   McKenzie, Candee Furbish, MD  tamsulosin (FLOMAX) 0.4 MG CAPS capsule TAKE 1 CAPSULE BY MOUTH AT BEDTIME 02/10/21   McKenzie, Candee Furbish, MD  testosterone cypionate (DEPOTESTOSTERONE CYPIONATE) 200 MG/ML injection Inject 0.6 mLs (120 mg total) into the muscle every 7 (seven) days. 01/30/22   McKenzie, Candee Furbish, MD  Vibegron (GEMTESA) 75 MG TABS Take 1 capsule by mouth daily. 03/31/22   McKenzie, Candee Furbish, MD  vitamin B-12 (CYANOCOBALAMIN) 1000 MCG tablet Take 1,000 mcg by mouth daily.    [provider]      Allergies    Ciprofloxacin, Contrast media [iodinated contrast media], Erythromycin, Ioxaglate, Metrizamide, Codeine, Hydrocodone-acetaminophen, and Metformin and related    Review of Systems   Review of Systems  Cardiovascular:  Negative for chest pain.  Gastrointestinal:  Negative for abdominal pain.  All other systems reviewed and are negative.   Physical Exam Updated Vital Signs BP 125/85   Pulse 86   Temp 98 F (36.7 C) (Oral)   Resp 16   Ht '6\' 3"'$  (1.905 m)   Wt 103.9 kg   SpO2 99%   BMI 28.63 kg/m  Physical Exam Vitals and nursing note reviewed.  Constitutional:      Appearance: He is well-developed.  HENT:     Head: Normocephalic.  Pulmonary:     Effort: Pulmonary effort is normal.  Abdominal:     General: There is no distension.  Musculoskeletal:        General: Swelling and tenderness present. Normal range of motion.     Cervical back: Normal range of motion.     Comments: Ns i Nv Abrasion left knee, from Left shoulder, tender to palpation  pain with range of m  Neurological:     Mental Status: He is alert and oriented to person, place, and time.  Psychiatric:        Mood and Affect: Mood normal.     ED Results / Procedures / Treatments   Labs (all labs ordered are  listed, but only abnormal results are displayed) Labs Reviewed - No data to display  EKG None  Radiology No results found.  Procedures Procedures    Medications Ordered in ED Medications  Tdap (BOOSTRIX) injection 0.5 mL (0.5 mLs Intramuscular Given 06/08/22 2101)    ED Course/ Medical Decision Making/ A&P                           Medical Decision Making Pt  complains of left shoulder and left knee pain after falling   Amount and/or Complexity of Data Reviewed Radiology: ordered and independent interpretation performed. Decision-making details documented in ED Course.    Details: Xray left shoulder,  no fracture   Risk Prescription drug management. Risk Details: Pt advised to follow up with primary care for recheck.  Return if any problems            Final Clinical Impression(s) / ED Diagnoses Final diagnoses:  Abrasion of left knee, initial encounter  Contusion of left shoulder, initial encounter    Rx / DC Orders ED Discharge Orders     None     An After Visit Summary was printed and given to the patient.     Fransico Meadow, PA-C 58/68/25 [id number]    Campbell Stall P, DO 55/21/74 1603

## 2022-07-23 ENCOUNTER — Other Ambulatory Visit: Payer: BC Managed Care – PPO

## 2022-07-23 DIAGNOSIS — E291 Testicular hypofunction: Secondary | ICD-10-CM

## 2022-07-24 ENCOUNTER — Other Ambulatory Visit: Payer: BC Managed Care – PPO

## 2022-07-25 LAB — CBC WITH DIFFERENTIAL
Basophils Absolute: 0 x10E3/uL (ref 0.0–0.2)
Basos: 1 %
EOS (ABSOLUTE): 0.2 x10E3/uL (ref 0.0–0.4)
Eos: 3 %
Hematocrit: 51.8 % — ABNORMAL HIGH (ref 37.5–51.0)
Hemoglobin: 17.5 g/dL (ref 13.0–17.7)
Immature Grans (Abs): 0 x10E3/uL (ref 0.0–0.1)
Immature Granulocytes: 0 %
Lymphocytes Absolute: 1.5 x10E3/uL (ref 0.7–3.1)
Lymphs: 30 %
MCH: 30.5 pg (ref 26.6–33.0)
MCHC: 33.8 g/dL (ref 31.5–35.7)
MCV: 90 fL (ref 79–97)
Monocytes Absolute: 0.3 x10E3/uL (ref 0.1–0.9)
Monocytes: 7 %
Neutrophils Absolute: 3.1 x10E3/uL (ref 1.4–7.0)
Neutrophils: 59 %
RBC: 5.74 x10E6/uL (ref 4.14–5.80)
RDW: 13.4 % (ref 11.6–15.4)
WBC: 5.2 x10E3/uL (ref 3.4–10.8)

## 2022-07-25 LAB — COMPREHENSIVE METABOLIC PANEL WITH GFR
ALT: 29 IU/L (ref 0–44)
AST: 19 IU/L (ref 0–40)
Albumin/Globulin Ratio: 2.4 — ABNORMAL HIGH (ref 1.2–2.2)
Albumin: 4.8 g/dL (ref 3.8–4.9)
Alkaline Phosphatase: 77 IU/L (ref 44–121)
BUN/Creatinine Ratio: 12 (ref 9–20)
BUN: 14 mg/dL (ref 6–24)
Bilirubin Total: 0.3 mg/dL (ref 0.0–1.2)
CO2: 22 mmol/L (ref 20–29)
Calcium: 9.3 mg/dL (ref 8.7–10.2)
Chloride: 101 mmol/L (ref 96–106)
Creatinine, Ser: 1.18 mg/dL (ref 0.76–1.27)
Globulin, Total: 2 g/dL (ref 1.5–4.5)
Glucose: 158 mg/dL — ABNORMAL HIGH (ref 70–99)
Potassium: 4.4 mmol/L (ref 3.5–5.2)
Sodium: 140 mmol/L (ref 134–144)
Total Protein: 6.8 g/dL (ref 6.0–8.5)
eGFR: 71 mL/min/1.73 (ref >59)

## 2022-07-25 LAB — TESTOSTERONE,FREE AND TOTAL
Testosterone, Free: 17.2 pg/mL (ref 7.2–24.0)
Testosterone: 789 ng/dL (ref 264–916)

## 2022-07-31 ENCOUNTER — Encounter: Payer: Self-pay | Admitting: Urology

## 2022-07-31 ENCOUNTER — Ambulatory Visit (INDEPENDENT_AMBULATORY_CARE_PROVIDER_SITE_OTHER): Payer: BC Managed Care – PPO | Admitting: Urology

## 2022-07-31 VITALS — BP 115/81 | HR 77

## 2022-07-31 DIAGNOSIS — N401 Enlarged prostate with lower urinary tract symptoms: Secondary | ICD-10-CM | POA: Diagnosis not present

## 2022-07-31 DIAGNOSIS — E291 Testicular hypofunction: Secondary | ICD-10-CM

## 2022-07-31 DIAGNOSIS — N5201 Erectile dysfunction due to arterial insufficiency: Secondary | ICD-10-CM | POA: Diagnosis not present

## 2022-07-31 DIAGNOSIS — R350 Frequency of micturition: Secondary | ICD-10-CM | POA: Diagnosis not present

## 2022-07-31 MED ORDER — GEMTESA 75 MG PO TABS: 1.0000 | ORAL_TABLET | Freq: Every day | ORAL | 3 refills | Status: AC

## 2022-07-31 MED ORDER — TESTOSTERONE CYPIONATE 200 MG/ML IM SOLN: 120.0000 mg | INTRAMUSCULAR | 3 refills | Status: DC

## 2022-07-31 NOTE — Progress Notes (Signed)
07/31/2022 8:50 AM   [name] [birth year] [id number]  Referring provider: Dion Body, MD [facility] [place],  [place] [address]  Followup hypogonadism and BPH   HPI: Mr Elms is a 61yo here followup for hypogonadism and BPH. Testosterone 789, hgb 17.5. CMP normal. His urinary frequency and urgency has resolved with Gemtesa. Urine stream strong. No strainign to urinate.IPSS 3 QOl 1. NO issues with erectile dysfunction. He does not require PDE5s.    PMH: Past Medical History:  Diagnosis Date   BPH (benign prostatic hyperplasia)    Diabetes mellitus without complication ([place])    Fatty liver    GERD (gastroesophageal reflux disease)    no meds   History of hiatal hernia    Hyperlipidemia    Hypertension    Low testosterone    Medical history non-contributory    Polycythemia    Sleep apnea    uses a cpap nightly    Surgical History: Past Surgical History:  Procedure Laterality Date   COLONOSCOPY     I & D EXTREMITY Left 06/20/2013   Procedure: MINOR IRRIGATION AND DEBRIDEMENT LEFT INDEX FINGER;  Surgeon: Tennis Must, MD;  Location: [place];  Service: Orthopedics;  Laterality: Left;   NASAL SINUS SURGERY     UPPER GASTROINTESTINAL ENDOSCOPY     WISDOM TOOTH EXTRACTION      Home Medications:  Allergies as of 07/31/2022       Reactions   Ciprofloxacin Itching   Contrast Media [iodinated Contrast Media] Itching   Gets hot   Erythromycin    Stomach pain   Ioxaglate Itching   Metrizamide Itching   Codeine Itching   Hydrocodone-acetaminophen Itching   Metformin And Related Diarrhea   The plain metformin caused severe diarrhea but the ER version doesnt        Medication List        Accurate as of July 31, 2022  8:50 AM. If you have any questions, ask your nurse or doctor.          acetaminophen 500 MG tablet Commonly known as: TYLENOL Take 1,000 mg by mouth every 8 (eight) hours as  needed for moderate pain or headache.   albuterol 108 (90 Base) MCG/ACT inhaler Commonly known as: VENTOLIN HFA Inhale 2 puffs into the lungs every 4 (four) hours as needed for wheezing or shortness of breath.   aspirin EC 81 MG tablet Take 162 mg by mouth daily.   azelastine 0.1 % nasal spray Commonly known as: ASTELIN Place 1 spray into both nostrils 2 (two) times daily as needed for allergies.   Calcium 250 MG Caps Take 250 mg by mouth daily.   cetirizine 10 MG tablet Commonly known as: ZYRTEC Take 10 mg by mouth daily as needed for allergies.   CoQ10 200 MG Caps Take 200 mg by mouth daily.   cyanocobalamin 1000 MCG tablet Commonly known as: VITAMIN B12 Take 1,000 mcg by mouth daily.   cyclobenzaprine 10 MG tablet Commonly known as: FLEXERIL Take 1 tablet by mouth every 8 (eight) hours as needed.   Dialyvite Vitamin D 5000 125 MCG (5000 UT) capsule Generic drug: Cholecalciferol Take 5,000 Units by mouth daily.   diphenhydrAMINE 25 MG tablet Commonly known as: Benadryl Allergy Take 1 tablet (25 mg total) by mouth once for 1 dose. Benadryl 1 hour prior to scan   fluticasone 50 MCG/ACT nasal spray Commonly known as: FLONASE Place 1 spray into the nose  daily as needed for allergies.   Gemtesa 75 MG Tabs Generic drug: Vibegron Take 1 capsule by mouth daily.   guaiFENesin 600 MG 12 hr tablet Commonly known as: MUCINEX Take 600 mg by mouth every 12 (twelve) hours as needed (congestion).   ibuprofen 800 MG tablet Commonly known as: ADVIL Take 1 tablet (800 mg total) by mouth 3 (three) times daily.   lisinopril 20 MG tablet Commonly known as: ZESTRIL Take 20 mg by mouth daily.   lovastatin 20 MG tablet Commonly known as: MEVACOR Take 20 mg by mouth at bedtime.   magnesium oxide 400 MG tablet Commonly known as: MAG-OX Take 400 mg by mouth every Monday, Wednesday, and Friday.   metFORMIN 500 MG 24 hr tablet Commonly known as: GLUCOPHAGE-XR Take 500 mg by  mouth 2 (two) times daily.   montelukast 10 MG tablet Commonly known as: SINGULAIR Take 10 mg by mouth at bedtime.   multivitamin tablet Take 1 tablet by mouth daily.   Potassium 99 MG Tabs Take 99 mg by mouth daily.   predniSONE 10 MG tablet Commonly known as: DELTASONE Take 1 tablet (10 mg total) by mouth 3 (three) times daily.   PROBIOTIC ADVANCED PO Take 1 tablet by mouth daily.   pseudoephedrine 30 MG tablet Commonly known as: SUDAFED Take 30 mg by mouth every 4 (four) hours as needed for congestion.   silodosin 8 MG Caps capsule Commonly known as: RAPAFLO Take 1 capsule (8 mg total) by mouth at bedtime.   tadalafil 20 MG tablet Commonly known as: CIALIS Take 1 tablet (20 mg total) by mouth daily as needed for erectile dysfunction.   tamsulosin 0.4 MG Caps capsule Commonly known as: FLOMAX TAKE 1 CAPSULE BY MOUTH AT BEDTIME   testosterone cypionate 200 MG/ML injection Commonly known as: DEPOTESTOSTERONE CYPIONATE Inject 0.6 mLs (120 mg total) into the muscle every 7 (seven) days.        Allergies:  Allergies  Allergen Reactions   Ciprofloxacin Itching   Contrast Media [Iodinated Contrast Media] Itching    Gets hot   Erythromycin     Stomach pain   Ioxaglate Itching   Metrizamide Itching   Codeine Itching   Hydrocodone-Acetaminophen Itching   Metformin And Related Diarrhea    The plain metformin caused severe diarrhea but the ER version doesnt    Family History: Family History  Problem Relation Age of Onset   Colon cancer Father     Social History:  reports that he has never smoked. He has never used smokeless tobacco. He reports current alcohol use. He reports that he does not use drugs.  ROS: All other review of systems were reviewed and are negative except what is noted above in HPI  Physical Exam: BP 115/81   Pulse 77   Constitutional:  Alert and oriented, No acute distress. HEENT: [place] AT, moist mucus membranes.  Trachea midline, no  masses. Cardiovascular: No clubbing, cyanosis, or edema. Respiratory: Normal respiratory effort, no increased work of breathing. GI: Abdomen is soft, nontender, nondistended, no abdominal masses GU: No CVA tenderness.  Lymph: No cervical or inguinal lymphadenopathy. Skin: No rashes, bruises or suspicious lesions. Neurologic: Grossly intact, no focal deficits, moving all 4 extremities. Psychiatric: Normal mood and affect.  Laboratory Data: Lab Results  Component Value Date   WBC 5.2 07/23/2022   HGB 17.5 07/23/2022   HCT 51.8 (H) 07/23/2022   MCV 90 07/23/2022   PLT 169 05/27/2021    Lab Results  Component Value  Date   CREATININE 1.18 07/23/2022    No results found for: "PSA"  Lab Results  Component Value Date   TESTOSTERONE 789 07/23/2022    Lab Results  Component Value Date   HGBA1C 6.4 (H) 05/22/2021    Urinalysis    Component Value Date/Time   COLORURINE AMBER (A) 05/22/2021 0833   APPEARANCEUR Clear 01/30/2022 1006   LABSPEC 1.032 (H) 05/22/2021 0833   LABSPEC 1.015 11/03/2013 1218   PHURINE 5.0 05/22/2021 0833   GLUCOSEU Trace (A) 01/30/2022 1006   GLUCOSEU NEGATIVE 11/03/2013 1218   HGBUR MODERATE (A) 05/22/2021 0833   BILIRUBINUR Negative 01/30/2022 1006   BILIRUBINUR Negative 11/13/2011 1910   KETONESUR 5 (A) 05/22/2021 0833   PROTEINUR 1+ (A) 01/30/2022 1006   PROTEINUR >=300 (A) 05/22/2021 0833   UROBILINOGEN 2.0 (H) 08/23/2014 2100   NITRITE Negative 01/30/2022 1006   NITRITE NEGATIVE 05/22/2021 0833   LEUKOCYTESUR Negative 01/30/2022 1006   LEUKOCYTESUR MODERATE (A) 05/22/2021 0833   LEUKOCYTESUR Negative 11/13/2011 1910    Lab Results  Component Value Date   LABMICR See below: 01/30/2022   WBCUA None seen 01/30/2022   LABEPIT None seen 01/30/2022   MUCUS Present 01/30/2022   BACTERIA None seen 01/30/2022    Pertinent Imaging:  No results found for this or any previous visit.  No results found for this or any previous visit.  No  results found for this or any previous visit.  No results found for this or any previous visit.  No results found for this or any previous visit.  No valid procedures specified. No results found for this or any previous visit.  Results for orders placed during the hospital encounter of 05/22/21  CT Renal Stone Study  Narrative CLINICAL DATA:  Bilateral flank pain.  Dysuria.  EXAM: CT ABDOMEN AND PELVIS WITHOUT CONTRAST  TECHNIQUE: Multidetector CT imaging of the abdomen and pelvis was performed following the standard protocol without IV contrast.  COMPARISON:  01/13/2021  FINDINGS: Lower chest: Clear lung bases. Normal heart size without pericardial or pleural effusion.  Hepatobiliary: Moderate hepatic steatosis. Normal gallbladder, without biliary ductal dilatation.  Pancreas: Normal, without mass or ductal dilatation.  Spleen: Normal in size, without focal abnormality.  Adrenals/Urinary Tract: Normal adrenal glands. Punctate interpolar left renal collecting system calculus. No right renal calculi. No hydronephrosis. No hydroureter or ureteric calculi. No bladder calculi.  Stomach/Bowel: Small bowel malrotation again identified. Normal position of the ileocecal junction. Appendix is not visualized but there is no evidence of right lower quadrant inflammation. Otherwise normal small bowel.  Vascular/Lymphatic: Aortic atherosclerosis. No abdominopelvic adenopathy.  Reproductive: Mild prostatomegaly.  Other: No significant free fluid.  No free intraperitoneal air.  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 Lower thoracic and less so upper lumbar spondylosis.  IMPRESSION: 1. Left nephrolithiasis. No obstructive uropathy. 2. Hepatic steatosis. 3. Small bowel malrotation, as before. 4. Prostatomegaly. 5. Aortic Atherosclerosis (ICD10-I70.0).   Electronically Signed By: Abigail Miyamoto M.D. On: 05/22/2021  10:35   Assessment & Plan:    1. Urinary frequency -continue gemtesa '75mg'$  - Urinalysis, Routine w reflex microscopic  2. Benign localized prostatic hyperplasia with lower urinary tract symptoms (LUTS) -continue gemtesa '75mg'$  daily  3. Male hypogonadism -continue IM testosterone '120mg'$  every week -followup 6 months with testosterone labs  4. Erectile dysfunction due to arterial insufficiency -resolved   No follow-ups on file.  Nicolette Bang, MD  [facility] [place]

## 2022-07-31 NOTE — Addendum Note (Signed)
Addended by: Darcella Gasman R on: 07/31/2022 09:14 AM   Modules accepted: Orders

## 2022-07-31 NOTE — Patient Instructions (Signed)

## 2022-08-04 ENCOUNTER — Encounter: Payer: Self-pay | Admitting: Urology

## 2022-09-08 ENCOUNTER — Other Ambulatory Visit: Payer: Self-pay

## 2022-09-08 DIAGNOSIS — N412 Abscess of prostate: Secondary | ICD-10-CM

## 2022-09-11 ENCOUNTER — Ambulatory Visit (INDEPENDENT_AMBULATORY_CARE_PROVIDER_SITE_OTHER): Payer: BC Managed Care – PPO | Admitting: Urology

## 2022-09-11 ENCOUNTER — Telehealth: Payer: Self-pay

## 2022-09-11 DIAGNOSIS — N412 Abscess of prostate: Secondary | ICD-10-CM

## 2022-09-11 DIAGNOSIS — R350 Frequency of micturition: Secondary | ICD-10-CM | POA: Diagnosis not present

## 2022-09-11 DIAGNOSIS — N401 Enlarged prostate with lower urinary tract symptoms: Secondary | ICD-10-CM | POA: Diagnosis not present

## 2022-09-11 LAB — MICROSCOPIC EXAMINATION
Bacteria, UA: NONE SEEN
RBC, Urine: NONE SEEN /hpf (ref 0–2)

## 2022-09-11 LAB — URINALYSIS, ROUTINE W REFLEX MICROSCOPIC
Bilirubin, UA: NEGATIVE
Leukocytes,UA: NEGATIVE
Nitrite, UA: NEGATIVE
RBC, UA: NEGATIVE
Specific Gravity, UA: 1.025 (ref 1.005–1.030)
Urobilinogen, Ur: 0.2 mg/dL (ref 0.2–1.0)
pH, UA: 5.5 (ref 5.0–7.5)

## 2022-09-11 NOTE — Progress Notes (Cosign Needed Addendum)
Patient did a urine drop off. Order UC and UA. UC pending

## 2022-09-11 NOTE — Telephone Encounter (Signed)
Urine review with Dr. Alyson Ingles. Made patient aware that his urine was good but he need to get his  diabetes under control and his urine was sent for a culture. Made patient aware that if the culture shows an infection, treatment will be started and someone will contact him and let him know about his culture. Patient voiced understanding .

## 2022-09-12 LAB — PSA: Prostate Specific Ag, Serum: 2.8 ng/mL (ref 0.0–4.0)

## 2022-09-13 LAB — URINE CULTURE

## 2022-09-14 NOTE — Progress Notes (Signed)
Appeals letter sent along with ON and recent PSA results.

## 2022-10-01 ENCOUNTER — Telehealth: Payer: Self-pay

## 2022-10-01 DIAGNOSIS — E291 Testicular hypofunction: Secondary | ICD-10-CM

## 2022-10-01 NOTE — Telephone Encounter (Signed)
Pharmacy is requesting 84-90 day supply of testosterone since the are a mail order pharmacy.  Can you please update rx and resend to pharmacy.

## 2022-10-05 ENCOUNTER — Other Ambulatory Visit: Payer: Self-pay

## 2022-10-05 DIAGNOSIS — R351 Nocturia: Secondary | ICD-10-CM

## 2022-10-05 DIAGNOSIS — N401 Enlarged prostate with lower urinary tract symptoms: Secondary | ICD-10-CM

## 2022-10-05 MED ORDER — SILODOSIN 8 MG PO CAPS: 8.0000 mg | ORAL_CAPSULE | Freq: Every day | ORAL | 11 refills | Status: AC

## 2022-10-05 NOTE — Progress Notes (Signed)
Pt requested a refill of silodosin.  Refill sent to pharmacy.  Patient notified via mychart.

## 2022-10-08 MED ORDER — TESTOSTERONE CYPIONATE 200 MG/ML IM SOLN: 120.0000 mg | INTRAMUSCULAR | 3 refills | Status: DC

## 2022-10-26 ENCOUNTER — Encounter: Payer: Self-pay | Admitting: Urology

## 2022-10-27 ENCOUNTER — Other Ambulatory Visit: Payer: Self-pay | Admitting: Urology

## 2022-10-27 DIAGNOSIS — E291 Testicular hypofunction: Secondary | ICD-10-CM

## 2022-10-27 MED ORDER — TESTOSTERONE CYPIONATE 200 MG/ML IM SOLN: 120.0000 mg | INTRAMUSCULAR | 2 refills | Status: DC

## 2022-10-28 ENCOUNTER — Other Ambulatory Visit: Payer: Self-pay | Admitting: Urology

## 2022-10-28 ENCOUNTER — Telehealth: Payer: Self-pay

## 2022-10-28 DIAGNOSIS — E291 Testicular hypofunction: Secondary | ICD-10-CM

## 2022-10-28 MED ORDER — TESTOSTERONE CYPIONATE 200 MG/ML IM SOLN: 120.0000 mg | INTRAMUSCULAR | 1 refills | Status: DC

## 2022-10-28 NOTE — Telephone Encounter (Signed)
Patient called advising that the insurance will only cover a 90 day supply. He would like medication called in for that amount.    Medication: testosterone cypionate (DEPOTESTOSTERONE CYPIONATE) 200 MG/ML injection    Pharmacy: [facility]) [place], [place]

## 2022-10-28 NOTE — Telephone Encounter (Signed)
Return call to [place]. Spoke with Delcie Roch and [place] regarding a rx that was sent over. Both Delcie Roch and [place] voiced that Rx for 12 single vital with 1 refill will cover the 90 day supplies. Made both Delcie Roch and Shawn aware that  the Rx will be sent to the provider. Raquel Sarna and Delcie Roch voiced understanding

## 2022-11-02 ENCOUNTER — Other Ambulatory Visit: Payer: Self-pay

## 2022-11-02 DIAGNOSIS — E291 Testicular hypofunction: Secondary | ICD-10-CM

## 2022-11-02 NOTE — Telephone Encounter (Signed)
Received voice message about patient Testerone injection. Hassan Rowan from [place] states that they need a Rx for 12 ml single vital which will cover the 90 days supplies.

## 2022-11-03 ENCOUNTER — Telehealth: Payer: Self-pay

## 2022-11-03 MED ORDER — TESTOSTERONE CYPIONATE 200 MG/ML IM SOLN: 120.0000 mg | INTRAMUSCULAR | 5 refills | Status: AC

## 2022-11-03 NOTE — Telephone Encounter (Signed)
Return call to patient's pharmacy regarding Testerone Rx. [place] from [place] made me aware that the patient was requesting 10 ml single vital through online request. [place] apologizes for the miscommunication and voiced that she would put a block on the online access prescription request for the 10 ml single vitals.  Testerone Rx written on 10/28/22 is the correct prescription for future Rx orders. [place] is aware that a message will be sent to MD and she voiced understanding

## 2022-11-29 IMAGING — CT CT ABD-PELV W/ CM
2 of 5 series · 13 of 46 positions shown, 15 images · IV contrast (APPLIED)
Comparison: CT Abdomen and Pelvis 05/22/2021 and earlier.
COMPARISON: CT Abdomen and Pelvis 05/22/2021 and earlier.

Addendum:
CLINICAL DATA: 60-year-old male with UTI sepsis. Left
nephrolithiasis on recent noncontrast CT Abdomen and Pelvis.
Premedicated for contrast with no adverse reactions noted.

EXAM:
CT ABDOMEN AND PELVIS WITH CONTRAST
TECHNIQUE: Multidetector CT imaging of the abdomen and pelvis was performed
using the standard protocol following bolus administration of
intravenous contrast.
CONTRAST:  100mL OMNIPAQUE IOHEXOL 350 MG/ML SOLN

[Series 2: routine abd/pel with · axial · 0.83mm/px · z∈[-1179,-694]mm · 10 of 109 slices shown, 12 images]
[im 6/109  soft-tissue]
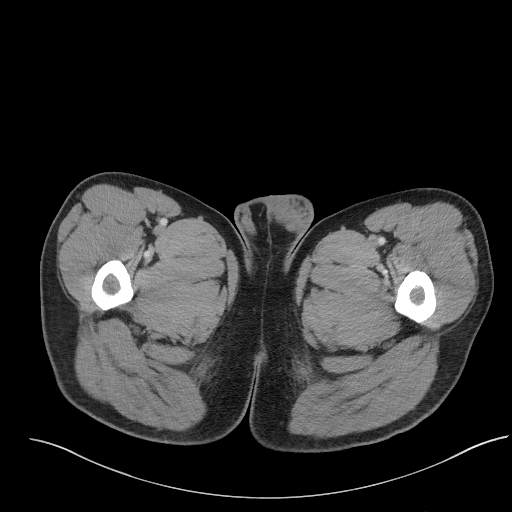
[im 6/109  bone]
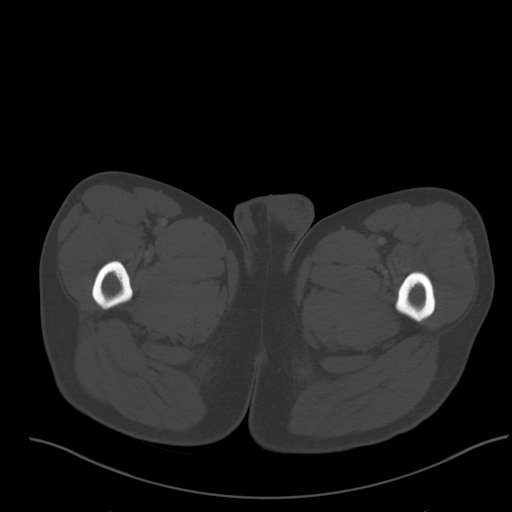
[im 18/109  soft-tissue]
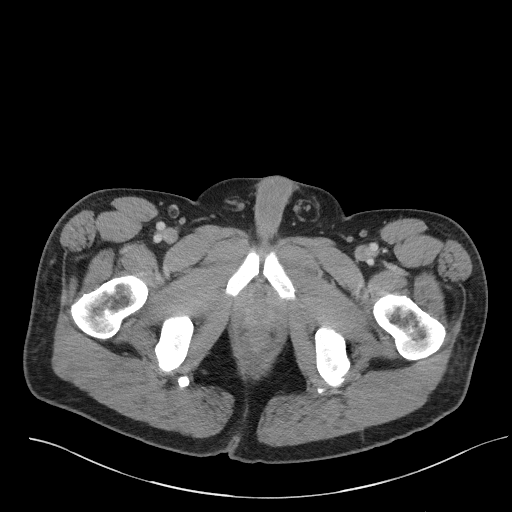
[im 29/109  soft-tissue]
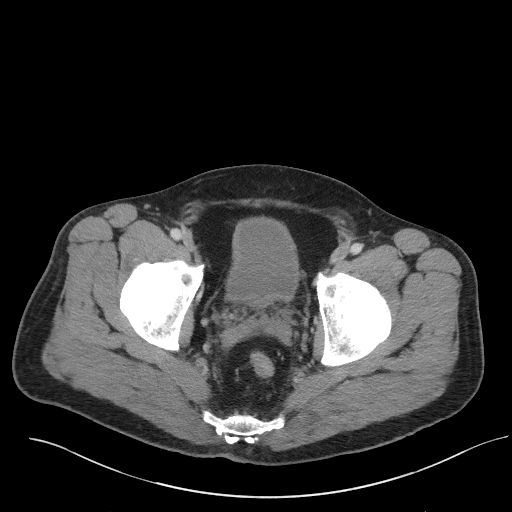
[im 40/109  soft-tissue]
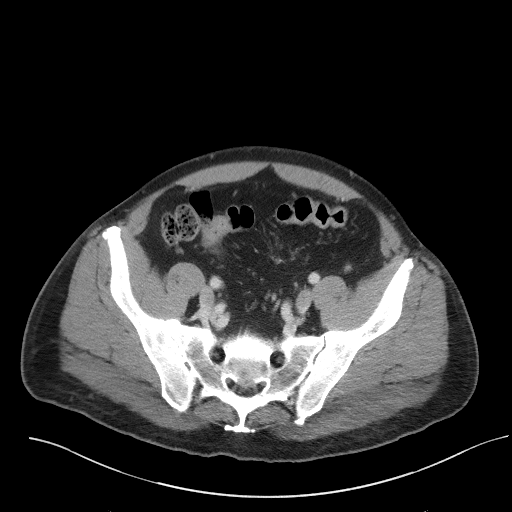
[im 52/109  soft-tissue]
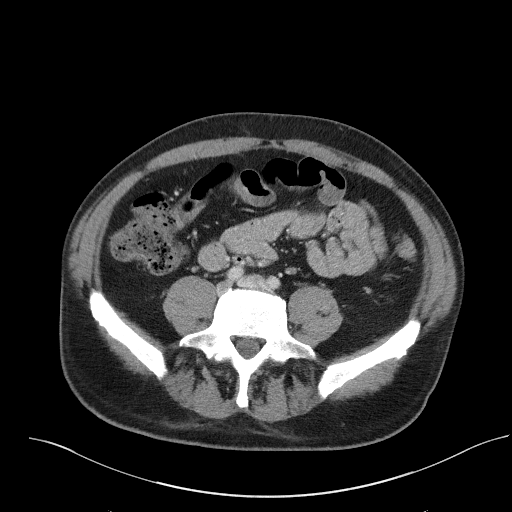
[im 57/109  soft-tissue]
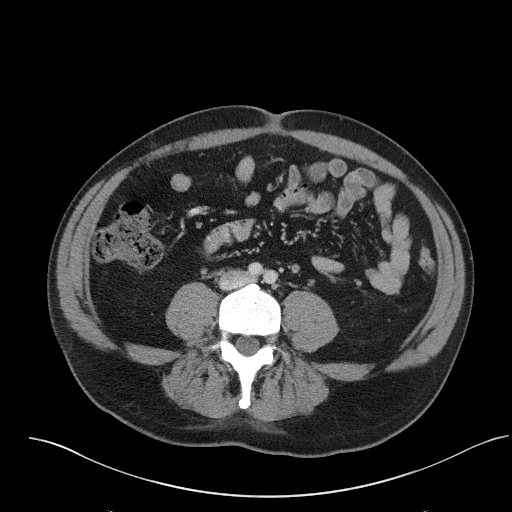
[im 69/109  soft-tissue]
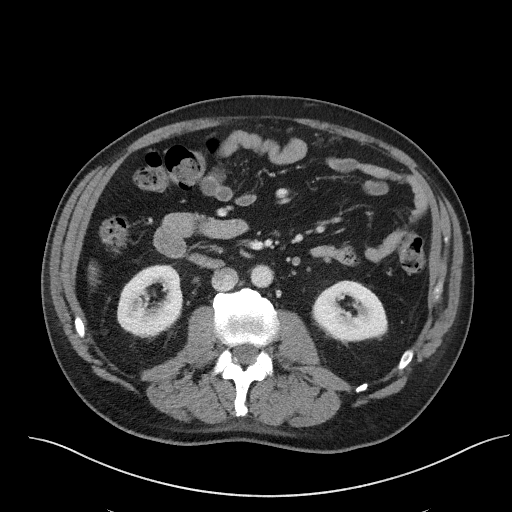
[im 80/109  soft-tissue]
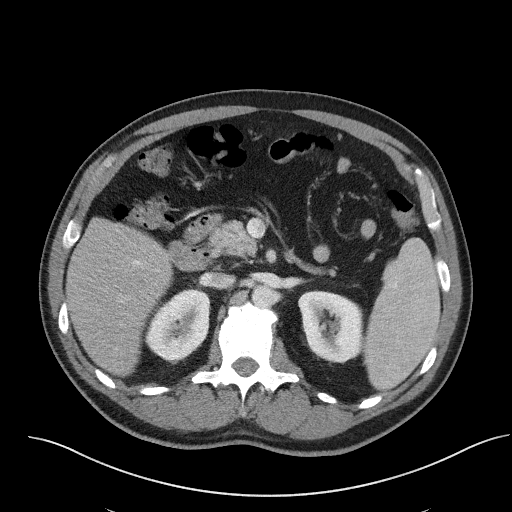
[im 91/109  soft-tissue]
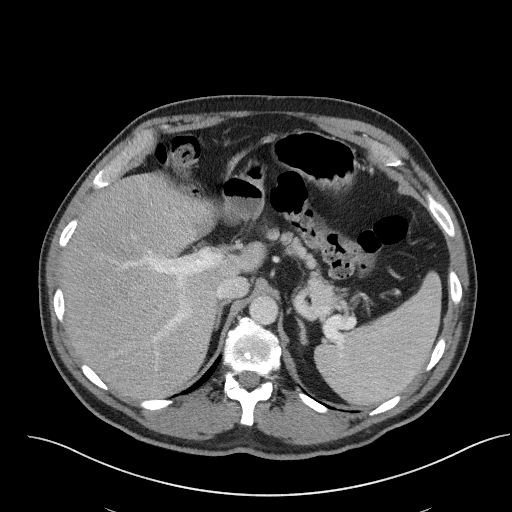
[im 91/109  bone]
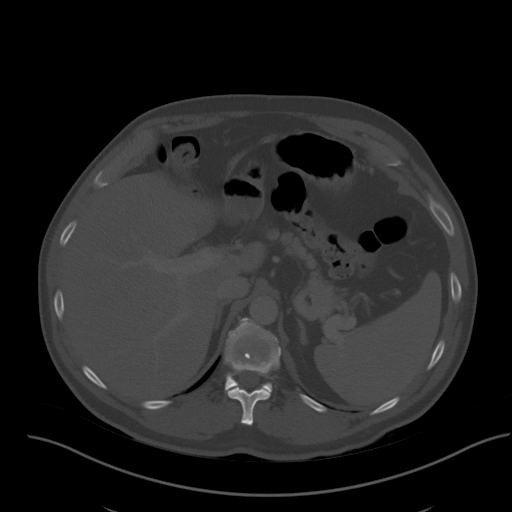
[im 103/109  soft-tissue]
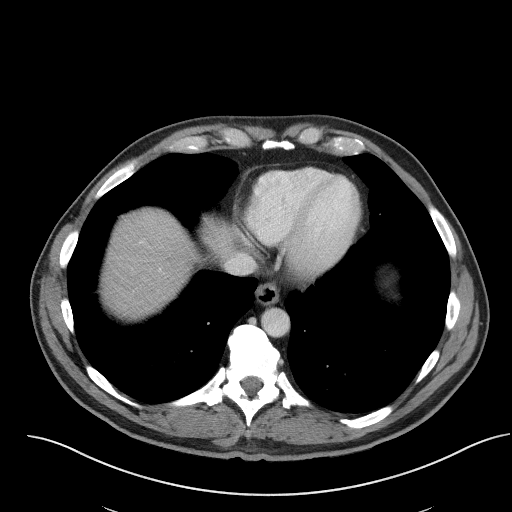

[Series 5: coronal st · coronal · 0.78mm/px · 3 of 106 slices shown]
[im 36/106  soft-tissue]
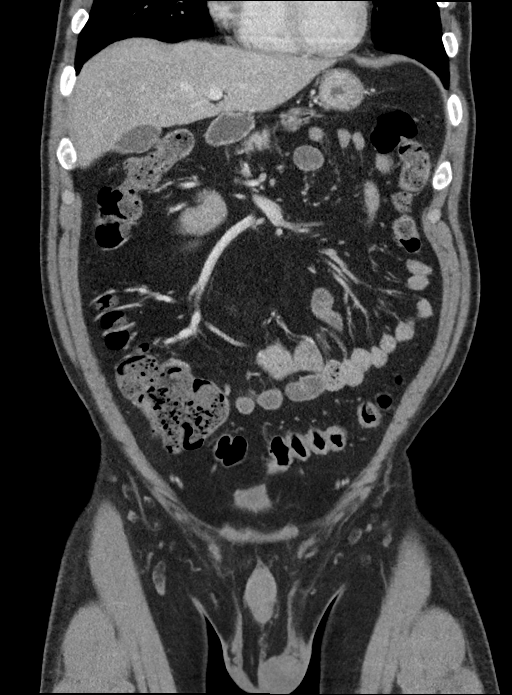
[im 47/106  soft-tissue]
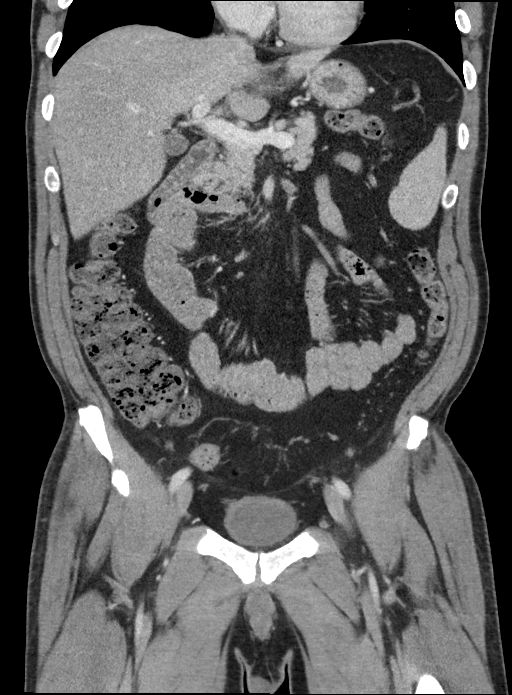
[im 59/106  soft-tissue]
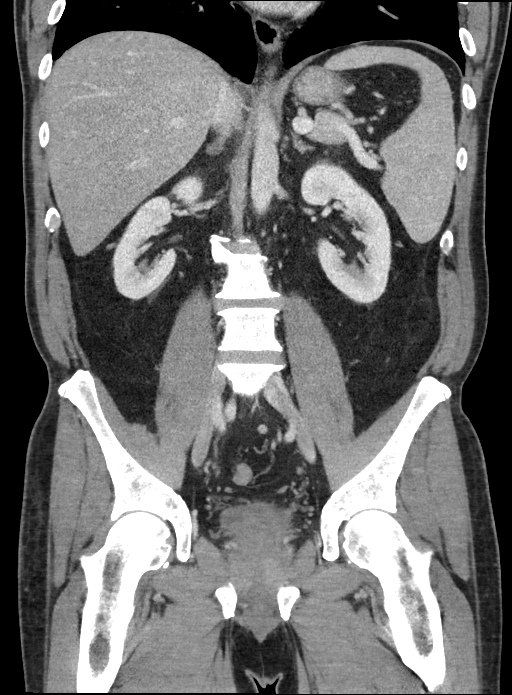

[13 of 46 positions shown; findings below may reference images not displayed]

FINDINGS: Lower chest: Negative.

Hepatobiliary: Possible hepatic steatosis. Otherwise negative liver
and gallbladder.

Pancreas: Negative.

Spleen: Negative.

Adrenals/Urinary Tract: Normal adrenal glands. Symmetric renal
enhancement and contrast excretion with no obstructive uropathy. No
evidence of renal infection. Punctate left midpole nephrolithiasis
redemonstrated (series 2, image 34). Both ureters appear negative.
Bladder is diminutive, within normal limits.

Stomach/Bowel: Distal large bowel appears stable and within normal
limits. Occasional diverticula in the descending colon. Redundant
transverse colon. Retained stool but no active inflammation.
Terminal ileum is decompressed and negative. Diminutive and normal
appendix medial to the cecum on coronal image 42.

No dilated small bowel. The duodenum does not cross midline.
Proximal small bowel loops are in the right abdomen. But no free
air, free fluid, or mesenteric inflammation identified.

Vascular/Lymphatic: Major arterial structures are patent. No
lymphadenopathy. Portal venous system is patent.

Reproductive: Prostatomegaly, increased since 8189. Heterogeneous
prostate parenchyma with the benefit of contrast today, including a
small 12 mm rim enhancing low-density area in the right lower
prostate seen on coronal image 67. No obvious regional soft tissue
inflammation.

Other: No pelvic free fluid.

Musculoskeletal: No acute osseous abnormality identified. Occasional
benign bone islands are stable since 8189.
IMPRESSION: 1. Prostatomegaly, with heterogeneous prostate parenchyma including
a small 1.2 cm rim enhancing low-density area in the right lower
prostate (series 5, image 67). Acute Prostatitis and/or small
Prostate Abscess not excluded.

2. But no evidence of renal infection or obstructive uropathy.
Punctate left nephrolithiasis again noted.

3. No other acute or inflammatory process identified in the abdomen
or pelvis.

ADDENDUM:
There should also be an Impression #4 which reads:

"4.  Congenital midgut malrotation."

*** End of Addendum ***
FINDINGS: Lower chest: Negative.

Hepatobiliary: Possible hepatic steatosis. Otherwise negative liver
and gallbladder.

Pancreas: Negative.

Spleen: Negative.

Adrenals/Urinary Tract: Normal adrenal glands. Symmetric renal
enhancement and contrast excretion with no obstructive uropathy. No
evidence of renal infection. Punctate left midpole nephrolithiasis
redemonstrated (series 2, image 34). Both ureters appear negative.
Bladder is diminutive, within normal limits.

Stomach/Bowel: Distal large bowel appears stable and within normal
limits. Occasional diverticula in the descending colon. Redundant
transverse colon. Retained stool but no active inflammation.
Terminal ileum is decompressed and negative. Diminutive and normal
appendix medial to the cecum on coronal image 42.

No dilated small bowel. The duodenum does not cross midline.
Proximal small bowel loops are in the right abdomen. But no free
air, free fluid, or mesenteric inflammation identified.

Vascular/Lymphatic: Major arterial structures are patent. No
lymphadenopathy. Portal venous system is patent.

Reproductive: Prostatomegaly, increased since 8189. Heterogeneous
prostate parenchyma with the benefit of contrast today, including a
small 12 mm rim enhancing low-density area in the right lower
prostate seen on coronal image 67. No obvious regional soft tissue
inflammation.

Other: No pelvic free fluid.

Musculoskeletal: No acute osseous abnormality identified. Occasional
benign bone islands are stable since 8189.
IMPRESSION: 1. Prostatomegaly, with heterogeneous prostate parenchyma including
a small 1.2 cm rim enhancing low-density area in the right lower
prostate (series 5, image 67). Acute Prostatitis and/or small
Prostate Abscess not excluded.

2. But no evidence of renal infection or obstructive uropathy.
Punctate left nephrolithiasis again noted.

3. No other acute or inflammatory process identified in the abdomen
or pelvis.

## 2023-01-22 ENCOUNTER — Other Ambulatory Visit: Payer: BC Managed Care – PPO

## 2023-01-29 ENCOUNTER — Ambulatory Visit: Payer: BC Managed Care – PPO | Admitting: Urology

## 2023-02-01 ENCOUNTER — Ambulatory Visit: Payer: BC Managed Care – PPO | Admitting: Urology

## 2023-02-02 ENCOUNTER — Other Ambulatory Visit: Payer: Self-pay

## 2023-02-02 DIAGNOSIS — R300 Dysuria: Secondary | ICD-10-CM

## 2023-02-02 DIAGNOSIS — N200 Calculus of kidney: Secondary | ICD-10-CM

## 2023-02-12 ENCOUNTER — Ambulatory Visit
Admission: RE | Admit: 2023-02-12 | Discharge: 2023-02-12 | Disposition: A | Discharge status: Home / Self Care | Payer: BC Managed Care – PPO | Source: Ambulatory Visit | Attending: Internal Medicine | Admitting: Internal Medicine

## 2023-02-12 DIAGNOSIS — R300 Dysuria: Secondary | ICD-10-CM | POA: Diagnosis not present

## 2023-02-12 DIAGNOSIS — N200 Calculus of kidney: Secondary | ICD-10-CM | POA: Insufficient documentation

## 2023-02-17 NOTE — Progress Notes (Unsigned)
Cardiology Office Note  Date:  02/18/2023   ID:  [name], DOB 21-Feb-1961, MRN [id number]  PCP:  Marisue Ivan, MD   Chief Complaint  Patient presents with   New Patient (Initial Visit)    Self referral for evaluation of new palpitations.  Was seen by [facility] in early 2000's.      HPI:  [name] is a 62 year old gentleman with past medical history of Diabetes Hypertension Sleep apnea Hyperlipidemia Who presents by referral from Dr. 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 rate has decreased BY  57 BPM Confirmed by Julien Nordmann [phone]) on 02/18/2023 4:16:13 PM     PMH:   has a past medical history of BPH (benign prostatic hyperplasia), Diabetes mellitus without complication (HCC), Fatty liver, GERD (gastroesophageal reflux disease), History of hiatal hernia, History of polycythemia, Hyperlipidemia, Hypertension, Low testosterone, Medical history non-contributory, Polycythemia, and Sleep apnea.  PSH:    Past Surgical History:  Procedure Laterality Date   COLONOSCOPY     HERNIA REPAIR  2021   I & D EXTREMITY Left 06/20/2013   Procedure: MINOR IRRIGATION AND DEBRIDEMENT LEFT  INDEX FINGER;  Surgeon: Tami Ribas, MD;  Location: [facility];  Service: Orthopedics;  Laterality: Left;   NASAL SINUS SURGERY     UPPER GASTROINTESTINAL ENDOSCOPY     WISDOM TOOTH EXTRACTION      Current Outpatient Medications  Medication Sig Dispense Refill   acetaminophen (TYLENOL) 500 MG tablet Take 1,000 mg by mouth every 8 (eight) hours as needed for moderate pain or headache.     albuterol (VENTOLIN HFA) 108 (90 Base) MCG/ACT inhaler Inhale 2 puffs into the lungs every 4 (four) hours as needed for wheezing or shortness of breath.      aspirin 81 MG EC tablet Take 162 mg by mouth daily.      azelastine (ASTELIN) 0.1 % nasal spray Place 1 spray into both nostrils 2 (two) times daily as needed for allergies.      Calcium 250 MG CAPS Take 250 mg by mouth daily.     cetirizine (ZYRTEC) 10 MG tablet Take 10 mg by mouth daily as needed for allergies.      Cholecalciferol (DIALYVITE VITAMIN D 5000) 125 MCG (5000 UT) capsule Take 5,000 Units by mouth daily.     Coenzyme Q10 (COQ10) 200 MG CAPS Take 200 mg by mouth daily.     cyclobenzaprine (FLEXERIL) 10 MG tablet Take 1 tablet by mouth every 8 (eight) hours as needed.     diphenhydrAMINE (BENADRYL ALLERGY) 25 MG tablet Take 1 tablet (25 mg total) by mouth once for 1 dose. Benadryl 1 hour prior to scan 1 tablet 0  glipiZIDE (GLUCOTROL XL) 5 MG 24 hr tablet Take 5 mg by mouth daily with breakfast.     guaiFENesin (MUCINEX) 600 MG 12 hr tablet Take 600 mg by mouth every 12 (twelve) hours as needed (congestion).      ibuprofen (ADVIL) 800 MG tablet Take 1 tablet (800 mg total) by mouth 3 (three) times daily. 30 tablet 0   lisinopril (ZESTRIL) 20 MG tablet Take 20 mg by mouth daily.      lovastatin (MEVACOR) 20 MG tablet Take 20 mg by mouth at bedtime.     magnesium oxide (MAG-OX) 400 MG tablet Take 400 mg by mouth every Monday, Wednesday, and Friday.     metFORMIN (GLUCOPHAGE-XR) 500 MG 24 hr tablet Take 500 mg by mouth 2 (two)  times daily.     montelukast (SINGULAIR) 10 MG tablet Take 10 mg by mouth at bedtime.      Multiple Vitamin (MULTIVITAMIN) tablet Take 1 tablet by mouth daily.     Potassium 99 MG TABS Take 99 mg by mouth daily.     Probiotic Product (PROBIOTIC ADVANCED PO) Take 1 tablet by mouth daily.     pseudoephedrine (SUDAFED) 30 MG tablet Take 30 mg by mouth every 4 (four) hours as needed for congestion.      silodosin (RAPAFLO) 8 MG CAPS capsule Take 1 capsule (8 mg total) by mouth at bedtime. 30 capsule 11   tadalafil (CIALIS) 20 MG tablet Take 1 tablet (20 mg total) by mouth daily as needed for erectile dysfunction. 10 tablet 6   testosterone cypionate (DEPOTESTOSTERONE CYPIONATE) 200 MG/ML injection Inject 0.6 mLs (120 mg total) into the muscle every 7 (seven) days. 1 mL 5   Vibegron (GEMTESA) 75 MG TABS Take 1 capsule by mouth daily. 90 tablet 3   vitamin B-12 (CYANOCOBALAMIN) 1000 MCG tablet Take 1,000 mcg by mouth daily.     predniSONE (DELTASONE) 10 MG tablet Take 1 tablet (10 mg total) by mouth 3 (three) times daily. (Patient not taking: Reported on 02/18/2023) 15 tablet 0   No current facility-administered medications for this visit.     Allergies:   Amlodipine, Ciprofloxacin, Contrast media [iodinated contrast media], Erythromycin, Ioxaglate, Metrizamide, Codeine, Hydrocodone-acetaminophen, and Metformin and related   Social History:  The patient  reports that he has never smoked. He has never used smokeless tobacco. He reports current alcohol use. He reports that he does not use drugs.   Family History:   family history includes Colon cancer in his father; Heart disease in his father.    Review of Systems: Review of Systems  Constitutional: Negative.   HENT: Negative.    Respiratory: Negative.    Cardiovascular: Negative.   Gastrointestinal: Negative.   Musculoskeletal: Negative.   Neurological: Negative.   Psychiatric/Behavioral: Negative.    All other systems reviewed and are  negative.    PHYSICAL EXAM: VS:  BP 120/88 (BP Location: Right Arm, Patient Position: Sitting, Cuff Size: Normal)   Pulse 81   Ht 6\' 3"  (1.905 m)   Wt 226 lb (102.5 kg)   SpO2 96%   BMI 28.25 kg/m  , BMI Body mass index is 28.25 kg/m. GEN: Well nourished, well developed, in no acute distress HEENT: normal Neck: no JVD, carotid bruits, or masses Cardiac: RRR; no murmurs, rubs, or gallops,no edema  Respiratory:  clear to auscultation bilaterally, normal work of breathing GI: soft, nontender, nondistended, + BS MS: no deformity or atrophy Skin: warm and dry, no rash Neuro:  Strength and  sensation are intact Psych: euthymic mood, full affect   Recent Labs: 07/23/2022: ALT 29; BUN 14; Creatinine, Ser 1.18; Hemoglobin 17.5; Potassium 4.4; Sodium 140    Lipid Panel No results found for: "CHOL", "HDL", "LDLCALC", "TRIG"    Wt Readings from Last 3 Encounters:  02/18/23 226 lb (102.5 kg)  06/08/22 229 lb 0.9 oz (103.9 kg)  01/30/22 229 lb (103.9 kg)       ASSESSMENT AND PLAN:  Problem List Items Addressed This Visit     Sinus tachycardia - Primary   Relevant Orders   EKG 12-Lead (Completed)   Elevated heart rate Heart rate in the 80s today, asymptomatic Prior stress test several years ago with no acute findings per his recollection Normal EKG today, normal clinical exam No strong indication to add beta-blockers to his regiment for rate control Reports he suffers from chronic fatigue, adding beta-blocker may make his symptoms worse No indication of arrhythmia and need for Zio monitor  Fatigue Reports poor sleep hygiene, sleeps for 4 hours at a time, frequent waking Has sleep apnea  Essential hypertension Blood pressure is well controlled on today's visit. No changes made to the medications.  Hyperlipidemia Numbers well-controlled on statin  Diabetes type 2 Mild improvement in A1c with dietary changes Managed by primary care    Total encounter time more  than 60 minutes  Greater than 50% was spent in counseling and coordination of care with the patient    Signed, Dossie Arbour, M.D., Ph.D. [facility] [place], [place] [phone]

## 2023-02-18 ENCOUNTER — Ambulatory Visit: Payer: BC Managed Care – PPO | Attending: Cardiovascular Disease | Admitting: Cardiovascular Disease

## 2023-02-18 ENCOUNTER — Encounter: Payer: Self-pay | Admitting: Cardiovascular Disease

## 2023-02-18 VITALS — BP 120/88 | HR 81 | Ht 75.0 in | Wt 226.0 lb

## 2023-02-18 DIAGNOSIS — R5383 Other fatigue: Secondary | ICD-10-CM

## 2023-02-18 DIAGNOSIS — E782 Mixed hyperlipidemia: Secondary | ICD-10-CM | POA: Diagnosis not present

## 2023-02-18 DIAGNOSIS — R000 Tachycardia, unspecified: Secondary | ICD-10-CM | POA: Diagnosis not present

## 2023-02-18 NOTE — Patient Instructions (Signed)
Medication Instructions:  No changes  If you need a refill on your cardiac medications before your next appointment, please call your pharmacy.   Lab work: No new labs needed  Testing/Procedures: No new testing needed  Follow-Up: At [facility], you and your health needs are our priority.  As part of our continuing mission to provide you with exceptional heart care, we have created designated Provider Care Teams.  These Care Teams include your primary Cardiologist (physician) and Advanced Practice Providers (APPs -  Physician Assistants and Nurse Practitioners) who all work together to provide you with the care you need, when you need it.  You will need a follow up appointment as needed  Providers on your designated Care Team:   Christopher Berge, NP Ryan Dunn, PA-C Cadence Furth, PA-C  COVID-19 Vaccine Information can be found at: [email or web address] For questions related to vaccine distribution or appointments, please email [email or web address] or call [phone].    

## 2023-03-11 ENCOUNTER — Other Ambulatory Visit: Payer: Self-pay | Admitting: Nurse Practitioner

## 2023-03-11 ENCOUNTER — Ambulatory Visit: Payer: Self-pay

## 2023-03-11 DIAGNOSIS — S39012A Strain of muscle, fascia and tendon of lower back, initial encounter: Secondary | ICD-10-CM

## 2023-04-09 ENCOUNTER — Other Ambulatory Visit: Payer: Self-pay | Admitting: Orthopedic Surgery

## 2023-04-09 DIAGNOSIS — M545 Low back pain, unspecified: Secondary | ICD-10-CM

## 2023-04-24 ENCOUNTER — Ambulatory Visit
Admission: RE | Admit: 2023-04-24 | Discharge: 2023-04-24 | Disposition: A | Discharge status: Home / Self Care | Payer: Worker's Compensation | Source: Ambulatory Visit | Attending: Orthopedic Surgery | Admitting: Orthopedic Surgery

## 2023-04-24 DIAGNOSIS — M545 Low back pain, unspecified: Secondary | ICD-10-CM

## 2023-05-25 ENCOUNTER — Telehealth: Payer: Self-pay

## 2023-05-25 NOTE — Telephone Encounter (Signed)
Patient called in to schedule his colonoscopy. I advised him that we will need referral from his PCP to schedule his appointment. The patient state that his doctor just sent me a text saying they just sent it. I informed him that we have not received it yet. When the referral come in we will call him to schedule his colonoscopy but if he don't hear anything from [place] by Thursday then call us.

## 2023-05-26 ENCOUNTER — Other Ambulatory Visit: Payer: Self-pay | Admitting: *Deleted

## 2023-05-26 ENCOUNTER — Telehealth: Payer: Self-pay | Admitting: *Deleted

## 2023-05-26 DIAGNOSIS — Z1211 Encounter for screening for malignant neoplasm of colon: Secondary | ICD-10-CM

## 2023-05-26 DIAGNOSIS — Z83719 Family history of colon polyps, unspecified: Secondary | ICD-10-CM

## 2023-05-26 DIAGNOSIS — Z860100 Personal history of colon polyps, unspecified: Secondary | ICD-10-CM

## 2023-05-26 MED ORDER — NA SULFATE-K SULFATE-MG SULF 17.5-3.13-1.6 GM/177ML PO SOLN
1.0000 | Freq: Once | ORAL | 0 refills | Status: AC
Start: 2023-05-26 — End: 2023-05-26

## 2023-05-26 MED ORDER — NA SULFATE-K SULFATE-MG SULF 17.5-3.13-1.6 GM/177ML PO SOLN: 1.0000 | Freq: Once | ORAL | 0 refills | Status: DC

## 2023-05-26 NOTE — Telephone Encounter (Signed)
Gastroenterology Pre-Procedure Review  Request Date: 07/09/2023 Requesting Physician: Dr. Allegra Lai  PATIENT REVIEW QUESTIONS: The patient responded to the following health history questions as indicated:    1. Are you having any GI issues? no 2. Do you have a personal history of Polyps?  He believe so, due to family history , patient have been doing repeat every 4-5 years 3. Do you have a family history of Colon Cancer or Polyps? yes (father had colon cancer, siblings had colon polyps) 4. Diabetes Mellitus? yes (taking glipizide) 5. Joint replacements in the past 12 months?no 6. Major health problems in the past 3 months?no 7. Any artificial heart valves, MVP, or defibrillator?no    MEDICATIONS & ALLERGIES:    Patient reports the following regarding taking any anticoagulation/antiplatelet therapy:   Plavix, Coumadin, Eliquis, Xarelto, Lovenox, Pradaxa, Brilinta, or Effient? no Aspirin? yes (162 mg)  Patient confirms/reports the following medications:  Current Outpatient Medications  Medication Sig Dispense Refill   acetaminophen (TYLENOL) 500 MG tablet Take 1,000 mg by mouth every 8 (eight) hours as needed for moderate pain or headache.     albuterol (VENTOLIN HFA) 108 (90 Base) MCG/ACT inhaler Inhale 2 puffs into the lungs every 4 (four) hours as needed for wheezing or shortness of breath.      aspirin 81 MG EC tablet Take 162 mg by mouth daily.      azelastine (ASTELIN) 0.1 % nasal spray Place 1 spray into both nostrils 2 (two) times daily as needed for allergies.      Calcium 250 MG CAPS Take 250 mg by mouth daily.     cetirizine (ZYRTEC) 10 MG tablet Take 10 mg by mouth daily as needed for allergies.      Cholecalciferol (DIALYVITE VITAMIN D 5000) 125 MCG (5000 UT) capsule Take 5,000 Units by mouth daily.     Coenzyme Q10 (COQ10) 200 MG CAPS Take 200 mg by mouth daily.     cyclobenzaprine (FLEXERIL) 10 MG tablet Take 1 tablet by mouth every 8 (eight) hours as needed.      diphenhydrAMINE (BENADRYL ALLERGY) 25 MG tablet Take 1 tablet (25 mg total) by mouth once for 1 dose. Benadryl 1 hour prior to scan 1 tablet 0   glipiZIDE (GLUCOTROL XL) 5 MG 24 hr tablet Take 5 mg by mouth daily with breakfast.     guaiFENesin (MUCINEX) 600 MG 12 hr tablet Take 600 mg by mouth every 12 (twelve) hours as needed (congestion).      ibuprofen (ADVIL) 800 MG tablet Take 1 tablet (800 mg total) by mouth 3 (three) times daily. 30 tablet 0   lisinopril (ZESTRIL) 20 MG tablet Take 20 mg by mouth daily.      lovastatin (MEVACOR) 20 MG tablet Take 20 mg by mouth at bedtime.     magnesium oxide (MAG-OX) 400 MG tablet Take 400 mg by mouth every Monday, Wednesday, and Friday.     metFORMIN (GLUCOPHAGE-XR) 500 MG 24 hr tablet Take 500 mg by mouth 2 (two) times daily.     montelukast (SINGULAIR) 10 MG tablet Take 10 mg by mouth at bedtime.      Multiple Vitamin (MULTIVITAMIN) tablet Take 1 tablet by mouth daily.     Potassium 99 MG TABS Take 99 mg by mouth daily.     Probiotic Product (PROBIOTIC ADVANCED PO) Take 1 tablet by mouth daily.     pseudoephedrine (SUDAFED) 30 MG tablet Take 30 mg by mouth every 4 (four) hours as needed for congestion.  silodosin (RAPAFLO) 8 MG CAPS capsule Take 1 capsule (8 mg total) by mouth at bedtime. 30 capsule 11   tadalafil (CIALIS) 20 MG tablet Take 1 tablet (20 mg total) by mouth daily as needed for erectile dysfunction. 10 tablet 6   testosterone cypionate (DEPOTESTOSTERONE CYPIONATE) 200 MG/ML injection Inject 0.6 mLs (120 mg total) into the muscle every 7 (seven) days. 1 mL 5   Vibegron (GEMTESA) 75 MG TABS Take 1 capsule by mouth daily. 90 tablet 3   vitamin B-12 (CYANOCOBALAMIN) 1000 MCG tablet Take 1,000 mcg by mouth daily.     No current facility-administered medications for this visit.    Patient confirms/reports the following allergies:  Allergies  Allergen Reactions   Amlodipine Other (See Comments)    edema, edema   Ciprofloxacin Itching    Contrast Media [Iodinated Contrast Media] Itching    Gets hot   Erythromycin     Stomach pain   Ioxaglate Itching   Metrizamide Itching   Codeine Itching   Hydrocodone-Acetaminophen Itching   Metformin And Related Diarrhea    The plain metformin caused severe diarrhea but the ER version doesnt    No orders of the defined types were placed in this encounter.   AUTHORIZATION INFORMATION Primary Insurance: 1D#: Group #:  Secondary Insurance: 1D#: Group #:  SCHEDULE INFORMATION: Date: 07/09/2023 Time: Location: [facility]

## 2023-05-26 NOTE — Telephone Encounter (Signed)
I called patient to inform him that we have received his referral and I also verified and update the information in her chart, including the insurance guarantor and other relevant details

## 2023-05-26 NOTE — Telephone Encounter (Signed)
Encounter message from [facility]  1. Your procedure is scheduled for Thursday July 14, 2018  2. Your procedure is scheduled with        Dr. Lynnae Prude  3. Your procedure will be performed at:       [facility]

## 2023-05-26 NOTE — Telephone Encounter (Signed)
Colonoscopy schedule with Dr Allegra Lai at [facility] 07/09/2023

## 2023-05-26 NOTE — Addendum Note (Signed)
Addended by: Tawnya Crook on: 05/26/2023 02:17 PM   Modules accepted: Orders

## 2023-07-09 ENCOUNTER — Encounter
Admission: RE | Disposition: A | Discharge status: Home / Self Care | Payer: Self-pay | Source: Home / Self Care | Attending: Gastroenterology

## 2023-07-09 ENCOUNTER — Ambulatory Visit: Payer: BC Managed Care – PPO | Admitting: Anesthesiology

## 2023-07-09 ENCOUNTER — Ambulatory Visit
Admission: RE | Admit: 2023-07-09 | Discharge: 2023-07-09 | Disposition: A | Discharge status: Home / Self Care | Payer: BC Managed Care – PPO | Attending: Gastroenterology | Admitting: Gastroenterology

## 2023-07-09 DIAGNOSIS — Z1211 Encounter for screening for malignant neoplasm of colon: Secondary | ICD-10-CM | POA: Diagnosis not present

## 2023-07-09 DIAGNOSIS — I10 Essential (primary) hypertension: Secondary | ICD-10-CM | POA: Insufficient documentation

## 2023-07-09 DIAGNOSIS — Z800 Family history of malignant neoplasm of digestive organs: Secondary | ICD-10-CM | POA: Diagnosis not present

## 2023-07-09 DIAGNOSIS — K621 Rectal polyp: Secondary | ICD-10-CM | POA: Diagnosis not present

## 2023-07-09 DIAGNOSIS — Z7984 Long term (current) use of oral hypoglycemic drugs: Secondary | ICD-10-CM | POA: Diagnosis not present

## 2023-07-09 DIAGNOSIS — E119 Type 2 diabetes mellitus without complications: Secondary | ICD-10-CM | POA: Insufficient documentation

## 2023-07-09 DIAGNOSIS — Z83719 Family history of colon polyps, unspecified: Secondary | ICD-10-CM

## 2023-07-09 HISTORY — PX: BIOPSY: SHX5522

## 2023-07-09 HISTORY — PX: COLONOSCOPY WITH PROPOFOL: SHX5780

## 2023-07-09 LAB — GLUCOSE, CAPILLARY: Glucose-Capillary: 141 mg/dL — ABNORMAL HIGH (ref 70–99)

## 2023-07-09 SURGERY — COLONOSCOPY WITH PROPOFOL
Anesthesia: General

## 2023-07-09 MED ORDER — LIDOCAINE HCL (CARDIAC) PF 100 MG/5ML IV SOSY
PREFILLED_SYRINGE | INTRAVENOUS | Status: DC | PRN
  Administered 2023-07-09: 40 mg via INTRAVENOUS

## 2023-07-09 MED ORDER — PROPOFOL 10 MG/ML IV BOLUS
INTRAVENOUS | Status: AC
  Filled 2023-07-09: qty 20

## 2023-07-09 MED ORDER — LIDOCAINE HCL (PF) 2 % IJ SOLN
INTRAMUSCULAR | Status: AC
Start: 2023-07-09 — End: ?
  Filled 2023-07-09: qty 5

## 2023-07-09 MED ORDER — SODIUM CHLORIDE 0.9 % IV SOLN
INTRAVENOUS | Status: DC
  Administered 2023-07-09: 20 mL/h via INTRAVENOUS

## 2023-07-09 MED ORDER — PROPOFOL 10 MG/ML IV BOLUS
INTRAVENOUS | Status: DC | PRN
  Administered 2023-07-09: 60 mg via INTRAVENOUS

## 2023-07-09 MED ORDER — PROPOFOL 1000 MG/100ML IV EMUL
INTRAVENOUS | Status: AC
  Filled 2023-07-09: qty 100

## 2023-07-09 MED ORDER — PROPOFOL 500 MG/50ML IV EMUL
INTRAVENOUS | Status: DC | PRN
  Administered 2023-07-09: 100 ug/kg/min via INTRAVENOUS

## 2023-07-09 NOTE — H&P (Signed)
Arlyss Repress, MD [address]  [address]  [place], [place] [address]  Main: [phone]  Fax: [phone] Pager: [phone]  Primary Care Physician:  Marisue Ivan, MD Primary Gastroenterologist:  Dr. Arlyss Repress  Pre-Procedure History & Physical: HPI:  [name] is a 62 y.o. male is here for an colonoscopy.   Past Medical History:  Diagnosis Date   BPH (benign prostatic hyperplasia)    Diabetes mellitus without complication (HCC)    Fatty liver    GERD (gastroesophageal reflux disease)    no meds   History of hiatal hernia    History of polycythemia    Hyperlipidemia    Hypertension    Low testosterone    Medical history non-contributory    Polycythemia    Sleep apnea    uses a cpap nightly    Past Surgical History:  Procedure Laterality Date   COLONOSCOPY     HERNIA REPAIR  2021   I & D EXTREMITY Left 06/20/2013   Procedure: MINOR IRRIGATION AND DEBRIDEMENT LEFT INDEX FINGER;  Surgeon: Tami Ribas, MD;  Location: [facility];  Service: Orthopedics;  Laterality: Left;   NASAL SINUS SURGERY     UPPER GASTROINTESTINAL ENDOSCOPY     WISDOM TOOTH EXTRACTION      Prior to Admission medications   Medication Sig Start Date End Date Taking? Authorizing Provider  acetaminophen (TYLENOL) 500 MG tablet Take 1,000 mg by mouth every 8 (eight) hours as needed for moderate pain or headache.   Yes [provider]  aspirin 81 MG EC tablet Take 162 mg by mouth daily.    Yes [provider]  Calcium 250 MG CAPS Take 250 mg by mouth daily.   Yes [provider]  Cholecalciferol (DIALYVITE VITAMIN D 5000) 125 MCG (5000 UT) capsule Take 5,000 Units by mouth daily.   Yes [provider]  Coenzyme Q10 (COQ10) 200 MG CAPS Take 200 mg by mouth daily.   Yes [provider]  cyclobenzaprine (FLEXERIL) 10 MG tablet Take 1 tablet by mouth every 8 (eight) hours as needed. 09/19/20  Yes [provider]   glipiZIDE (GLUCOTROL XL) 5 MG 24 hr tablet Take 5 mg by mouth daily with breakfast. 08/25/22  Yes [provider]  guaiFENesin (MUCINEX) 600 MG 12 hr tablet Take 600 mg by mouth every 12 (twelve) hours as needed (congestion).    Yes [provider]  ibuprofen (ADVIL) 800 MG tablet Take 1 tablet (800 mg total) by mouth 3 (three) times daily. 03/15/22  Yes Ellsworth Lennox, PA-C  lisinopril (ZESTRIL) 20 MG tablet Take 20 mg by mouth daily.  06/26/19  Yes [provider]  lovastatin (MEVACOR) 20 MG tablet Take 20 mg by mouth at bedtime.   Yes [provider]  magnesium oxide (MAG-OX) 400 MG tablet Take 400 mg by mouth every Monday, Wednesday, and Friday.   Yes [provider]  metFORMIN (GLUCOPHAGE-XR) 500 MG 24 hr tablet Take 500 mg by mouth 2 (two) times daily.   Yes [provider]  montelukast (SINGULAIR) 10 MG tablet Take 10 mg by mouth at bedtime.  04/07/20  Yes [provider]  Multiple Vitamin (MULTIVITAMIN) tablet Take 1 tablet by mouth daily.   Yes [provider]  Potassium 99 MG TABS Take 99 mg by mouth daily.   Yes [provider]  Probiotic Product (PROBIOTIC ADVANCED PO) Take 1 tablet by mouth daily.   Yes [provider]  tadalafil (CIALIS) 20 MG tablet Take 1 tablet (20 mg total) by mouth daily as needed for erectile dysfunction. 01/30/22  Yes McKenzie, Mardene Celeste, MD  testosterone cypionate (DEPOTESTOSTERONE CYPIONATE) 200 MG/ML injection Inject 0.6 mLs (120 mg total) into the muscle every 7 (seven) days. 11/03/22  Yes McKenzie, Mardene Celeste, MD  Vibegron (GEMTESA) 75 MG TABS Take 1 capsule by mouth daily. 07/31/22  Yes McKenzie, Mardene Celeste, MD  vitamin B-12 (CYANOCOBALAMIN) 1000 MCG tablet Take 1,000 mcg by mouth daily.   Yes [provider]  albuterol (VENTOLIN HFA) 108 (90 Base) MCG/ACT inhaler Inhale 2 puffs into the lungs every 4 (four) hours as needed for wheezing or shortness of breath.  12/22/19  02/18/23  [provider]  azelastine (ASTELIN) 0.1 % nasal spray Place 1 spray into both nostrils 2 (two) times daily as needed for allergies.  12/25/19 02/18/23  [provider]  cetirizine (ZYRTEC) 10 MG tablet Take 10 mg by mouth daily as needed for allergies.     [provider]  diphenhydrAMINE (BENADRYL ALLERGY) 25 MG tablet Take 1 tablet (25 mg total) by mouth once for 1 dose. Benadryl 1 hour prior to scan 07/24/21 02/18/23  Malen Gauze, MD  pseudoephedrine (SUDAFED) 30 MG tablet Take 30 mg by mouth every 4 (four) hours as needed for congestion.     [provider]  silodosin (RAPAFLO) 8 MG CAPS capsule Take 1 capsule (8 mg total) by mouth at bedtime. 10/05/22   Malen Gauze, MD    Allergies as of 05/26/2023 - Review Complete 02/18/2023  Allergen Reaction Noted   Amlodipine Other (See Comments) 08/09/2015   Ciprofloxacin Itching 05/02/2020   Contrast media [iodinated contrast media] Itching 03/22/2012   Erythromycin  11/20/2012   Ioxaglate Itching 05/02/2020   Metrizamide Itching 05/02/2020   Codeine Itching 03/22/2012   Hydrocodone-acetaminophen Itching 08/25/2014   Metformin and related Diarrhea 05/02/2020    Family History  Problem Relation Age of Onset   Heart disease Father    Colon cancer Father     Social History   Socioeconomic History   Marital status: Married    Spouse name: Not on file   Number of children: Not on file   Years of education: Not on file   Highest education level: Not on file  Occupational History   Not on file  Tobacco Use   Smoking status: Never   Smokeless tobacco: Never  Vaping Use   Vaping status: Never Used  Substance and Sexual Activity   Alcohol use: Yes    Comment: occ   Drug use: No   Sexual activity: Not on file  Other Topics Concern   Not on file  Social History Narrative   Not on file   Social Determinants of Health   Financial Resource Strain: Low Risk  (05/10/2023)    Received from [facility]   Overall Financial Resource Strain (CARDIA)    Difficulty of Paying Living Expenses: Not hard at all  Food Insecurity: No Food Insecurity (05/10/2023)   Received from [facility]   Hunger Vital Sign    Worried About Running Out of Food in the Last Year: Never true    Ran Out of Food in the Last Year: Never true  Transportation Needs: No Transportation Needs (05/10/2023)   Received from [facility] - Transportation    In the past 12 months, has lack of transportation kept you from medical appointments  or from getting medications?: No    Lack of Transportation (Non-Medical): No  Physical Activity: Not on file  Stress: Not on file (06/30/2023)  Social Connections: Not on file  Intimate Partner Violence: Low Risk  (12/08/2019)   Received from [facility]   Intimate Partner Violence    Insults You: Not on file    Threatens You: Not on file    Screams at You: Not on file    Physically Hurt: Not on file    Intimate Partner Violence Score: Not on file    Review of Systems: See HPI, otherwise negative ROS  Physical Exam: BP 113/84   Pulse 99   Temp (!) 96.5 F (35.8 C) (Temporal)   Resp 20   Ht 6\' 3"  (1.905 m)   Wt 100.1 kg   SpO2 99%   BMI 27.57 kg/m  General:   Alert,  pleasant and cooperative in NAD Head:  Normocephalic and atraumatic. Neck:  Supple; no masses or thyromegaly. Lungs:  Clear throughout to auscultation.    Heart:  Regular rate and rhythm. Abdomen:  Soft, nontender and nondistended. Normal bowel sounds, without guarding, and without rebound.   Neurologic:  Alert and  oriented x4;  grossly normal neurologically.  Impression/Plan: [name] is here for an colonoscopy to be performed for colon cancer screening  Risks, benefits, limitations, and alternatives regarding  colonoscopy have been reviewed with the patient.  Questions have been answered.  All parties  agreeable.   Lannette Donath, MD  07/09/2023, 7:49 AM

## 2023-07-09 NOTE — Anesthesia Postprocedure Evaluation (Signed)
Anesthesia Post Note  Patient: [name]  Procedure(s) Performed: COLONOSCOPY WITH PROPOFOL BIOPSY  Patient location during evaluation: PACU Anesthesia Type: General Level of consciousness: awake Pain management: satisfactory to patient Vital Signs Assessment: post-procedure vital signs reviewed and stable Respiratory status: spontaneous breathing Cardiovascular status: blood pressure returned to baseline Anesthetic complications: no   No notable events documented.   Last Vitals:  Vitals:   07/09/23 0853 07/09/23 0901  BP: 96/65 92/80  Pulse: 90 85  Resp:    Temp:    SpO2: 97% 99%    Last Pain:  Vitals:   07/09/23 0901  TempSrc:   PainSc: 0-No pain                 VAN STAVEREN,Vimal Derego

## 2023-07-09 NOTE — Transfer of Care (Signed)
Immediate Anesthesia Transfer of Care Note  Patient: [name]  Procedure(s) Performed: Procedure(s): COLONOSCOPY WITH PROPOFOL (N/A) BIOPSY  Patient Location: PACU and Endoscopy Unit  Anesthesia Type:General  Level of Consciousness: sedated  Airway & Oxygen Therapy: Patient Spontanous Breathing and Patient connected to nasal cannula oxygen  Post-op Assessment: Report given to RN and Post -op Vital signs reviewed and stable  Post vital signs: Reviewed and stable  Last Vitals:  Vitals:   07/09/23 0851 07/09/23 0853  BP: (!) 86/65 96/65  Pulse: 93 90  Resp: 15   Temp:    SpO2: 95% 97%    Complications: No apparent anesthesia complications

## 2023-07-09 NOTE — Op Note (Signed)
[facility] Patient Name: [name] Procedure Date: 07/09/2023 8:25 AM MRN: [id number] Account #: [id number] Date of Birth: [birth year] Admit Type: Outpatient Age: 62 Room: [facility] ENDO ROOM 2 Gender: Male Note Status: Finalized Instrument Name: Prentice Docker [id number] Procedure:             Colonoscopy Indications:           Screening for colon cancer: Family history of                         colorectal cancer in multiple 2nd degree relatives,                         Screening in patient at increased risk: Family history                         of 1st-degree relative with colorectal cancer before                         age 65 years, Last colonoscopy: November 2014 Providers:             Toney Reil MD, MD Referring MD:          Marisue Ivan (Referring MD) Medicines:             General Anesthesia Complications:         No immediate complications. Estimated blood loss: None. Procedure:             Pre-Anesthesia Assessment:                        - Prior to the procedure, a History and Physical was                         performed, and patient medications and allergies were                         reviewed. The patient is competent. The risks and                         benefits of the procedure and the sedation options and                         risks were discussed with the patient. All questions                         were answered and informed consent was obtained.                         Patient identification and proposed procedure were                         verified by the physician, the nurse, the                         anesthesiologist, the anesthetist and the technician                         in the pre-procedure area in the procedure room in the  endoscopy suite. Mental Status Examination: alert and                         oriented. Airway Examination: normal oropharyngeal                          airway and neck mobility. Respiratory Examination:                         clear to auscultation. CV Examination: normal.                         Prophylactic Antibiotics: The patient does not require                         prophylactic antibiotics. Prior Anticoagulants: The                         patient has taken no anticoagulant or antiplatelet                         agents. ASA Grade Assessment: III - A patient with                         severe systemic disease. After reviewing the risks and                         benefits, the patient was deemed in satisfactory                         condition to undergo the procedure. The anesthesia                         plan was to use general anesthesia. Immediately prior                         to administration of medications, the patient was                         re-assessed for adequacy to receive sedatives. The                         heart rate, respiratory rate, oxygen saturations,                         blood pressure, adequacy of pulmonary ventilation, and                         response to care were monitored throughout the                         procedure. The physical status of the patient was                         re-assessed after the procedure.                        After obtaining informed consent, the colonoscope was  passed under direct vision. Throughout the procedure,                         the patient's blood pressure, pulse, and oxygen                         saturations were monitored continuously. The                         Colonoscope was introduced through the anus and                         advanced to the the cecum, identified by appendiceal                         orifice and ileocecal valve. The colonoscopy was                         performed with moderate difficulty due to inadequate                         bowel prep. The patient tolerated the procedure well.                          The quality of the bowel preparation was inadequate.                         The ileocecal valve, appendiceal orifice, and rectum                         were photographed. Findings:      The perianal and digital rectal examinations were normal. Pertinent       negatives include normal sphincter tone and no palpable rectal lesions.      A diminutive polyp was found in the rectum. The polyp was sessile. The       polyp was removed with a jumbo cold forceps. Resection and retrieval       were complete. Estimated blood loss: none.      The retroflexed view of the distal rectum and anal verge was normal and       showed no anal or rectal abnormalities.      Copious quantities of semi-liquid stool was found in the entire colon,       precluding visualization. Impression:            - Preparation of the colon was inadequate.                        - One diminutive polyp in the rectum, removed with a                         jumbo cold forceps. Resected and retrieved.                        - The distal rectum and anal verge are normal on                         retroflexion view.                        -  Stool in the entire examined colon. Recommendation:        - Discharge patient to home (with escort).                        - Resume previous diet today.                        - Continue present medications.                        - Await pathology results.                        - Repeat colonoscopy in 6 months with 2 day prep                         because the bowel preparation was suboptimal. 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 All rights reserved. The codes documented in this report are preliminary and upon coder review may  be revised to meet current compliance requirements. Dr. Libby Maw Toney Reil MD, MD 07/09/2023 8:48:41 AM This report has been signed electronically. Number of Addenda: 0 Note Initiated On: 07/09/2023 8:25 AM Scope Withdrawal Time: 0 hours 4 minutes 21 seconds  Total Procedure Duration: 0 hours 10 minutes 18 seconds  Estimated Blood Loss:  Estimated blood loss: none.      [facility]

## 2023-07-09 NOTE — Anesthesia Preprocedure Evaluation (Addendum)
Anesthesia Evaluation  Patient identified by MRN, date of birth, ID band Patient awake    Reviewed: Allergy & Precautions, NPO status , Patient's Chart, lab work & pertinent test results  Airway Mallampati: III  TM Distance: <3 FB Neck ROM: Full    Dental  (+) Teeth Intact   Pulmonary neg pulmonary ROS, sleep apnea and Continuous Positive Airway Pressure Ventilation    Pulmonary exam normal breath sounds clear to auscultation       Cardiovascular Exercise Tolerance: Good hypertension, Pt. on medications Normal cardiovascular exam Rhythm:Regular Rate:Normal     Neuro/Psych negative neurological ROS  negative psych ROS   GI/Hepatic negative GI ROS, Neg liver ROS, hiatal hernia,GERD  ,,  Endo/Other  diabetes, Well Controlled, Type 2, Oral Hypoglycemic Agents    Renal/GU negative Renal ROS  negative genitourinary   Musculoskeletal   Abdominal Normal abdominal exam  (+)   Peds negative pediatric ROS (+)  Hematology negative hematology ROS (+)   Anesthesia Other Findings Past Medical History: No date: BPH (benign prostatic hyperplasia) No date: Diabetes mellitus without complication (HCC) No date: Fatty liver No date: GERD (gastroesophageal reflux disease)     Comment:  no meds No date: History of hiatal hernia No date: History of polycythemia No date: Hyperlipidemia No date: Hypertension No date: Low testosterone No date: Medical history non-contributory No date: Polycythemia No date: Sleep apnea     Comment:  uses a cpap nightly  Past Surgical History: No date: COLONOSCOPY 2021: HERNIA REPAIR 06/20/2013: I & D EXTREMITY; Left     Comment:  Procedure: MINOR IRRIGATION AND DEBRIDEMENT LEFT INDEX               FINGER;  Surgeon: Tami Ribas, MD;  Location: [facility];  Service: Orthopedics;  Laterality:               Left; No date: NASAL SINUS SURGERY No date: UPPER  GASTROINTESTINAL ENDOSCOPY No date: WISDOM TOOTH EXTRACTION  BMI    Body Mass Index: 27.57 kg/m      Reproductive/Obstetrics negative OB ROS                              Anesthesia Physical Anesthesia Plan  ASA: 3  Anesthesia Plan: General   Post-op Pain Management:    Induction: Intravenous  PONV Risk Score and Plan: Propofol infusion and TIVA  Airway Management Planned: Natural Airway and Nasal Cannula  Additional Equipment:   Intra-op Plan:   Post-operative Plan:   Informed Consent: I have reviewed the patients History and Physical, chart, labs and discussed the procedure including the risks, benefits and alternatives for the proposed anesthesia with the patient or authorized representative who has indicated his/her understanding and acceptance.     Dental Advisory Given  Plan Discussed with: CRNA and Surgeon  Anesthesia Plan Comments:         Anesthesia Quick Evaluation

## 2023-07-12 ENCOUNTER — Encounter: Payer: Self-pay | Admitting: Gastroenterology

## 2023-07-12 LAB — SURGICAL PATHOLOGY

## 2023-07-13 ENCOUNTER — Telehealth: Payer: Self-pay

## 2023-07-13 NOTE — Telephone Encounter (Signed)
-----   Message from [facility] sent at 07/13/2023  1:55 PM EST ----- Recommend colonoscopy within 6 months with 2-day prep  RV

## 2023-07-13 NOTE — Telephone Encounter (Signed)
Put a reminder for 6 months

## 2023-11-03 ENCOUNTER — Other Ambulatory Visit: Payer: Self-pay | Admitting: Physician Assistant

## 2023-11-03 DIAGNOSIS — R42 Dizziness and giddiness: Secondary | ICD-10-CM

## 2023-11-03 DIAGNOSIS — Z87898 Personal history of other specified conditions: Secondary | ICD-10-CM

## 2023-11-12 ENCOUNTER — Ambulatory Visit
Admission: RE | Admit: 2023-11-12 | Discharge: 2023-11-12 | Disposition: A | Discharge status: Home / Self Care | Source: Ambulatory Visit | Attending: Physician Assistant | Admitting: Physician Assistant

## 2023-11-12 DIAGNOSIS — Z87898 Personal history of other specified conditions: Secondary | ICD-10-CM | POA: Diagnosis present

## 2023-11-12 DIAGNOSIS — R42 Dizziness and giddiness: Secondary | ICD-10-CM | POA: Diagnosis present

## 2024-05-11 ENCOUNTER — Emergency Department (HOSPITAL_BASED_OUTPATIENT_CLINIC_OR_DEPARTMENT_OTHER)
Admission: EM | Admit: 2024-05-11 | Discharge: 2024-05-11 | Disposition: A | Discharge status: Home / Self Care | Payer: Worker's Compensation | Source: Ambulatory Visit

## 2024-05-11 ENCOUNTER — Other Ambulatory Visit: Payer: Self-pay

## 2024-05-11 ENCOUNTER — Emergency Department (HOSPITAL_BASED_OUTPATIENT_CLINIC_OR_DEPARTMENT_OTHER): Admitting: Radiology

## 2024-05-11 DIAGNOSIS — R0781 Pleurodynia: Secondary | ICD-10-CM | POA: Diagnosis not present

## 2024-05-11 DIAGNOSIS — Z7982 Long term (current) use of aspirin: Secondary | ICD-10-CM | POA: Diagnosis not present

## 2024-05-11 DIAGNOSIS — W01198A Fall on same level from slipping, tripping and stumbling with subsequent striking against other object, initial encounter: Secondary | ICD-10-CM | POA: Insufficient documentation

## 2024-05-11 DIAGNOSIS — Y990 Civilian activity done for income or pay: Secondary | ICD-10-CM | POA: Insufficient documentation

## 2024-05-11 DIAGNOSIS — M25511 Pain in right shoulder: Secondary | ICD-10-CM | POA: Insufficient documentation

## 2024-05-11 DIAGNOSIS — W19XXXA Unspecified fall, initial encounter: Secondary | ICD-10-CM

## 2024-05-11 MED ORDER — KETOROLAC TROMETHAMINE 15 MG/ML IJ SOLN
15.0000 mg | Freq: Once | INTRAMUSCULAR | Status: AC
  Administered 2024-05-11: 15 mg via INTRAMUSCULAR
  Filled 2024-05-11: qty 1

## 2024-05-11 MED ORDER — METHYLPREDNISOLONE SODIUM SUCC 125 MG IJ SOLR
125.0000 mg | Freq: Once | INTRAMUSCULAR | Status: AC
  Administered 2024-05-11: 125 mg via INTRAMUSCULAR
  Filled 2024-05-11: qty 2

## 2024-05-11 NOTE — Discharge Instructions (Addendum)
 You need to follow-up with orthopedic surgery next week.  You have a strain and contusion of your right shoulder and may have a ligamentous injury as well.  You can alternate Tylenol  and Motrin  at home as needed for pain and use ice as needed.

## 2024-05-11 NOTE — ED Provider Notes (Signed)
 [name] EMERGENCY DEPARTMENT AT [facility] Provider Note   CSN: [id number] Arrival date & time: 05/11/24  1711     Patient presents with: [name] Randy is a 63 y.o. male.   63 year old male presents for evaluation of right shoulder pain after a fall.  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  Did not hit his head or lose consciousness.  He is complaining of right shoulder pain and right sided rib pain.  He is not on any blood thinners.  Denies any other symptoms or concerns at this time.   Fall Pertinent negatives include no chest pain, no abdominal pain and no shortness of breath.       Prior to Admission medications   Medication Sig Start Date End Date Taking? Authorizing Provider  acetaminophen  (TYLENOL ) 500 MG tablet Take 1,000 mg by mouth every 8 (eight) hours as needed for moderate pain or headache.    [provider]  albuterol (VENTOLIN HFA) 108 (90 Base) MCG/ACT inhaler Inhale 2 puffs into the lungs every 4 (four) hours as needed for wheezing or shortness of breath.  12/22/19 02/18/23  [provider]  aspirin 81 MG EC tablet Take 162 mg by mouth daily.     [provider]  azelastine (ASTELIN) 0.1 % nasal spray Place 1 spray into both nostrils 2 (two) times daily as needed for allergies.  12/25/19 02/18/23  [provider]  Calcium 250 MG CAPS Take 250 mg by mouth daily.    [provider]  cetirizine (ZYRTEC) 10 MG tablet Take 10 mg by mouth daily as needed for allergies.     [provider]  Cholecalciferol  (DIALYVITE VITAMIN D  5000) 125 MCG (5000 UT) capsule Take 5,000 Units by mouth daily.    [provider]  Coenzyme Q10 (COQ10) 200 MG CAPS Take 200 mg by mouth daily.    [provider]  cyclobenzaprine  (FLEXERIL ) 10 MG tablet Take 1 tablet by mouth every 8 (eight) hours as needed. 09/19/20   [provider]  diphenhydrAMINE  (BENADRYL  ALLERGY) 25 MG tablet Take 1 tablet (25 mg total) by mouth once for 1 dose. Benadryl  1 hour prior to scan 07/24/21 02/18/23  Randy Belvie CROME, MD  glipiZIDE (GLUCOTROL XL) 5 MG 24 hr tablet Take 5 mg by mouth daily with breakfast. 08/25/22   [provider]  guaiFENesin (MUCINEX) 600 MG 12 hr tablet Take 600 mg by mouth every 12 (twelve) hours as needed (congestion).     [provider]  ibuprofen  (ADVIL ) 800 MG tablet Take 1 tablet (800 mg total) by mouth 3 (three) times daily. 03/15/22   Randy Lenis, PA-C  lisinopril (ZESTRIL) 20 MG tablet Take 20 mg by mouth daily.  06/26/19   [provider]  lovastatin (MEVACOR) 20 MG tablet Take 20 mg by mouth at bedtime.    [provider]  magnesium  oxide (MAG-OX) 400 MG tablet Take 400 mg by mouth every Monday, Wednesday, and Friday.    [provider]  metFORMIN (GLUCOPHAGE-XR) 500 MG 24 hr tablet Take 500 mg by mouth 2 (two) times daily.    [provider]  montelukast  (SINGULAIR ) 10 MG tablet Take 10 mg by mouth at bedtime.  04/07/20   [provider]  Multiple Vitamin (MULTIVITAMIN) tablet Take 1 tablet by mouth daily.    [provider]  Potassium 99  MG TABS Take 99 mg by mouth daily.    [provider]  Probiotic Product (PROBIOTIC ADVANCED PO) Take 1 tablet by mouth daily.    [provider]  pseudoephedrine (SUDAFED) 30 MG tablet Take 30 mg by mouth every 4 (four) hours as needed for congestion.     [provider]  silodosin  (RAPAFLO ) 8 MG CAPS capsule Take 1 capsule (8 mg total) by mouth at bedtime. 10/05/22   Randy, Belvie CROME, MD  tadalafil  (CIALIS ) 20 MG tablet Take 1 tablet (20 mg total) by mouth daily as needed for erectile dysfunction. 01/30/22   Randy, Belvie CROME, MD  testosterone  cypionate (DEPOTESTOSTERONE CYPIONATE) 200 MG/ML injection Inject 0.6 mLs (120 mg total) into the muscle every 7 (seven) days.  11/03/22   Randy, Belvie CROME, MD  Vibegron  (GEMTESA ) 75 MG TABS Take 1 capsule by mouth daily. 07/31/22   Randy, Belvie CROME, MD  vitamin B-12 (CYANOCOBALAMIN ) 1000 MCG tablet Take 1,000 mcg by mouth daily.    [provider]    Allergies: Amlodipine, Ciprofloxacin , Contrast media [iodinated contrast media], Erythromycin, Ioxaglate, Metrizamide, Codeine, Hydrocodone -acetaminophen , and Metformin and related    Review of Systems  Constitutional:  Negative for chills and fever.  HENT:  Negative for ear pain and sore throat.   Eyes:  Negative for pain and visual disturbance.  Respiratory:  Negative for cough and shortness of breath.   Cardiovascular:  Negative for chest pain and palpitations.  Gastrointestinal:  Negative for abdominal pain and vomiting.  Genitourinary:  Negative for dysuria and hematuria.  Musculoskeletal:  Negative for arthralgias and back pain.       Right shoulder and rib pain   Skin:  Negative for color change and rash.  Neurological:  Negative for seizures and syncope.  All other systems reviewed and are negative.   Updated Vital Signs BP (!) 135/98   Pulse 95   Temp 97.9 F (36.6 C) (Temporal)   Resp 19   Ht 6' 3 (1.905 m)   Wt 101.2 kg   SpO2 97%   BMI 27.87 kg/m   Physical Exam Vitals and nursing note reviewed.  Constitutional:      General: He is not in acute distress.    Appearance: He is well-developed.  HENT:     Head: Normocephalic and atraumatic.  Eyes:     Conjunctiva/sclera: Conjunctivae normal.  Cardiovascular:     Rate and Rhythm: Normal rate and regular rhythm.     Heart sounds: No murmur heard. Pulmonary:     Effort: Pulmonary effort is normal. No respiratory distress.     Breath sounds: Normal breath sounds.  Abdominal:     Palpations: Abdomen is soft.     Tenderness: There is no abdominal tenderness.  Musculoskeletal:        General: Swelling and tenderness present.     Cervical back: Neck supple.     Comments: Right  shoulder is mildly swollen on the lateral aspect and tender to palpation, along the humerus, 2+ radial pulses, full range of motion of elbow and wrist  Skin:    General: Skin is warm and dry.     Capillary Refill: Capillary refill takes less than 2 seconds.  Neurological:     Mental Status: He is alert.  Psychiatric:        Mood and Affect: Mood normal.     (all labs ordered are listed, but only abnormal results are displayed) Labs Reviewed - No data to display  EKG:  None  Radiology: DG Shoulder Right Result Date: 05/11/2024 CLINICAL DATA:  [place].  Right shoulder pain. EXAM: RIGHT SHOULDER - 2+ VIEW COMPARISON:  None Available. FINDINGS: Minimal glenohumeral and AC joint degenerative changes. No fracture or dislocation. The clavicle, scapula and ribs are intact. IMPRESSION: Minimal degenerative changes but no acute bony findings. Electronically Signed   By: MYRTIS Stammer M.D.   On: 05/11/2024 18:15   DG Ribs Unilateral W/Chest Right Result Date: 05/11/2024 CLINICAL DATA:  Clem[facility].  Right shoulder and rib pain. EXAM: RIGHT RIBS AND CHEST - 3+ VIEW COMPARISON:  Chest x-ray 05/24/2021 FINDINGS: The cardiac silhouette, mediastinal and hilar contours are normal and stable. The lungs are clear. No pleural effusion or pneumothorax. Dedicated views of the right ribs do not demonstrate any definite acute right-sided rib fractures. No pleural thickening. IMPRESSION: 1. No acute cardiopulmonary findings. 2. No definite right-sided rib fractures. Electronically Signed   By: MYRTIS Stammer M.D.   On: 05/11/2024 18:14     Procedures   Medications Ordered in the ED  ketorolac  (TORADOL ) 15 MG/ML injection 15 mg (15 mg Intramuscular Given 05/11/24 1905)  methylPREDNISolone  sodium succinate (SOLU-MEDROL ) 125 mg/2 mL injection 125 mg (125 mg Intramuscular Given 05/11/24 1904)                                    Medical Decision Making Patient here for right shoulder pain and rib pain after a fall.  X-ray  imaging is negative.  Sling was taken off and he is having some difficulty moving his arm.  I gave him Toradol  steroids here and he is advised to Motrin  as needed for pain.  Advise close follow-up with primary care doctor and orthopedic surgery next week.  He feels comfortable plan be discharged home  Problems Addressed: Acute pain of right shoulder: acute illness or injury Fall, initial encounter: acute illness or injury Rib pain on right side: acute illness or injury  Amount and/or Complexity of Data Reviewed External Data Reviewed: notes.    Details: Patient records reviewed patient last seen in the primary care doctor office 2 days ago for routine physical Radiology: ordered and independent interpretation performed. Decision-making details documented in ED Course.    Details: Ordered and interpreted by me independently radiology Right shoulder x-ray: Shows no bony abnormality, fracture or dislocation Right chest and right x-ray: Shows no acute abnormality or fracture  Risk OTC drugs. Prescription drug management.     Final diagnoses:  Fall, initial encounter  Rib pain on right side  Acute pain of right shoulder    ED Discharge Orders     None          Gennaro Duwaine CROME, DO 05/11/24 2239

## 2024-05-11 NOTE — ED Notes (Signed)
 Reviewed AVS/discharge instruction with patient. Time allotted for and all questions answered. Patient is agreeable for d/c and escorted to ed exit by staff.

## 2024-05-11 NOTE — ED Triage Notes (Signed)
 Pt POV reporting R shoulder and rib pain after tripping and falling out of tractor trailer at work. Sling placed PTA. Did not hit head, no blood thinners.

## 2024-06-05 ENCOUNTER — Other Ambulatory Visit: Payer: Self-pay | Admitting: Physician Assistant

## 2024-06-05 DIAGNOSIS — M25511 Pain in right shoulder: Secondary | ICD-10-CM

## 2024-06-06 ENCOUNTER — Ambulatory Visit
Admission: RE | Admit: 2024-06-06 | Discharge: 2024-06-06 | Disposition: A | Discharge status: Home / Self Care | Payer: Worker's Compensation | Source: Ambulatory Visit | Attending: Physician Assistant | Admitting: Physician Assistant

## 2024-06-06 DIAGNOSIS — M25511 Pain in right shoulder: Secondary | ICD-10-CM

## 2024-06-09 ENCOUNTER — Ambulatory Visit
Admission: RE | Admit: 2024-06-09 | Discharge: 2024-06-09 | Disposition: A | Discharge status: Home / Self Care | Attending: Gastroenterology | Admitting: Gastroenterology

## 2024-06-09 ENCOUNTER — Ambulatory Visit: Admitting: Certified Registered"

## 2024-06-09 ENCOUNTER — Encounter
Admission: RE | Disposition: A | Discharge status: Home / Self Care | Payer: Self-pay | Source: Home / Self Care | Attending: Gastroenterology

## 2024-06-09 ENCOUNTER — Encounter: Payer: Self-pay | Admitting: Gastroenterology

## 2024-06-09 DIAGNOSIS — Z79899 Other long term (current) drug therapy: Secondary | ICD-10-CM | POA: Diagnosis not present

## 2024-06-09 DIAGNOSIS — Z1211 Encounter for screening for malignant neoplasm of colon: Secondary | ICD-10-CM | POA: Diagnosis present

## 2024-06-09 DIAGNOSIS — K219 Gastro-esophageal reflux disease without esophagitis: Secondary | ICD-10-CM | POA: Diagnosis not present

## 2024-06-09 DIAGNOSIS — Z7982 Long term (current) use of aspirin: Secondary | ICD-10-CM | POA: Insufficient documentation

## 2024-06-09 DIAGNOSIS — E119 Type 2 diabetes mellitus without complications: Secondary | ICD-10-CM | POA: Insufficient documentation

## 2024-06-09 DIAGNOSIS — Z800 Family history of malignant neoplasm of digestive organs: Secondary | ICD-10-CM | POA: Insufficient documentation

## 2024-06-09 DIAGNOSIS — Z7984 Long term (current) use of oral hypoglycemic drugs: Secondary | ICD-10-CM | POA: Insufficient documentation

## 2024-06-09 DIAGNOSIS — G4730 Sleep apnea, unspecified: Secondary | ICD-10-CM | POA: Diagnosis not present

## 2024-06-09 DIAGNOSIS — I10 Essential (primary) hypertension: Secondary | ICD-10-CM | POA: Insufficient documentation

## 2024-06-09 HISTORY — PX: COLONOSCOPY: SHX5424

## 2024-06-09 SURGERY — COLONOSCOPY
Anesthesia: General

## 2024-06-09 MED ORDER — LIDOCAINE HCL (CARDIAC) PF 100 MG/5ML IV SOSY
PREFILLED_SYRINGE | INTRAVENOUS | Status: DC | PRN
  Administered 2024-06-09: 50 mg via INTRAVENOUS

## 2024-06-09 MED ORDER — SODIUM CHLORIDE 0.9 % IV SOLN
INTRAVENOUS | Status: DC
  Administered 2024-06-09: 500 mL via INTRAVENOUS

## 2024-06-09 MED ORDER — PHENYLEPHRINE 80 MCG/ML (10ML) SYRINGE FOR IV PUSH (FOR BLOOD PRESSURE SUPPORT)
PREFILLED_SYRINGE | INTRAVENOUS | Status: DC | PRN
  Administered 2024-06-09: 120 ug via INTRAVENOUS

## 2024-06-09 MED ADMIN — PROPOFOL 200 MG/20ML IV EMUL: 50 mg | INTRAVENOUS | NDC 00069020910

## 2024-06-09 MED ADMIN — PROPOFOL 200 MG/20ML IV EMUL: 40 mg | INTRAVENOUS | NDC 00069020910

## 2024-06-09 MED ADMIN — PROPOFOL 200 MG/20ML IV EMUL: 120 mg | INTRAVENOUS | NDC 00069020910

## 2024-06-09 NOTE — Anesthesia Preprocedure Evaluation (Signed)
 Anesthesia Evaluation  Patient identified by MRN, date of birth, ID band Patient awake    Reviewed: Allergy & Precautions, NPO status , Patient's Chart, lab work & pertinent test results  Airway Mallampati: III  TM Distance: >3 FB Neck ROM: full    Dental  (+) Chipped   Pulmonary sleep apnea and Continuous Positive Airway Pressure Ventilation    Pulmonary exam normal        Cardiovascular hypertension, On Medications negative cardio ROS Normal cardiovascular exam     Neuro/Psych negative neurological ROS  negative psych ROS   GI/Hepatic Neg liver ROS,GERD  Medicated and Controlled,,  Endo/Other  negative endocrine ROSdiabetes    Renal/GU negative Renal ROS  negative genitourinary   Musculoskeletal   Abdominal   Peds  Hematology negative hematology ROS (+)   Anesthesia Other Findings Past Medical History: No date: BPH (benign prostatic hyperplasia) No date: Diabetes mellitus without complication (HCC) No date: Fatty liver No date: GERD (gastroesophageal reflux disease)     Comment:  no meds No date: History of hiatal hernia No date: History of polycythemia No date: Hyperlipidemia No date: Hypertension No date: Low testosterone  No date: Medical history non-contributory No date: Polycythemia No date: Sleep apnea     Comment:  uses a cpap nightly  Past Surgical History: 07/09/2023: BIOPSY     Comment:  Procedure: BIOPSY;  Surgeon: Unk Corinn Skiff, MD;                Location: [facility];  Service: Gastroenterology;; No date: COLONOSCOPY 07/09/2023: COLONOSCOPY WITH PROPOFOL ; N/A     Comment:  Procedure: COLONOSCOPY WITH PROPOFOL ;  Surgeon: Unk Corinn Skiff, MD;  Location: [facility];  Service:               Gastroenterology;  Laterality: N/A; 2021: HERNIA REPAIR 06/20/2013: I & D EXTREMITY; Left     Comment:  Procedure: MINOR IRRIGATION AND DEBRIDEMENT LEFT INDEX                FINGER;  Surgeon: Franky JONELLE Curia, MD;  Location: [facility];  Service: Orthopedics;  Laterality:               Left; No date: NASAL SINUS SURGERY No date: UPPER GASTROINTESTINAL ENDOSCOPY No date: WISDOM TOOTH EXTRACTION  BMI    Body Mass Index: 26.67 kg/m      Reproductive/Obstetrics negative OB ROS                              Anesthesia Physical Anesthesia Plan  ASA: 2  Anesthesia Plan: General   Post-op Pain Management: Minimal or no pain anticipated   Induction: Intravenous  PONV Risk Score and Plan: 2 and Propofol  infusion and TIVA  Airway Management Planned: Nasal Cannula  Additional Equipment: None  Intra-op Plan:   Post-operative Plan:   Informed Consent: I have reviewed the patients History and Physical, chart, labs and discussed the procedure including the risks, benefits and alternatives for the proposed anesthesia with the patient or authorized representative who has indicated his/her understanding and acceptance.     Dental advisory given  Plan Discussed with: CRNA and Surgeon  Anesthesia Plan Comments: (Discussed risks of anesthesia with patient, including possibility of difficulty with spontaneous ventilation under anesthesia necessitating airway  intervention, PONV, and rare risks such as cardiac or respiratory or neurological events, and allergic reactions. Discussed the role of CRNA in patient's perioperative care. Patient understands.)        Anesthesia Quick Evaluation

## 2024-06-09 NOTE — Anesthesia Postprocedure Evaluation (Signed)
 Anesthesia Post Note  Patient: [name]  Procedure(s) Performed: COLONOSCOPY  Patient location during evaluation: Endoscopy Anesthesia Type: General Level of consciousness: awake and alert Pain management: pain level controlled Vital Signs Assessment: post-procedure vital signs reviewed and stable Respiratory status: spontaneous breathing, nonlabored ventilation, respiratory function stable and patient connected to nasal cannula oxygen Cardiovascular status: blood pressure returned to baseline and stable Postop Assessment: no apparent nausea or vomiting Anesthetic complications: no   No notable events documented.   Last Vitals:  Vitals:   06/09/24 1025 06/09/24 1032  BP: 94/70 109/73  Pulse: 87 82  Resp: 16 14  Temp:  36.4 C  SpO2: 100% 100%    Last Pain:  Vitals:   06/09/24 1032  TempSrc:   PainSc: 0-No pain                 Debby Mines

## 2024-06-09 NOTE — Transfer of Care (Signed)
 Immediate Anesthesia Transfer of Care Note  Patient: [name]  Procedure(s) Performed: COLONOSCOPY  Patient Location: Endoscopy Unit  Anesthesia Type:General  Level of Consciousness: drowsy  Airway & Oxygen Therapy: Patient Spontanous Breathing  Post-op Assessment: Report given to RN, Post -op Vital signs reviewed and stable, and Patient moving all extremities  Post vital signs: Reviewed and stable  Last Vitals:  Vitals Value Taken Time  BP 92/64 06/09/24 10:16  Temp    Pulse 85 06/09/24 10:17  Resp 19 06/09/24 10:17  SpO2 96 % 06/09/24 10:17  Vitals shown include unfiled device data.  Last Pain:  Vitals:   06/09/24 0915  TempSrc: Temporal  PainSc: 0-No pain         Complications: No notable events documented.

## 2024-06-09 NOTE — Op Note (Signed)
 [facility] Gastroenterology Patient Name: [name] Procedure Date: 06/09/2024 9:31 AM MRN: [id number] Account #: [id number] Date of Birth: 1961/08/16 Admit Type: Outpatient Age: 63 Room: [facility] ENDO ROOM 4 Gender: Male Note Status: Finalized Instrument Name: Colon Scope [id number] Procedure:             Colonoscopy Indications:           Screening for colorectal malignant neoplasm,                         inadequate bowel prep on last colonoscopy (more recent                         than 10 years ago), Screening for colon cancer: Family                         history of colorectal cancer in multiple 2nd degree                         relatives, Screening in patient at increased risk:                         Family history of 1st-degree relative with colorectal                         cancer before age 34 years, Last colonoscopy: November                         2024 Providers:             Corinn Jess Brooklyn MD, MD Medicines:             General Anesthesia Complications:         No immediate complications. Estimated blood loss: None. Procedure:             Pre-Anesthesia Assessment:                        - Prior to the procedure, a History and Physical was                         performed, and patient medications and allergies were                         reviewed. The patient is competent. The risks and                         benefits of the procedure and the sedation options and                         risks were discussed with the patient. All questions                         were answered and informed consent was obtained.                         Patient identification and proposed procedure were                         verified  by the physician, the nurse, the                         anesthesiologist, the anesthetist and the technician                         in the pre-procedure area in the procedure room in the                         endoscopy suite.  Mental Status Examination: alert and                         oriented. Airway Examination: normal oropharyngeal                         airway and neck mobility. Respiratory Examination:                         clear to auscultation. CV Examination: normal.                         Prophylactic Antibiotics: The patient does not require                         prophylactic antibiotics. Prior Anticoagulants: The                         patient has taken no anticoagulant or antiplatelet                         agents. ASA Grade Assessment: II - A patient with mild                         systemic disease. After reviewing the risks and                         benefits, the patient was deemed in satisfactory                         condition to undergo the procedure. The anesthesia                         plan was to use general anesthesia. Immediately prior                         to administration of medications, the patient was                         re-assessed for adequacy to receive sedatives. The                         heart rate, respiratory rate, oxygen saturations,                         blood pressure, adequacy of pulmonary ventilation, and                         response to care were monitored throughout the  procedure. The physical status of the patient was                         re-assessed after the procedure.                        After obtaining informed consent, the colonoscope was                         passed under direct vision. Throughout the procedure,                         the patient's blood pressure, pulse, and oxygen                         saturations were monitored continuously. The                         Colonoscope was introduced through the anus and                         advanced to the the cecum, identified by appendiceal                         orifice and ileocecal valve. The colonoscopy was                         performed  with moderate difficulty due to significant                         looping and the patient's body habitus. Successful                         completion of the procedure was aided by applying                         abdominal pressure. The patient tolerated the                         procedure well. The quality of the bowel preparation                         was adequate to identify polyps greater than 5 mm in                         size. The ileocecal valve, appendiceal orifice, and                         rectum were photographed. Findings:      The perianal and digital rectal examinations were normal. Pertinent       negatives include normal sphincter tone and no palpable rectal lesions.      The entire examined colon appeared normal.      The retroflexed view of the distal rectum and anal verge was normal and       showed no anal or rectal abnormalities. Impression:            - The entire examined colon is normal.                        -  The distal rectum and anal verge are normal on                         retroflexion view.                        - No specimens collected. Recommendation:        - Discharge patient to home (with escort).                        - Resume previous diet today.                        - Continue present medications.                        - Repeat colonoscopy with 2 day prep in 5 years for                         screening purposes. Procedure Code(s):     --- Professional ---                        H9894, Colorectal cancer screening; colonoscopy on                         individual at high risk Diagnosis Code(s):     --- Professional ---                        Z80.0, Family history of malignant neoplasm of                         digestive organs                        Z12.11, Encounter for screening for malignant neoplasm                         of colon CPT copyright 2022 American Medical Association. All rights reserved. The codes documented  in this report are preliminary and upon coder review may  be revised to meet current compliance requirements. Dr. Corinn Brooklyn Corinn Jess Brooklyn MD, MD 06/09/2024 10:14:26 AM This report has been signed electronically. Number of Addenda: 0 Note Initiated On: 06/09/2024 9:31 AM Scope Withdrawal Time: 0 hours 8 minutes 58 seconds  Total Procedure Duration: 0 hours 17 minutes 59 seconds  Estimated Blood Loss:  Estimated blood loss: none.      [facility]

## 2024-06-09 NOTE — H&P (Signed)
 Corinn JONELLE Brooklyn, MD [facility] Gastroenterology, DHIP [address]  [place], [place] [address]  Main: [phone] Fax:  [phone] Pager: [phone]   Primary Care Physician:  Alla Amis, MD Primary Gastroenterologist:  Dr. Corinn JONELLE Brooklyn  Pre-Procedure History & Physical: HPI:  [name] is a 63 y.o. male is here for an colonoscopy.   Past Medical History:  Diagnosis Date   BPH (benign prostatic hyperplasia)    Diabetes mellitus without complication (HCC)    Fatty liver    GERD (gastroesophageal reflux disease)    no meds   History of hiatal hernia    History of polycythemia    Hyperlipidemia    Hypertension    Low testosterone     Medical history non-contributory    Polycythemia    Sleep apnea    uses a cpap nightly    Past Surgical History:  Procedure Laterality Date   BIOPSY  07/09/2023   Procedure: BIOPSY;  Surgeon: Brooklyn Corinn Skiff, MD;  Location: [facility] ENDOSCOPY;  Service: Gastroenterology;;   COLONOSCOPY     COLONOSCOPY WITH PROPOFOL  N/A 07/09/2023   Procedure: COLONOSCOPY WITH PROPOFOL ;  Surgeon: Brooklyn Corinn Skiff, MD;  Location: [facility] ENDOSCOPY;  Service: Gastroenterology;  Laterality: N/A;   HERNIA REPAIR  2021   I & D EXTREMITY Left 06/20/2013   Procedure: MINOR IRRIGATION AND DEBRIDEMENT LEFT INDEX FINGER;  Surgeon: Franky JONELLE Curia, MD;  Location: [facility];  Service: Orthopedics;  Laterality: Left;   NASAL SINUS SURGERY     UPPER GASTROINTESTINAL ENDOSCOPY     WISDOM TOOTH EXTRACTION      Prior to Admission medications   Medication Sig Start Date End Date Taking? Authorizing Provider  acetaminophen  (TYLENOL ) 500 MG tablet Take 1,000 mg by mouth every 8 (eight) hours as needed for moderate pain or headache.    [provider]  albuterol (VENTOLIN HFA) 108 (90 Base) MCG/ACT inhaler Inhale 2 puffs into the lungs every 4 (four) hours as needed for wheezing or shortness of breath.  12/22/19 02/18/23   [provider]  aspirin 81 MG EC tablet Take 162 mg by mouth daily.     [provider]  azelastine (ASTELIN) 0.1 % nasal spray Place 1 spray into both nostrils 2 (two) times daily as needed for allergies.  12/25/19 02/18/23  [provider]  Calcium 250 MG CAPS Take 250 mg by mouth daily.    [provider]  cetirizine (ZYRTEC) 10 MG tablet Take 10 mg by mouth daily as needed for allergies.     [provider]  Cholecalciferol  (DIALYVITE VITAMIN D  5000) 125 MCG (5000 UT) capsule Take 5,000 Units by mouth daily.    [provider]  Coenzyme Q10 (COQ10) 200 MG CAPS Take 200 mg by mouth daily.    [provider]  cyclobenzaprine  (FLEXERIL ) 10 MG tablet Take 1 tablet by mouth every 8 (eight) hours as needed. 09/19/20   [provider]  diphenhydrAMINE  (BENADRYL  ALLERGY) 25 MG tablet Take 1 tablet (25 mg total) by mouth once for 1 dose. Benadryl  1 hour prior to scan 07/24/21 02/18/23  Sherrilee Belvie CROME, MD  glipiZIDE (GLUCOTROL XL) 5 MG 24 hr tablet Take 5 mg by mouth daily with breakfast. 08/25/22   [provider]  guaiFENesin (MUCINEX) 600 MG 12 hr tablet Take 600 mg by mouth every 12 (twelve) hours as needed (congestion).     [provider]  ibuprofen  (ADVIL ) 800 MG tablet Take 1  tablet (800 mg total) by mouth 3 (three) times daily. 03/15/22   Lynwood Lenis, PA-C  lisinopril (ZESTRIL) 20 MG tablet Take 20 mg by mouth daily.  06/26/19   [provider]  lovastatin (MEVACOR) 20 MG tablet Take 20 mg by mouth at bedtime.    [provider]  magnesium  oxide (MAG-OX) 400 MG tablet Take 400 mg by mouth every Monday, Wednesday, and Friday.    [provider]  metFORMIN (GLUCOPHAGE-XR) 500 MG 24 hr tablet Take 500 mg by mouth 2 (two) times daily.    [provider]  montelukast  (SINGULAIR ) 10 MG tablet Take 10 mg by mouth at bedtime.  04/07/20   [provider]  Multiple Vitamin  (MULTIVITAMIN) tablet Take 1 tablet by mouth daily.    [provider]  Potassium 99 MG TABS Take 99 mg by mouth daily.    [provider]  Probiotic Product (PROBIOTIC ADVANCED PO) Take 1 tablet by mouth daily.    [provider]  pseudoephedrine (SUDAFED) 30 MG tablet Take 30 mg by mouth every 4 (four) hours as needed for congestion.     [provider]  silodosin  (RAPAFLO ) 8 MG CAPS capsule Take 1 capsule (8 mg total) by mouth at bedtime. 10/05/22   McKenzie, Belvie CROME, MD  tadalafil  (CIALIS ) 20 MG tablet Take 1 tablet (20 mg total) by mouth daily as needed for erectile dysfunction. 01/30/22   McKenzie, Belvie CROME, MD  testosterone  cypionate (DEPOTESTOSTERONE CYPIONATE) 200 MG/ML injection Inject 0.6 mLs (120 mg total) into the muscle every 7 (seven) days. 11/03/22   McKenzie, Belvie CROME, MD  Vibegron  (GEMTESA ) 75 MG TABS Take 1 capsule by mouth daily. 07/31/22   McKenzie, Belvie CROME, MD  vitamin B-12 (CYANOCOBALAMIN ) 1000 MCG tablet Take 1,000 mcg by mouth daily.    [provider]    Allergies as of 05/09/2024 - Review Complete 07/09/2023  Allergen Reaction Noted   Amlodipine Other (See Comments) 08/09/2015   Ciprofloxacin  Itching 05/02/2020   Contrast media [iodinated contrast media] Itching 03/22/2012   Erythromycin  11/20/2012   Ioxaglate Itching 05/02/2020   Metrizamide Itching 05/02/2020   Codeine Itching 03/22/2012   Hydrocodone -acetaminophen  Itching 08/25/2014   Metformin and related Diarrhea 05/02/2020    Family History  Problem Relation Age of Onset   Heart disease Father    Colon cancer Father     Social History   Socioeconomic History   Marital status: Married    Spouse name: Not on file   Number of children: Not on file   Years of education: Not on file   Highest education level: Not on file  Occupational History   Not on file  Tobacco Use   Smoking status: Never   Smokeless tobacco: Never  Vaping Use   Vaping status:  Never Used  Substance and Sexual Activity   Alcohol use: Yes    Comment: occ   Drug use: No   Sexual activity: Not on file  Other Topics Concern   Not on file  Social History Narrative   Not on file   Social Drivers of Health   Financial Resource Strain: Patient Declined (02/01/2024)   Received from [facility]   Overall Financial Resource Strain (CARDIA)    Difficulty of Paying Living Expenses: Patient declined  Food Insecurity: Patient Declined (02/01/2024)   Received from [facility]   Hunger Vital Sign    Within the past 12 months, you worried that your food  would run out before you got the money to buy more.: Patient declined    Within the past 12 months, the food you bought just didn't last and you didn't have money to get more.: Patient declined  Transportation Needs: No Transportation Needs (02/01/2024)   Received from [facility] - Transportation    In the past 12 months, has lack of transportation kept you from medical appointments or from getting medications?: No    Lack of Transportation (Non-Medical): No  Physical Activity: Not on file  Stress: Not on file (06/30/2023)  Social Connections: Not on file  Intimate Partner Violence: Low Risk  (12/08/2019)   Received from [facility]   Intimate Partner Violence    Insults You: Not on file    Threatens You: Not on file    Screams at You: Not on file    Physically Hurt: Not on file    Intimate Partner Violence Score: Not on file    Review of Systems: See HPI, otherwise negative ROS  Physical Exam: BP 119/84   Pulse 77   Temp (!) 96.7 F (35.9 C) (Temporal)   Resp 18   Ht 6' 3 (1.905 m)   Wt 96.8 kg   SpO2 99%   BMI 26.67 kg/m  General:   Alert,  pleasant and cooperative in NAD Head:  Normocephalic and atraumatic. Neck:  Supple; no masses or thyromegaly. Lungs:  Clear throughout to auscultation.    Heart:  Regular rate and rhythm. Abdomen:   Soft, nontender and nondistended. Normal bowel sounds, without guarding, and without rebound.   Neurologic:  Alert and  oriented x4;  grossly normal neurologically.  Impression/Plan: [name] is here for an colonoscopy to be performed for colon cancer screening  Risks, benefits, limitations, and alternatives regarding  colonoscopy have been reviewed with the patient.  Questions have been answered.  All parties agreeable.   Corinn Brooklyn, MD  06/09/2024, 9:31 AM

## 2024-06-20 ENCOUNTER — Encounter (HOSPITAL_COMMUNITY): Payer: Self-pay | Admitting: Orthopedic Surgery

## 2024-06-20 NOTE — Progress Notes (Signed)
 For Anesthesia: PCP - Alda Carpen, MD  Cardiologist - Perla Evalene PARAS, MD  Pulmonologist- Linard Alm NOVAK, MD   Bowel Prep reminder: N/A  Chest x-ray - greater than 1 year  EKG - greater than 1 year  Stress Test - greater than 2 years  ECHO - 11/01/23 [facility] Cardiac Cath -  N/A Pacemaker/ICD device last checked: N/A Pacemaker orders received: N/A Device Rep notified: N/A  Spinal Cord Stimulator:N/A   Sleep Study - Yes CPAP - Yes  Fasting Blood Sugar - 99-130 Checks Blood Sugar __2___ times a weekly Date and result of last Hgb A1c-01/28/24 (6.4) in [facility]  Last dose of GLP1 agonist- N/A GLP1 instructions: Hold 7 days prior to schedule (Hold 24 hours-daily)   Last dose of SGLT-2 inhibitors- N/A SGLT-2 instructions: Hold 72 hours prior to surgery  Blood Thinner Instructions:N/A Last Dose:N/A Time last taken:N/A  Aspirin Instructions:  Last Dose: 06/20/24 Time last taken: 2100  Activity level:  Able to exercise without chest pain and/or shortness of breath      Anesthesia review: HTN, DM ,OSA  Patient denies shortness of breath, fever, cough and chest pain at PAT appointment   Patient verbalized understanding of instructions that were reviewed over the telephone.

## 2024-06-20 NOTE — Progress Notes (Signed)
 Sent message, via epic in basket, requesting orders in epic from Careers adviser.

## 2024-06-21 ENCOUNTER — Other Ambulatory Visit: Payer: Self-pay

## 2024-06-21 ENCOUNTER — Encounter (HOSPITAL_COMMUNITY): Payer: Self-pay | Admitting: Orthopedic Surgery

## 2024-06-21 NOTE — H&P (Signed)
 Patient's anticipated LOS is less than 2 midnights, meeting these requirements: - Younger than 30 - Lives within 1 hour of care - Has a competent adult at home to recover with post-op recover - NO history of  - Chronic pain requiring opiods  - Diabetes  - Coronary Artery Disease  - Heart failure  - Heart attack  - Stroke  - DVT/VTE  - Cardiac arrhythmia  - Respiratory Failure/COPD  - Renal failure  - Anemia  - Advanced Liver disease     [name] is an 63 y.o. male.    Chief Complaint: right shoulder pain  HPI: Pt is a 63 y.o. male complaining of right shoulder pain for multiple years. Pain had continually increased since the beginning. X-rays in the clinic show rotator cuff tear right shoulder. Pt has tried various conservative treatments which have failed to alleviate their symptoms, including injections and therapy. Various options are discussed with the patient. Risks, benefits and expectations were discussed with the patient. Patient understand the risks, benefits and expectations and wishes to proceed with surgery.   PCP:  Alla Amis, MD  D/C Plans: Home  PMH: Past Medical History:  Diagnosis Date   Arthritis    Back, shoulders, knees   BPH (benign prostatic hyperplasia)    Diabetes mellitus without complication (HCC)    Fatty liver    GERD (gastroesophageal reflux disease)    no meds   History of hiatal hernia    History of kidney stones    History of polycythemia    Hyperlipidemia    Hypertension    Incomplete right bundle branch block (RBBB) determined by electrocardiography    Insomnia    Low testosterone     Medical history non-contributory    OSA on CPAP    Pneumonia    Polycythemia    Small bowel obstruction, partial (HCC)    Wears hearing aid in both ears     PSH: Past Surgical History:  Procedure Laterality Date   BIOPSY  07/09/2023   Procedure: BIOPSY;  Surgeon: Unk Corinn Skiff, MD;  Location: [facility] ENDOSCOPY;  Service:  Gastroenterology;;   COLONOSCOPY     COLONOSCOPY N/A 06/09/2024   Procedure: COLONOSCOPY;  Surgeon: Unk Corinn Skiff, MD;  Location: [facility] ENDOSCOPY;  Service: Gastroenterology;  Laterality: N/A;   COLONOSCOPY WITH PROPOFOL  N/A 07/09/2023   Procedure: COLONOSCOPY WITH PROPOFOL ;  Surgeon: Unk Corinn Skiff, MD;  Location: [facility] ENDOSCOPY;  Service: Gastroenterology;  Laterality: N/A;   HERNIA REPAIR  2021   I & D EXTREMITY Left 06/20/2013   Procedure: MINOR IRRIGATION AND DEBRIDEMENT LEFT INDEX FINGER;  Surgeon: Franky JONELLE Curia, MD;  Location: [facility];  Service: Orthopedics;  Laterality: Left;   NASAL SINUS SURGERY     UPPER GASTROINTESTINAL ENDOSCOPY     WISDOM TOOTH EXTRACTION      Social History:  reports that he has never smoked. He has never used smokeless tobacco. He reports that he does not currently use alcohol. He reports that he does not use drugs. BMI: Estimated body mass index is 28 kg/m as calculated from the following:   Height as of this encounter: 6' 3 (1.905 m).   Weight as of this encounter: 101.6 kg.  Lab Results  Component Value Date   ALBUMIN 4.8 07/23/2022   Diabetes:   Patient has a diagnosis of diabetes,  Lab Results  Component Value Date   HGBA1C 6.4 (H) 05/22/2021   Smoking Status:   reports that he has  never smoked. He has never used smokeless tobacco.    Allergies:  Allergies  Allergen Reactions   Amlodipine Other (See Comments)    edema, edema   Ciprofloxacin  Itching   Contrast Media [Iodinated Contrast Media] Itching    Gets hot   Erythromycin     Stomach pain   Ioxaglate Itching   Metrizamide Itching   Codeine Itching   Hydrocodone -Acetaminophen  Itching   Metformin And Related Diarrhea    The plain metformin caused severe diarrhea but the ER version doesnt    Medications: No current facility-administered medications for this encounter.   Current Outpatient Medications  Medication Sig Dispense Refill   aspirin  81 MG EC tablet Take 162 mg by mouth daily.      meloxicam (MOBIC) 15 MG tablet Take 15 mg by mouth every evening.     acetaminophen  (TYLENOL ) 500 MG tablet Take 1,000 mg by mouth every 8 (eight) hours as needed for moderate pain or headache. (Patient not taking: Reported on 06/21/2024)     albuterol (VENTOLIN HFA) 108 (90 Base) MCG/ACT inhaler Inhale 2 puffs into the lungs every 4 (four) hours as needed for wheezing or shortness of breath.  (Patient not taking: Reported on 06/21/2024)     azelastine (ASTELIN) 0.1 % nasal spray Place 1 spray into both nostrils 2 (two) times daily as needed for allergies.  (Patient not taking: Reported on 06/21/2024)     Calcium 250 MG CAPS Take 250 mg by mouth daily.     cetirizine (ZYRTEC) 10 MG tablet Take 10 mg by mouth every evening.     Cholecalciferol  (DIALYVITE VITAMIN D  5000) 125 MCG (5000 UT) capsule Take 5,000 Units by mouth every evening.     Coenzyme Q10 (COQ10) 200 MG CAPS Take 200 mg by mouth every evening.     cyclobenzaprine  (FLEXERIL ) 10 MG tablet Take 1 tablet by mouth every 8 (eight) hours as needed.     diphenhydrAMINE  (BENADRYL  ALLERGY) 25 MG tablet Take 1 tablet (25 mg total) by mouth once for 1 dose. Benadryl  1 hour prior to scan (Patient not taking: Reported on 06/21/2024) 1 tablet 0   glipiZIDE (GLUCOTROL XL) 5 MG 24 hr tablet Take 5 mg by mouth every evening.     guaiFENesin (MUCINEX) 600 MG 12 hr tablet Take 600 mg by mouth every 12 (twelve) hours as needed (congestion).  (Patient not taking: Reported on 06/21/2024)     ibuprofen  (ADVIL ) 800 MG tablet Take 1 tablet (800 mg total) by mouth 3 (three) times daily. (Patient not taking: Reported on 06/21/2024) 30 tablet 0   lisinopril (ZESTRIL) 20 MG tablet Take 20 mg by mouth every evening.     lovastatin (MEVACOR) 20 MG tablet Take 20 mg by mouth at bedtime.     magnesium  oxide (MAG-OX) 400 MG tablet Take 400 mg by mouth every Monday, Wednesday, and Friday.     metFORMIN (GLUCOPHAGE-XR) 500  MG 24 hr tablet Take 1,500 mg by mouth every evening.     montelukast  (SINGULAIR ) 10 MG tablet Take 10 mg by mouth at bedtime.      Multiple Vitamin (MULTIVITAMIN) tablet Take 1 tablet by mouth daily.     Potassium 99 MG TABS Take 99 mg by mouth daily.     Probiotic Product (PROBIOTIC ADVANCED PO) Take 1 tablet by mouth daily.     pseudoephedrine (SUDAFED) 30 MG tablet Take 30 mg by mouth every 4 (four) hours as needed for congestion.      silodosin  (  RAPAFLO ) 8 MG CAPS capsule Take 1 capsule (8 mg total) by mouth at bedtime. 30 capsule 11   tadalafil  (CIALIS ) 20 MG tablet Take 1 tablet (20 mg total) by mouth daily as needed for erectile dysfunction. (Patient taking differently: Take 10 mg by mouth daily as needed for erectile dysfunction.) 10 tablet 6   testosterone  cypionate (DEPOTESTOSTERONE CYPIONATE) 200 MG/ML injection Inject 0.6 mLs (120 mg total) into the muscle every 7 (seven) days. (Patient taking differently: Inject 120 mg into the muscle every 7 (seven) days. Every Friday) 1 mL 5   Vibegron  (GEMTESA ) 75 MG TABS Take 1 capsule by mouth daily. (Patient taking differently: Take 1 capsule by mouth every evening.) 90 tablet 3   vitamin B-12 (CYANOCOBALAMIN ) 1000 MCG tablet Take 1,000 mcg by mouth daily.      No results found for this or any previous visit (from the past 48 hours). No results found.  ROS: Pain with rom of the right upper extremity  Physical Exam: Alert and oriented 63 y.o. male in no acute distress Cranial nerves 2-12 intact Cervical spine: full rom with no tenderness, nv intact distally Chest: active breath sounds bilaterally, no wheeze rhonchi or rales Heart: regular rate and rhythm, no murmur Abd: non tender non distended with active bowel sounds Hip is stable with rom  Right shoulder painful and weak rom Nv intact distally No rashes or edema  Assessment/Plan Assessment: right shoulder rotator cuff tear  Plan:  Patient will undergo a right shoulder rotator  cuff repair by Dr. Kay at [place] Risks benefits and expectations were discussed with the patient. Patient understand risks, benefits and expectations and wishes to proceed. Preoperative templating of the joint replacement has been completed, documented, and submitted to the Operating Room personnel in order to optimize intra-operative equipment management.   Arvella Fireman PA-C, MPAS [facility] is now [employer] [address], [place], [place] [address] Phone: [phone] [email or web address] Facebook  [employer]

## 2024-06-23 ENCOUNTER — Ambulatory Visit (HOSPITAL_COMMUNITY): Payer: Worker's Compensation | Admitting: Physician Assistant

## 2024-06-23 ENCOUNTER — Ambulatory Visit (HOSPITAL_COMMUNITY)
Admission: RE | Admit: 2024-06-23 | Discharge: 2024-06-23 | Disposition: A | Discharge status: Home / Self Care | Payer: Worker's Compensation | Attending: Orthopedic Surgery | Admitting: Orthopedic Surgery

## 2024-06-23 ENCOUNTER — Ambulatory Visit (HOSPITAL_COMMUNITY): Payer: Self-pay | Admitting: Physician Assistant

## 2024-06-23 ENCOUNTER — Encounter: Admission: RE | Disposition: A | Payer: Self-pay | Attending: Orthopedic Surgery

## 2024-06-23 ENCOUNTER — Other Ambulatory Visit: Payer: Self-pay

## 2024-06-23 ENCOUNTER — Encounter (HOSPITAL_COMMUNITY): Payer: Self-pay | Admitting: Orthopedic Surgery

## 2024-06-23 DIAGNOSIS — I10 Essential (primary) hypertension: Secondary | ICD-10-CM

## 2024-06-23 DIAGNOSIS — X58XXXA Exposure to other specified factors, initial encounter: Secondary | ICD-10-CM | POA: Insufficient documentation

## 2024-06-23 DIAGNOSIS — M75101 Unspecified rotator cuff tear or rupture of right shoulder, not specified as traumatic: Secondary | ICD-10-CM | POA: Diagnosis not present

## 2024-06-23 DIAGNOSIS — S43431A Superior glenoid labrum lesion of right shoulder, initial encounter: Secondary | ICD-10-CM

## 2024-06-23 DIAGNOSIS — G4730 Sleep apnea, unspecified: Secondary | ICD-10-CM | POA: Insufficient documentation

## 2024-06-23 DIAGNOSIS — S43431D Superior glenoid labrum lesion of right shoulder, subsequent encounter: Secondary | ICD-10-CM | POA: Diagnosis present

## 2024-06-23 DIAGNOSIS — K219 Gastro-esophageal reflux disease without esophagitis: Secondary | ICD-10-CM | POA: Insufficient documentation

## 2024-06-23 DIAGNOSIS — M19011 Primary osteoarthritis, right shoulder: Secondary | ICD-10-CM | POA: Diagnosis not present

## 2024-06-23 DIAGNOSIS — E119 Type 2 diabetes mellitus without complications: Secondary | ICD-10-CM

## 2024-06-23 HISTORY — DX: Obstructive sleep apnea (adult) (pediatric): G47.33

## 2024-06-23 HISTORY — PX: SHOULDER OPEN ROTATOR CUFF REPAIR: SHX2407

## 2024-06-23 HISTORY — DX: Personal history of urinary calculi: Z87.442

## 2024-06-23 HISTORY — DX: Partial intestinal obstruction, unspecified as to cause: K56.600

## 2024-06-23 HISTORY — DX: Unspecified osteoarthritis, unspecified site: M19.90

## 2024-06-23 HISTORY — DX: Pneumonia, unspecified organism: J18.9

## 2024-06-23 HISTORY — DX: Insomnia, unspecified: G47.00

## 2024-06-23 HISTORY — DX: Unspecified right bundle-branch block: I45.10

## 2024-06-23 HISTORY — PX: POSTERIOR LUMBAR FUSION 2 WITH HARDWARE REMOVAL: SHX7297

## 2024-06-23 HISTORY — DX: Presence of external hearing-aid: Z97.4

## 2024-06-23 LAB — BASIC METABOLIC PANEL WITH GFR
Anion gap: 13 (ref 5–15)
BUN: 16 mg/dL (ref 8–23)
CO2: 26 mmol/L (ref 22–32)
Calcium: 9.4 mg/dL (ref 8.9–10.3)
Chloride: 102 mmol/L (ref 98–111)
Creatinine, Ser: 0.94 mg/dL (ref 0.61–1.24)
GFR, Estimated: >60 mL/min (ref >60)
Glucose, Bld: 134 mg/dL — ABNORMAL HIGH (ref 70–99)
Potassium: 4 mmol/L (ref 3.5–5.1)
Sodium: 141 mmol/L (ref 135–145)

## 2024-06-23 LAB — CBC
HCT: 52.6 % — ABNORMAL HIGH (ref 39.0–52.0)
Hemoglobin: 18 g/dL — ABNORMAL HIGH (ref 13.0–17.0)
MCH: 30 pg (ref 26.0–34.0)
MCHC: 34.2 g/dL (ref 30.0–36.0)
MCV: 87.5 fL (ref 80.0–100.0)
Platelets: 171 K/uL (ref 150–400)
RBC: 6.01 MIL/uL — ABNORMAL HIGH (ref 4.22–5.81)
RDW: 14.2 % (ref 11.5–15.5)
WBC: 6.4 K/uL (ref 4.0–10.5)
nRBC: 0 % (ref 0.0–0.2)

## 2024-06-23 LAB — GLUCOSE, CAPILLARY
Glucose-Capillary: 154 mg/dL — ABNORMAL HIGH (ref 70–99)
Glucose-Capillary: 145 mg/dL — ABNORMAL HIGH (ref 70–99)

## 2024-06-23 SURGERY — ARTHROSCOPY, SHOULDER WITH DEBRIDEMENT
Anesthesia: Regional | Site: Shoulder | Laterality: Right

## 2024-06-23 MED ORDER — PROPOFOL 10 MG/ML IV BOLUS
INTRAVENOUS | Status: AC
  Filled 2024-06-23: qty 20

## 2024-06-23 MED ORDER — MIDAZOLAM HCL (PF) 2 MG/2ML IJ SOLN
1.0000 mg | Freq: Once | INTRAMUSCULAR | Status: AC
  Administered 2024-06-23: 1 mg via INTRAVENOUS
  Filled 2024-06-23: qty 2

## 2024-06-23 MED ORDER — ONDANSETRON HCL 4 MG/2ML IJ SOLN
INTRAMUSCULAR | Status: AC
  Filled 2024-06-23: qty 2

## 2024-06-23 MED ORDER — CYCLOBENZAPRINE HCL 10 MG PO TABS: 10.0000 mg | ORAL_TABLET | Freq: Three times a day (TID) | ORAL | 0 refills | Status: AC | PRN

## 2024-06-23 MED ORDER — DEXAMETHASONE SOD PHOSPHATE PF 10 MG/ML IJ SOLN
INTRAMUSCULAR | Status: DC | PRN
  Administered 2024-06-23: 5 mg via INTRAVENOUS

## 2024-06-23 MED ORDER — CEFAZOLIN SODIUM-DEXTROSE 2-4 GM/100ML-% IV SOLN
2.0000 g | INTRAVENOUS | Status: AC
  Administered 2024-06-23: 2 g via INTRAVENOUS
  Filled 2024-06-23: qty 100

## 2024-06-23 MED ORDER — PHENYLEPHRINE HCL-NACL 20-0.9 MG/250ML-% IV SOLN
INTRAVENOUS | Status: DC | PRN
  Administered 2024-06-23: 80 ug/min via INTRAVENOUS
  Administered 2024-06-23: 100 ug/min via INTRAVENOUS

## 2024-06-23 MED ORDER — BUPIVACAINE-EPINEPHRINE (PF) 0.25% -1:200000 IJ SOLN
INTRAMUSCULAR | Status: AC
  Filled 2024-06-23: qty 30

## 2024-06-23 MED ORDER — BUPIVACAINE LIPOSOME 1.3 % IJ SUSP
INTRAMUSCULAR | Status: DC | PRN
  Administered 2024-06-23: 10 mL via PERINEURAL

## 2024-06-23 MED ORDER — ROCURONIUM BROMIDE 10 MG/ML (PF) SYRINGE
PREFILLED_SYRINGE | INTRAVENOUS | Status: AC
  Filled 2024-06-23: qty 10

## 2024-06-23 MED ORDER — 0.9 % SODIUM CHLORIDE (POUR BTL) OPTIME
TOPICAL | Status: DC | PRN
  Administered 2024-06-23: 1000 mL

## 2024-06-23 MED ORDER — ORAL CARE MOUTH RINSE: 15.0000 mL | Freq: Once | OROMUCOSAL | Status: AC

## 2024-06-23 MED ORDER — SODIUM CHLORIDE 0.9 % IR SOLN
Status: DC | PRN
  Administered 2024-06-23: 6000 mL

## 2024-06-23 MED ORDER — FENTANYL CITRATE (PF) 100 MCG/2ML IJ SOLN
INTRAMUSCULAR | Status: AC
  Filled 2024-06-23: qty 2

## 2024-06-23 MED ORDER — TRAMADOL HCL 50 MG PO TABS: 50.0000 mg | ORAL_TABLET | Freq: Four times a day (QID) | ORAL | 0 refills | Status: AC | PRN

## 2024-06-23 MED ORDER — INSULIN ASPART 100 UNIT/ML IJ SOLN
0.0000 [IU] | INTRAMUSCULAR | Status: DC | PRN
  Administered 2024-06-23: 2 [IU] via SUBCUTANEOUS
  Filled 2024-06-23: qty 1

## 2024-06-23 MED ORDER — HYDROMORPHONE HCL 2 MG PO TABS: 2.0000 mg | ORAL_TABLET | Freq: Four times a day (QID) | ORAL | 0 refills | Status: AC | PRN

## 2024-06-23 MED ORDER — PROPOFOL 10 MG/ML IV BOLUS
INTRAVENOUS | Status: DC | PRN
  Administered 2024-06-23: 150 mg via INTRAVENOUS

## 2024-06-23 MED ORDER — LACTATED RINGERS IV SOLN: INTRAVENOUS | Status: DC

## 2024-06-23 MED ORDER — SUGAMMADEX SODIUM 200 MG/2ML IV SOLN
INTRAVENOUS | Status: DC | PRN
  Administered 2024-06-23: 200 mg via INTRAVENOUS

## 2024-06-23 MED ORDER — ROCURONIUM BROMIDE 100 MG/10ML IV SOLN
INTRAVENOUS | Status: DC | PRN
  Administered 2024-06-23: 50 mg via INTRAVENOUS

## 2024-06-23 MED ORDER — SUGAMMADEX SODIUM 200 MG/2ML IV SOLN
INTRAVENOUS | Status: AC
  Filled 2024-06-23: qty 2

## 2024-06-23 MED ORDER — OXYCODONE HCL 5 MG PO TABS: 5.0000 mg | ORAL_TABLET | Freq: Once | ORAL | Status: DC | PRN

## 2024-06-23 MED ORDER — BUPIVACAINE-EPINEPHRINE 0.5% -1:200000 IJ SOLN
INTRAMUSCULAR | Status: DC | PRN
  Administered 2024-06-23: 8 mL

## 2024-06-23 MED ORDER — DROPERIDOL 2.5 MG/ML IJ SOLN: 0.6250 mg | Freq: Once | INTRAMUSCULAR | Status: DC | PRN

## 2024-06-23 MED ORDER — TRANEXAMIC ACID-NACL 1000-0.7 MG/100ML-% IV SOLN
1000.0000 mg | INTRAVENOUS | Status: DC
  Filled 2024-06-23: qty 100

## 2024-06-23 MED ORDER — BUPIVACAINE HCL (PF) 0.5 % IJ SOLN
INTRAMUSCULAR | Status: DC | PRN
  Administered 2024-06-23: 10 mL via PERINEURAL

## 2024-06-23 MED ORDER — CHLORHEXIDINE GLUCONATE 0.12 % MT SOLN
15.0000 mL | Freq: Once | OROMUCOSAL | Status: AC
  Administered 2024-06-23: 15 mL via OROMUCOSAL

## 2024-06-23 MED ORDER — FENTANYL CITRATE (PF) 50 MCG/ML IJ SOSY
50.0000 ug | PREFILLED_SYRINGE | Freq: Once | INTRAMUSCULAR | Status: AC
  Administered 2024-06-23: 50 ug via INTRAVENOUS
  Filled 2024-06-23: qty 2

## 2024-06-23 MED ORDER — FENTANYL CITRATE (PF) 100 MCG/2ML IJ SOLN
INTRAMUSCULAR | Status: DC | PRN
  Administered 2024-06-23: 75 ug via INTRAVENOUS
  Administered 2024-06-23: 25 ug via INTRAVENOUS

## 2024-06-23 MED ORDER — FENTANYL CITRATE (PF) 50 MCG/ML IJ SOSY: 25.0000 ug | PREFILLED_SYRINGE | INTRAMUSCULAR | Status: DC | PRN

## 2024-06-23 MED ORDER — OXYCODONE HCL 5 MG/5ML PO SOLN: 5.0000 mg | Freq: Once | ORAL | Status: DC | PRN

## 2024-06-23 MED ORDER — ONDANSETRON HCL 4 MG/2ML IJ SOLN
INTRAMUSCULAR | Status: DC | PRN
  Administered 2024-06-23: 4 mg via INTRAVENOUS

## 2024-06-23 SURGICAL SUPPLY — 38 items
ANCHOR ALL-SUT FLEX 1.3 Y-KNOT (Anchor) IMPLANT
ANCHOR ALL-SUT RC W2 1.3 RIB (Anchor) IMPLANT
BAG COUNTER SPONGE SURGICOUNT (BAG) IMPLANT
BAG ZIPLOCK 12X15 (MISCELLANEOUS) ×2 IMPLANT
BIT DRILL 1.3M DISPOSABLE (BIT) IMPLANT
COVER SURGICAL LIGHT HANDLE (MISCELLANEOUS) ×2 IMPLANT
DRAPE SHEET LG 3/4 BI-LAMINATE (DRAPES) ×2 IMPLANT
DRAPE SURG 17X11 SM STRL (DRAPES) ×2 IMPLANT
DRAPE SURG ORHT 6 SPLT 77X108 (DRAPES) ×4 IMPLANT
DRAPE U-SHAPE 47X51 STRL (DRAPES) ×2 IMPLANT
DRSG EMULSION OIL 3X3 NADH (GAUZE/BANDAGES/DRESSINGS) ×2 IMPLANT
DURAPREP 26ML APPLICATOR (WOUND CARE) ×2 IMPLANT
ELECT NDL TIP 2.8 STRL (NEEDLE) ×2 IMPLANT
ELECT NEEDLE TIP 2.8 STRL (NEEDLE) ×2 IMPLANT
ELECT REM PT RETURN 15FT ADLT (MISCELLANEOUS) ×2 IMPLANT
GAUZE PAD ABD 8X10 STRL (GAUZE/BANDAGES/DRESSINGS) IMPLANT
GAUZE SPONGE 4X4 12PLY STRL (GAUZE/BANDAGES/DRESSINGS) IMPLANT
GLOVE BIOGEL PI IND STRL 7.5 (GLOVE) ×2 IMPLANT
GLOVE BIOGEL PI IND STRL 8.5 (GLOVE) ×2 IMPLANT
GLOVE ORTHO TXT STRL SZ7.5 (GLOVE) ×2 IMPLANT
GLOVE SURG ORTHO 8.5 STRL (GLOVE) ×2 IMPLANT
KIT BASIN OR (CUSTOM PROCEDURE TRAY) ×2 IMPLANT
KIT TURNOVER KIT A (KITS) ×2 IMPLANT
MANIFOLD NEPTUNE II (INSTRUMENTS) ×2 IMPLANT
PACK SHOULDER (CUSTOM PROCEDURE TRAY) ×2 IMPLANT
PASSER SUT SWANSON 36MM LOOP (INSTRUMENTS) IMPLANT
RESTRAINT HEAD UNIVERSAL NS (MISCELLANEOUS) IMPLANT
SLING ARM IMMOBILIZER LRG (SOFTGOODS) IMPLANT
SLING ARM IMMOBILIZER MED (SOFTGOODS) IMPLANT
SLING ULTRA II L (ORTHOPEDIC SUPPLIES) IMPLANT
STRIP CLOSURE SKIN 1/2X4 (GAUZE/BANDAGES/DRESSINGS) ×2 IMPLANT
SUT BONE WAX W31G (SUTURE) ×2 IMPLANT
SUT MNCRL AB 4-0 PS2 18 (SUTURE) ×2 IMPLANT
SUT VIC AB 0 CT1 36 (SUTURE) ×2 IMPLANT
SUT VIC AB 0 CT2 27 (SUTURE) ×4 IMPLANT
SUT VIC AB 2-0 CT1 TAPERPNT 27 (SUTURE) ×4 IMPLANT
TOWEL OR 17X26 10 PK STRL BLUE (TOWEL DISPOSABLE) ×2 IMPLANT
WAND ABLATOR APOLLO I90 (BUR) IMPLANT

## 2024-06-23 NOTE — Anesthesia Postprocedure Evaluation (Signed)
 Anesthesia Post Note  Patient: WILFRID HYSER  Procedure(s) Performed: ARTHROSCOPY, SHOULDER WITH DEBRIDEMENT (Right) REPAIR, ROTATOR CUFF, OPEN (Right: Shoulder)     Patient location during evaluation: PACU Anesthesia Type: Regional and General Level of consciousness: awake and alert Pain management: pain level controlled Vital Signs Assessment: post-procedure vital signs reviewed and stable Respiratory status: spontaneous breathing, nonlabored ventilation, respiratory function stable and patient connected to nasal cannula oxygen Cardiovascular status: blood pressure returned to baseline and stable Postop Assessment: no apparent nausea or vomiting Anesthetic complications: no   No notable events documented.  Last Vitals:  Vitals:   06/23/24 1230 06/23/24 1245  BP: 127/81 126/84  Pulse: (!) 103 93  Resp: 17 12  Temp:    SpO2: 93% 92%    Last Pain:  Vitals:   06/23/24 1245  TempSrc:   PainSc: 2                  Thom JONELLE Peoples

## 2024-06-23 NOTE — Anesthesia Preprocedure Evaluation (Signed)
 Anesthesia Evaluation  Patient identified by MRN, date of birth, ID band Patient awake    Reviewed: Allergy & Precautions, H&P , NPO status , Patient's Chart, lab work & pertinent test results  History of Anesthesia Complications Negative for: history of anesthetic complications  Airway Mallampati: II  TM Distance: >3 FB Neck ROM: Full    Dental no notable dental hx.    Pulmonary sleep apnea    Pulmonary exam normal breath sounds clear to auscultation       Cardiovascular hypertension, (-) angina (-) Past MI Normal cardiovascular exam Rhythm:Regular Rate:Normal     Neuro/Psych neg Seizures negative neurological ROS  negative psych ROS   GI/Hepatic Neg liver ROS, hiatal hernia,GERD  ,,  Endo/Other  diabetes, Type 2    Renal/GU Renal diseasenegative Renal ROS  negative genitourinary   Musculoskeletal negative musculoskeletal ROS (+) Arthritis ,    Abdominal   Peds negative pediatric ROS (+)  Hematology negative hematology ROS (+)   Anesthesia Other Findings   Reproductive/Obstetrics negative OB ROS                              Anesthesia Physical Anesthesia Plan  ASA: 3  Anesthesia Plan: General and Regional   Post-op Pain Management: Regional block*   Induction: Intravenous  PONV Risk Score and Plan: 2 and Ondansetron  and Dexamethasone   Airway Management Planned: Oral ETT  Additional Equipment: None  Intra-op Plan:   Post-operative Plan: Extubation in OR  Informed Consent: I have reviewed the patients History and Physical, chart, labs and discussed the procedure including the risks, benefits and alternatives for the proposed anesthesia with the patient or authorized representative who has indicated his/her understanding and acceptance.       Plan Discussed with: CRNA  Anesthesia Plan Comments:          Anesthesia Quick Evaluation

## 2024-06-23 NOTE — Interval H&P Note (Signed)
 History and Physical Interval Note:  06/23/2024 9:43 AM  [name]  has presented today for surgery, with the diagnosis of Superior glenoid labrum lesion of right shoulder, subsequent encounter.  The various methods of treatment have been discussed with the patient and family. After consideration of risks, benefits and other options for treatment, the patient has consented to  Procedure(s) with comments: ARTHROSCOPY, SHOULDER WITH DEBRIDEMENT (Right) - Shoulder arthroscopy with subacromial decompression. REPAIR, ROTATOR CUFF, OPEN (Right) - Open rotator cuff repair, distal clavicle excision, and biceps tenodesis. as a surgical intervention.  The patient's history has been reviewed, patient examined, no change in status, stable for surgery.  I have reviewed the patient's chart and labs.  Questions were answered to the patient's satisfaction.     [name]

## 2024-06-23 NOTE — Brief Op Note (Signed)
 06/23/2024  12:17 PM  PATIENT:  Dorise FORBES Gelineau  63 y.o. male  PRE-OPERATIVE DIAGNOSIS:  Superior glenoid labrum lesion of right shoulder, subsequent encounter, rotator cuff tear right, AC joint DJD right  POST-OPERATIVE DIAGNOSIS:  Superior glenoid labrum lesion of right shoulder, subsequent encounter, rotator cuff tear right, AC DJD right  PROCEDURE:  Procedure(s) with comments: ARTHROSCOPY, SHOULDER WITH DEBRIDEMENT (Right) - Shoulder arthroscopy with subacromial decompression. REPAIR, ROTATOR CUFF, OPEN (Right) - Open rotator cuff repair, distal clavicle excision, and biceps tenodesis.  SURGEON:  Surgeons and Role:    DEWAINE Kay Kemps, MD - Primary  PHYSICIAN ASSISTANT:   ASSISTANTS: Debby KATHEE Fireman, PA-C   ANESTHESIA:   regional and general  EBL:  25 mL   BLOOD ADMINISTERED:none  DRAINS: none   LOCAL MEDICATIONS USED:  MARCAINE      SPECIMEN:  No Specimen  DISPOSITION OF SPECIMEN:  N/A  COUNTS:  YES  TOURNIQUET:  * No tourniquets in log *  DICTATION: .Other Dictation: Dictation Number [id number]  PLAN OF CARE: Discharge to home after PACU  PATIENT DISPOSITION:  PACU - hemodynamically stable.   Delay start of Pharmacological VTE agent (>24hrs) due to surgical blood loss or risk of bleeding: not applicable

## 2024-06-23 NOTE — Op Note (Signed)
 [name], [name] MEDICAL RECORD NO: [id number] ACCOUNT NO: [id number] DATE OF BIRTH: 05/04/1961 FACILITY: [facility] LOCATION: [facility] PHYSICIAN: Elspeth SAUNDERS. Kay, MD  Operative Report   DATE OF PROCEDURE: 06/23/2024  PREOPERATIVE DIAGNOSES:  Right shoulder rotator cuff tear, SLAP tear and AC arthritis.  POSTOPERATIVE DIAGNOSES:  Right shoulder rotator cuff tear, SLAP tear and AC arthritis.  PROCEDURE PERFORMED: 1.  Right shoulder arthroscopy with extensive intra-articular debridement of torn superior labrum anterior to posterior with arthroscopic biceps tenotomy 2.  Arthroscopic subacromial decompression. 3.  Mini-open rotator cuff repair. 4.  Open biceps tenodesis in the groove. 5.  Open distal clavicle resection.  ATTENDING SURGEON:  Elspeth SAUNDERS. Kay, MD  ASSISTANT:  Debby Crock [place], [place], who was scrubbed during the entirety of the surgery, and necessary for satisfactory completion of surgery.  ANESTHESIA:  General anesthesia was used plus interscalene block.  ESTIMATED BLOOD LOSS:  Minimal.  FLUID REPLACEMENT:  1000 mL crystalloid.  COUNTS:  Instrument count was correct.  COMPLICATIONS:  There were no complications.  ANTIBIOTICS:  Perioperative antibiotics were given.  INDICATIONS:  The patient is a 63 year old male who presents with a history of worsening shoulder pain on the right secondary to a rotator cuff tear, SLAP tear, and AC arthritis.  The patient has failed conservative management course consisting of  injections and therapy and presents for operative shoulder arthroscopy and repair.  Informed consent obtained.  DESCRIPTION OF PROCEDURE:  After an adequate level of anesthesia was achieved, the patient was positioned in the modified beach chair position.  The right shoulder was correctly identified and sterile prep and drape performed.  Timeout called verifying  correct patient and correct site.  We entered the patient's shoulder using standard arthroscopic  portals including anterior, posterior, and lateral portals.  We entered the shoulder and identified significant tearing of the intra-articular portion of the  biceps.  The biceps tendon was split.  We released the biceps using baskets with a tenotomy.  The remainder of the labrum looked good except anterior-inferior, which was damaged.  We debrided that back to stable labral tissue.  The articular cartilage  looked good in the shoulder both on the humeral side and the glenoid side.  The subscapularis was basically intact with some subtle undersurface tearing.  There was a significant apparent full-thickness tear in the supraspinatus tendon.  The  infraspinatus and teres minor were intact.  We reversed the scope and looked posteriorly verifying the posterior labrum looked good and the posterior cuff.  We then placed the scope in the subacromial space.  The bursectomy and acromioplasty was  performed, creating a type 1 acromial shape with a high-speed burr using butcher block technique.  With the acromioplasty completed and the decompression completed, we did release the CA ligament to further decompress the outlet for the cuff.  We could  easily visualize the significant full-thickness tear from the bursal side on the supraspinatus.  At this point, we concluded the arthroscopic portion of the procedure.  We then made a small saber incision overlying the AC joint.  Dissection down through  the subcutaneous tissues using Bovie.  Subperiosteal dissection of the distal clavicle was performed splitting the deltotrapezial fascia in line with its fibers and then doing the subperiosteal dissection.  Protecting surrounding soft tissue, we used an  oscillating saw and removed the distal 2-3 mm of bone.  This decompressed the [facility] joint nicely.  We then checked to make sure anterior and posterior AC ligaments were intact, which  they were.  We irrigated thoroughly.  We then applied bone wax to the cut  of the clavicle.   Next, we repaired the deltotrapezial fascia anatomically with 0 Vicryl suture followed by 2-0 Vicryl for subcutaneous closure and 4-0 Monocryl for skin.  We then addressed the rotator cuff and the biceps through a single mini-open  incision starting at the anterolateral border of the acromion and extending distally about 4 cm in the raphe between the anterior and the lateral heads of the deltoid.  Dissection down through subcutaneous tissues.  A needle-tip Bovie was used to divide  the deltoid.  We placed our Arthrex retractor, identified the biceps groove.  We delivered the biceps tendon out of the biceps sheath.  We whipstitched out with #2 Hi-Fi suture to reinforce the tendon.  We then prepped the floor of the biceps groove with  the Therapist, nutritional and the Bovie.  Once we got down to bleeding bone, we placed a single Y-Knot Flex anchor through the floor of the biceps groove and brought that suture up in a mattress fashion through the reinforced tendon, tying that down flush to  the tunnel.  We took the longitudinal whipstitch sutures up through the rotator interval and tied those over a soft tissue bridge.  We then oversewed the soft tissue at the top of the repair with 0 Vicryl figure-of-eight.  We had a nice low-profile  double-fixed repair.  We then went to the cuff.  We mobilized the tendon with #2 Hi-Fi suture in the free edge of the tendon full thickness and used a Cobb elevator on the underside and on the superficial side to free up the tendon.  We used a curette  and rongeur to get the tuberosity prepared for repair.  We then placed a single Y-Knot RC anchor double-loaded ribbon at the medial part of the footprint and brought that up in a double mattress fashion, restore the medial part of the footprint.  We took  the lateral sutures down through drill holes in the bone for double row repair.  We had a nice watertight repair under no tension with the patient's arm at his side.  We irrigated again.   We then repaired the deltoid anatomically with 0 Vicryl suture  followed by 2-0 Vicryl for subcutaneous closure and 4-0 running Monocryl for the skin.  Steri-Strips were applied followed by a sterile dressing.  The patient tolerated the surgery well.   PUS D: 06/23/2024 12:23:46 pm T: 06/23/2024 1:20:00 pm  JOB: [id number]/ [id number]

## 2024-06-23 NOTE — Transfer of Care (Signed)
 Immediate Anesthesia Transfer of Care Note  Patient: [name]  Procedure(s) Performed: ARTHROSCOPY, SHOULDER WITH DEBRIDEMENT (Right) REPAIR, ROTATOR CUFF, OPEN (Right: Shoulder)  Patient Location: PACU  Anesthesia Type:General and Regional  Level of Consciousness: awake and patient cooperative  Airway & Oxygen Therapy: Patient Spontanous Breathing and Patient connected to face mask oxygen  Post-op Assessment: Report given to RN and Post -op Vital signs reviewed and stable  Post vital signs: Reviewed and stable  Last Vitals:  Vitals Value Taken Time  BP 129/83 06/23/24 12:15  Temp 36.4 C 06/23/24 12:12  Pulse 100 06/23/24 12:18  Resp 17 06/23/24 12:18  SpO2 98 % 06/23/24 12:18  Vitals shown include unfiled device data.  Last Pain:  Vitals:   06/23/24 1212  TempSrc:   PainSc: 0-No pain      Patients Stated Pain Goal: 3 (06/21/24 1110)  Complications: No notable events documented.

## 2024-06-23 NOTE — Anesthesia Procedure Notes (Signed)
 Procedure Name: Intubation Date/Time: 06/23/2024 10:13 AM  Performed by: Vincenzo Show, CRNAPre-anesthesia Checklist: Patient identified, Emergency Drugs available, Suction available and Patient being monitored Patient Re-evaluated:Patient Re-evaluated prior to induction Oxygen Delivery Method: Circle system utilized Preoxygenation: Pre-oxygenation with 100% oxygen Induction Type: IV induction Ventilation: Mask ventilation without difficulty and Oral airway inserted - appropriate to patient size Laryngoscope Size: Mac and 4 Grade View: Grade I Tube type: Oral Tube size: 7.5 mm Number of attempts: 1 Airway Equipment and Method: Stylet Placement Confirmation: ETT inserted through vocal cords under direct vision, positive ETCO2, CO2 detector and breath sounds checked- equal and bilateral Secured at: 23 cm Tube secured with: Tape Dental Injury: Teeth and Oropharynx as per pre-operative assessment  Comments: Performed by Recardo Darrel Ahle

## 2024-06-23 NOTE — Discharge Instructions (Signed)
 Ice to the shoulder constantly.  Keep the incision covered and clean and dry until Monday, then remove the bandage and leave open to air, then ok to get it wet in the shower on Wednesday  Do exercise as instructed several times per day. Pendulums - dangle your arm in circles leaning over your right arm and making circles with your hand' Pillow slides - with your arm resting on a pillow, slide forward and back Gentle rotation- with pillow under the arm and elbow bent at 90 degrees rotate your arm in to your stomach and out away from your body like a gate opening and closing  DO NOT reach behind your back or push up out of a chair with the operative arm.  Use a sling while you are up and around for comfort, may remove while seated.  Keep pillow propped behind the operative elbow.  Follow up with Dr Kay in two weeks in the office, call [phone] for appt  Call Dr Kay (cell) [phone] with any questions or concerns

## 2024-06-23 NOTE — Anesthesia Procedure Notes (Signed)
 Anesthesia Regional Block: Interscalene brachial plexus block   Pre-Anesthetic Checklist: , timeout performed,  Correct Patient, Correct Site, Correct Laterality,  Correct Procedure, Correct Position, site marked,  Risks and benefits discussed,  Surgical consent,  Pre-op evaluation,  At surgeon's request and post-op pain management  Laterality: Right  Prep: chloraprep       Needles:  Injection technique: Single-shot  Needle Type: Echogenic Stimulator Needle     Needle Length: 9cm  Needle Gauge: 21     Additional Needles:   Procedures:,,,, ultrasound used (permanent image in chart),,    Narrative:  Start time: 06/23/2024 9:05 AM End time: 06/23/2024 9:10 AM Injection made incrementally with aspirations every 5 mL.  Performed by: Personally  Anesthesiologist: Erma Thom SAUNDERS, MD  Additional Notes: Discussed risks and benefits of the nerve block in detail, including but not limited vascular injury, permanent nerve damage and infection.   Patient tolerated the procedure well. Local anesthetic introduced in an incremental fashion under minimal resistance after negative aspirations. No paresthesias were elicited. After completion of the procedure, no acute issues were identified and patient continued to be monitored by RN.

## 2024-06-26 ENCOUNTER — Encounter (HOSPITAL_COMMUNITY): Payer: Self-pay | Admitting: Orthopedic Surgery

## 2024-08-29 ENCOUNTER — Encounter (HOSPITAL_COMMUNITY): Payer: Self-pay | Admitting: Orthopedic Surgery
